# Patient Record
Sex: Male | Born: 1950 | Race: Black or African American | Hispanic: No | Smoking: Former smoker
Health system: Southern US, Community
[De-identification: ages and names within clinical notes are randomized; demographics above are authoritative.]

## PROBLEM LIST (undated history)

## (undated) DIAGNOSIS — E119 Type 2 diabetes mellitus without complications: Secondary | ICD-10-CM

## (undated) DIAGNOSIS — I2510 Atherosclerotic heart disease of native coronary artery without angina pectoris: Secondary | ICD-10-CM

## (undated) DIAGNOSIS — R519 Headache, unspecified: Secondary | ICD-10-CM

## (undated) DIAGNOSIS — R0600 Dyspnea, unspecified: Secondary | ICD-10-CM

## (undated) DIAGNOSIS — E785 Hyperlipidemia, unspecified: Secondary | ICD-10-CM

## (undated) DIAGNOSIS — I10 Essential (primary) hypertension: Secondary | ICD-10-CM

## (undated) DIAGNOSIS — J449 Chronic obstructive pulmonary disease, unspecified: Secondary | ICD-10-CM

## (undated) HISTORY — PX: COLONOSCOPY: SHX174

## (undated) HISTORY — PX: CARDIAC CATHETERIZATION: SHX172

## (undated) MED FILL — Dexamethasone Sodium Phosphate Inj 100 MG/10ML: INTRAMUSCULAR | Qty: 1 | Status: AC

---

## 2007-01-15 ENCOUNTER — Emergency Department: Payer: Self-pay | Admitting: General Practice

## 2007-04-15 ENCOUNTER — Ambulatory Visit: Payer: Self-pay | Admitting: Gastroenterology

## 2010-12-12 ENCOUNTER — Ambulatory Visit: Payer: Self-pay | Admitting: Cardiovascular Disease

## 2011-03-15 ENCOUNTER — Ambulatory Visit: Payer: Self-pay | Admitting: Family Medicine

## 2011-05-01 ENCOUNTER — Ambulatory Visit: Payer: Self-pay | Admitting: Family Medicine

## 2017-07-21 ENCOUNTER — Other Ambulatory Visit: Payer: Self-pay

## 2017-07-21 ENCOUNTER — Encounter: Payer: Self-pay | Admitting: *Deleted

## 2017-07-21 ENCOUNTER — Emergency Department
Admission: EM | Admit: 2017-07-21 | Discharge: 2017-07-21 | Disposition: A | Discharge status: Home / Self Care | Payer: BLUE CROSS/BLUE SHIELD | Attending: Emergency Medicine | Admitting: Emergency Medicine

## 2017-07-21 ENCOUNTER — Emergency Department: Payer: BLUE CROSS/BLUE SHIELD

## 2017-07-21 DIAGNOSIS — I10 Essential (primary) hypertension: Secondary | ICD-10-CM | POA: Insufficient documentation

## 2017-07-21 DIAGNOSIS — R079 Chest pain, unspecified: Secondary | ICD-10-CM | POA: Insufficient documentation

## 2017-07-21 DIAGNOSIS — F17210 Nicotine dependence, cigarettes, uncomplicated: Secondary | ICD-10-CM | POA: Insufficient documentation

## 2017-07-21 DIAGNOSIS — I2510 Atherosclerotic heart disease of native coronary artery without angina pectoris: Secondary | ICD-10-CM | POA: Insufficient documentation

## 2017-07-21 HISTORY — DX: Atherosclerotic heart disease of native coronary artery without angina pectoris: I25.10

## 2017-07-21 HISTORY — DX: Essential (primary) hypertension: I10

## 2017-07-21 LAB — BASIC METABOLIC PANEL
ANION GAP: 10 (ref 5–15)
BUN: 12 mg/dL (ref 6–20)
CHLORIDE: 99 mmol/L — AB (ref 101–111)
CO2: 25 mmol/L (ref 22–32)
Calcium: 8.9 mg/dL (ref 8.9–10.3)
Creatinine, Ser: 1.02 mg/dL (ref 0.61–1.24)
GFR calc non Af Amer: >60 mL/min (ref >60)
Glucose, Bld: 136 mg/dL — ABNORMAL HIGH (ref 65–99)
POTASSIUM: 3.5 mmol/L (ref 3.5–5.1)
SODIUM: 134 mmol/L — AB (ref 135–145)

## 2017-07-21 LAB — CBC
HEMATOCRIT: 49.3 % (ref 40.0–52.0)
Hemoglobin: 17 g/dL (ref 13.0–18.0)
MCH: 31.9 pg (ref 26.0–34.0)
MCHC: 34.4 g/dL (ref 32.0–36.0)
MCV: 92.8 fL (ref 80.0–100.0)
Platelets: 234 10*3/uL (ref 150–440)
RBC: 5.31 MIL/uL (ref 4.40–5.90)
RDW: 14.1 % (ref 11.5–14.5)
WBC: 6.2 10*3/uL (ref 3.8–10.6)

## 2017-07-21 LAB — TROPONIN I: Troponin I: <0.03 ng/mL (ref <0.03)

## 2017-07-21 MED ORDER — ASPIRIN 81 MG PO CHEW
243.0000 mg | CHEWABLE_TABLET | Freq: Once | ORAL | Status: AC
  Administered 2017-07-21: 243 mg via ORAL
  Filled 2017-07-21: qty 3

## 2017-07-21 NOTE — ED Provider Notes (Signed)
[facility] Emergency Department Provider Note  ____________________________________________   None    (approximate)  I have reviewed the triage vital signs and the nursing notes.   HISTORY  Chief Complaint Chest Pain    HPI Louis Sullivan is a 66 y.o. male with medical history as listed below who presents for evaluation of several days of intermittent chest pain.  He works in the [employer] as a Administrator and he reports that 3 days ago he started having what felt like pressure and occasional sharp pain on the left side of his chest.  It was intermittent, moderate, and nothing in particular made it worse.  It felt better in certain positions.  Took it easy over the weekend and did not move around or exert himself too much.  Today he does not feel any of the pressure but he still has some occasional sharp pains in the left lateral lower part of his ribs which he can reproduce by pressing on the area and with certain positions.  He denies fever/chills, any other chest pain, nausea, vomiting, abdominal pain, and dysuria.  He always has a mild degree of shortness of breath and cough secondary to his daily smoking and he has albuterol inhalers and a nebulizer at home.  He has a primary care doctor.  He is uncertain but he thinks his his cardiologist was Dr. Fletcher Anon and he said that he still has the business card and phone number at home but he has not been back for at least a few years.  Past Medical History:  Diagnosis Date  . Coronary artery disease    1 stent placed in about 2008  . Hypertension     There are no active problems to display for this patient.   History reviewed. No pertinent surgical history.  Prior to Admission medications   Not on File    Allergies Patient has no known allergies.  History reviewed. No pertinent family history.  Social History Social History   Tobacco Use  . Smoking status: Current Every Day Smoker   Packs/day: 1.00    Types: Cigarettes  . Smokeless tobacco: Never Used  Substance Use Topics  . Alcohol use: Yes  . Drug use: Not on file    Review of Systems Constitutional: No fever/chills Eyes: No visual changes. ENT: No sore throat. Cardiovascular: Chest pressure and some sharp pain reproducible with position and palpation times 3 days Respiratory: Mild shortness of breath at baseline Gastrointestinal: No abdominal pain.  No nausea, no vomiting.  No diarrhea.  No constipation. Genitourinary: Negative for dysuria. Musculoskeletal: Negative for neck pain.  Negative for back pain. Integumentary: Negative for rash. Neurological: Negative for headaches, focal weakness or numbness.   ____________________________________________   PHYSICAL EXAM:  VITAL SIGNS: ED Triage Vitals [07/21/17 1109]  Enc Vitals Group     BP 131/74     Pulse Rate 85     Resp 20     Temp (!) 97.5 F (36.4 C)     Temp Source Oral     SpO2 97 %     Weight 102.1 kg (225 lb)     Height 1.753 m (5\' 9" )     Head Circumference      Peak Flow      Pain Score 4     Pain Loc      Pain Edu?      Excl. in [place]?     Constitutional: Alert and oriented. Well appearing  and in no acute distress. Eyes: Conjunctivae are normal.  Head: Atraumatic. Nose: No congestion/rhinnorhea. Mouth/Throat: Mucous membranes are moist. Neck: No stridor.  No meningeal signs.   Cardiovascular: Normal rate, regular rhythm. Good peripheral circulation. Grossly normal heart sounds.  Mild reproducible left lateral lower chest wall tenderness to palpation Respiratory: Normal respiratory effort.  No retractions.  Mild to moderate expiratory wheezing that the patient states is normal for him Gastrointestinal: Soft and nontender. No distention.  Musculoskeletal: No lower extremity tenderness nor edema. No gross deformities of extremities. Neurologic:  Normal speech and language. No gross focal neurologic deficits are appreciated. Skin:   Skin is warm, dry and intact. No rash noted. Psychiatric: Mood and affect are normal. Speech and behavior are normal.  ____________________________________________   LABS (all labs ordered are listed, but only abnormal results are displayed)  Labs Reviewed  BASIC METABOLIC PANEL - Abnormal; Notable for the following components:      Result Value   Sodium 134 (*)    Chloride 99 (*)    Glucose, Bld 136 (*)    All other components within normal limits  CBC  TROPONIN I   ____________________________________________  EKG  ED ECG REPORT I, Othell Diluzio, the attending physician, personally viewed and interpreted this ECG.  Date: 07/21/2017 EKG Time: 11:05 Rate: 83 Rhythm: normal sinus rhythm QRS Axis: normal Intervals: normal ST/T Wave abnormalities: normal Narrative Interpretation: no evidence of acute ischemia  ____________________________________________  RADIOLOGY   Dg Chest 2 View  Result Date: 07/21/2017 CLINICAL DATA:  Chest pain EXAM: CHEST  2 VIEW COMPARISON:  None. FINDINGS: There is no edema or consolidation. The heart size and pulmonary vascularity are normal. No adenopathy. There is aortic atherosclerosis. There is degenerative change in each shoulder. IMPRESSION: No edema or consolidation.  There is aortic atherosclerosis. Aortic Atherosclerosis (ICD10-I70.0). Electronically Signed   By: Lowella Grip III M.D.   On: 07/21/2017 11:38    ____________________________________________   PROCEDURES  Critical Care performed: No   Procedure(s) performed:   Procedures   ____________________________________________   INITIAL IMPRESSION / ASSESSMENT AND PLAN / ED COURSE  As part of my medical decision making, I reviewed the following data within the [place] reviewed , EKG interpreted , Old chart reviewed and Radiograph reviewed     Differential diagnosis includes, but is not limited to, ACS, aortic dissection, pulmonary  embolism, cardiac tamponade, pneumothorax, pneumonia, pericarditis, myocarditis, GI-related causes including esophagitis/gastritis, and musculoskeletal chest wall pain.    HEART score places him in the moderate risk category based upon his medical history.  However his current discomfort is reproducible with palpation and is eased with certain positions.  It is not exertional and he is ambulatory without any pain or difficulty.  Overall his evaluation is reassuring.   Lab results and there is nothing concerning/emergent including his negative troponin, vital signs are stable, EKG shows no evidence of acute ischemia, and chest x-ray shows no acute abnormalities.  He had my usual and customary discussion about ACS versus chest wall pain.  I explained that we can get a second troponin but that I do not think it is absolutely necessary for him based on the duration of his symptoms and he agrees he would prefer not to wait for a repeat.  He takes a daily baby aspirin and I gave him 3 additional baby aspirin today.  He assured me he would call his cardiologist once he gets home and schedule the next available follow-up appointment.  I gave my usual and customary return precautions.  Clinical Course as of Jul 21 1401  Mon Jul 21, 2017  1323 I reviewed the patient's prescription history over the last 24 months in the multi-state controlled substances database(s) that includes [place], [place], [place], [place], [place], [place], [place], [place], [place], [place], [place], [place], [place], [place], and [place].  The patient has filled no controlled substances during that time.   [CF]    Clinical Course User Index [CF] Hinda Kehr, MD    ____________________________________________  FINAL CLINICAL IMPRESSION(S) / ED DIAGNOSES  Final diagnoses:  Chest pain, unspecified type     MEDICATIONS GIVEN DURING THIS VISIT:  Medications  aspirin chewable tablet 243  mg (243 mg Oral Given 07/21/17 1345)       Note:  This document was prepared using Dragon voice recognition software and may include unintentional dictation errors.    Hinda Kehr, MD 07/21/17 [phone]

## 2017-07-21 NOTE — ED Triage Notes (Signed)
Pt reports chest pain starting on Friday, pt reports being in bed all weekend, pt reports pain comes intermittently, pt denies any other symptoms

## 2017-07-21 NOTE — Discharge Instructions (Signed)

## 2017-07-21 NOTE — ED Notes (Signed)
AAOx3.  Skin warm and dry.  NAD 

## 2017-09-04 ENCOUNTER — Ambulatory Visit: Payer: Self-pay | Admitting: Emergency Medicine

## 2017-09-04 DIAGNOSIS — Z299 Encounter for prophylactic measures, unspecified: Secondary | ICD-10-CM

## 2017-09-04 NOTE — Progress Notes (Signed)
Patient came in to get his scheduled flu vaccine.

## 2019-03-17 NOTE — Progress Notes (Signed)
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 - We will check chest x-ray and pulmonary function test.  Dyspnea on exertion. 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  Since then he notes that his breathing has gone downhill as he has been less active. - Discussed ways in which she can try to increase his activity such as working in his yard.  Hypoxia, chronic respiratory failure. - Patient had a previous sleep study several years ago, will see if we can obtain the results. -He is currently on nocturnal oxygen at 2.5 L.  Nicotine abuse. - Currently smoking about 1/2 pack a day.  Considering quitting, but not yet ready.  Spent 3 minutes in discussion - We will discuss further at next visit. Orders Placed This Encounter  Procedures  . DG Chest 2 View  . Pulmonary Function Test [facility] Only   Meds ordered this encounter  Medications  . umeclidinium-vilanterol (ANORO ELLIPTA) 62.5-25 MCG/INH AEPB    Sig: Inhale 1 puff into the lungs daily.    Dispense:  1 each    Refill:  5  . umeclidinium-vilanterol (ANORO ELLIPTA) 62.5-25 MCG/INH AEPB    Sig: Inhale 1 puff into the lungs daily for 2 days.    Dispense:  1 each    Refill:  0  Return in about 3 months (around 06/18/2019).   Date: 03/18/2019  MRN# [id number] Louis Sullivan [birth year]    Louis Sullivan is a 68 y.o. old male seen in consultation for chief complaint of:    Chief Complaint  Patient presents with  . Consult    Referred by Louis Clement, MD. shortness of breath more in the heat when up and walkiing around, wheezing every now and then, wears 2.5 L O2 at night    HPI:  Louis Sullivan is a 68 y.o. old male, he retired from being a garbage truck driver 2 yrs ago, and notes that his breathing has gone down hill from  then. He does not exercise, also during the quarantine he is resting even more.  He has been diagnosed with COPD, he takes an albuterol mdi bid, has a neb but does not use it.  He is smoking a ppd, he has quit many times in the past, longest was 3 months about 10 years ago, he restarted after deaths in the family.   He has occasional reflux. He has no pets.  He denies runny nose. He is using oxygen at night at 2.5L. He was tested for OSA 4 or 5 years ago at sleep med, he does not recall that he needed CPAP.    **CBC 03/04/2019>> AEC 100, CO2 30 **Chest x-ray 07/21/2017>> mild interstitial prominence, otherwise unremarkable.  Slightly elevated right diaphragm.  PMHX:   Past Medical History:  Diagnosis Date  . Coronary artery disease    1 stent placed in about 2008  . Hypertension    Surgical Hx:  None noted Family Hx:  No significant contributory family history Social Hx:   Social History   Tobacco Use  . Smoking status: Current Every Day Smoker    Packs/day: 1.00    Years: 20.00    Pack years: 20.00    Types: Cigarettes  . Smokeless tobacco: Never Used  Substance Use Topics  . Alcohol use:  Yes  . Drug use: Not on file   Medication:    Current Outpatient Medications:  .  buPROPion (WELLBUTRIN XL) 150 MG 24 hr tablet, TK 1 T PO QD, Disp: , Rfl:  .  losartan-hydrochlorothiazide (HYZAAR) 50-12.5 MG tablet, TK 1 T PO QD, Disp: , Rfl:  .  metFORMIN (GLUCOPHAGE) 1000 MG tablet, TK 1 T PO BID, Disp: , Rfl:  .  rosuvastatin (CRESTOR) 40 MG tablet, TK 1 T PO QD, Disp: , Rfl:  .  VENTOLIN HFA 108 (90 Base) MCG/ACT inhaler, INL 2 PFS PO TID, Disp: , Rfl:    Allergies:  Patient has no known allergies.  Review of Systems: Gen:  Denies  fever, sweats, chills HEENT: Denies blurred vision, double vision. bleeds, sore throat Cvc:  No dizziness, chest pain. Resp:   Denies cough or sputum production, shortness of breath Gi: Denies swallowing difficulty, stomach pain. Gu:  Denies  bladder incontinence, burning urine Ext:   No Joint pain, stiffness. Skin: No skin rash,  hives  Endoc:  No polyuria, polydipsia. Psych: No depression, insomnia. Other:  All other systems were reviewed with the patient and were negative other that what is mentioned in the HPI.   Physical Examination:   VS: BP 122/62 (BP Location: Left Arm, Cuff Size: Normal)   Pulse 65   Temp 98.1 F (36.7 C) (Temporal)   Ht 5\' 7"  (1.702 m)   Wt 236 lb 9.6 oz (107.3 kg)   SpO2 94%   BMI 37.06 kg/m   General Appearance: No distress  Neuro:without focal findings,  speech normal,  HEENT: PERRLA, EOM intact.   Pulmonary: normal breath sounds, No wheezing.  CardiovascularNormal S1,S2.  No m/r/g.   Abdomen: Benign, Soft, non-tender. Renal:  No costovertebral tenderness  GU:  No performed at this time. Endoc: No evident thyromegaly, no signs of acromegaly. Skin:   warm, no rashes, no ecchymosis  Extremities: normal, no cyanosis, clubbing.  Other findings:    LABORATORY PANEL:   CBC No results for input(s): WBC, HGB, HCT, PLT in the last 168 hours. ------------------------------------------------------------------------------------------------------------------  Chemistries  No results for input(s): NA, K, CL, CO2, GLUCOSE, BUN, CREATININE, CALCIUM, MG, AST, ALT, ALKPHOS, BILITOT in the last 168 hours.  Invalid input(s): GFRCGP ------------------------------------------------------------------------------------------------------------------  Cardiac Enzymes No results for input(s): TROPONINI in the last 168 hours. ------------------------------------------------------------  RADIOLOGY:  No results found.     Thank  you for the consultation and for allowing [facility] to assist in the care of your patient. Our recommendations are noted above.  Please contact us if we can be of further service.   Marda Stalker, M.D., F.C.C.P.  Board Certified in Internal  Medicine, Pulmonary Medicine, [place], and Sleep Medicine.  [facility] Office Number: [phone]   03/18/2019

## 2019-03-18 ENCOUNTER — Ambulatory Visit (INDEPENDENT_AMBULATORY_CARE_PROVIDER_SITE_OTHER): Payer: Medicare Other | Admitting: Internal Medicine

## 2019-03-18 ENCOUNTER — Encounter: Payer: Self-pay | Admitting: Internal Medicine

## 2019-03-18 ENCOUNTER — Other Ambulatory Visit: Payer: Self-pay

## 2019-03-18 VITALS — BP 122/62 | HR 65 | Temp 98.1°F | Ht 67.0 in | Wt 236.6 lb

## 2019-03-18 DIAGNOSIS — J449 Chronic obstructive pulmonary disease, unspecified: Secondary | ICD-10-CM

## 2019-03-18 DIAGNOSIS — F17210 Nicotine dependence, cigarettes, uncomplicated: Secondary | ICD-10-CM

## 2019-03-18 DIAGNOSIS — R0609 Other forms of dyspnea: Secondary | ICD-10-CM

## 2019-03-18 MED ORDER — ANORO ELLIPTA 62.5-25 MCG/INH IN AEPB: 1.0000 | INHALATION_SPRAY | Freq: Every day | RESPIRATORY_TRACT | 0 refills | Status: AC

## 2019-03-18 MED ORDER — ANORO ELLIPTA 62.5-25 MCG/INH IN AEPB: 1.0000 | INHALATION_SPRAY | Freq: Every day | RESPIRATORY_TRACT | 5 refills | Status: DC

## 2019-03-18 NOTE — Patient Instructions (Addendum)
Will start Anoro inhaler once daily.  Will send for lung function test, chest x ray.  Try to increase your activity.   --Quitting smoking is the most important thing that you can do for your health.  --Quitting smoking will have greater affect on your health than any medicine that we can give you.

## 2019-04-09 ENCOUNTER — Other Ambulatory Visit: Payer: Self-pay

## 2019-04-16 ENCOUNTER — Other Ambulatory Visit: Payer: Self-pay

## 2019-04-22 ENCOUNTER — Telehealth: Payer: Self-pay | Admitting: Internal Medicine

## 2019-04-22 ENCOUNTER — Other Ambulatory Visit: Payer: Self-pay

## 2019-04-22 NOTE — Telephone Encounter (Signed)
Left message to make pt aware of date/time of covid test.  04/26/2019 [facility]

## 2019-04-23 ENCOUNTER — Other Ambulatory Visit: Payer: Self-pay

## 2019-04-23 NOTE — Telephone Encounter (Signed)
Pt is aware of time/date for covid test.  Nothing further is needed.

## 2019-04-26 ENCOUNTER — Other Ambulatory Visit: Payer: Self-pay

## 2019-04-26 ENCOUNTER — Other Ambulatory Visit
Admission: RE | Admit: 2019-04-26 | Discharge: 2019-04-26 | Disposition: A | Discharge status: Home / Self Care | Payer: Medicare Other | Source: Ambulatory Visit | Attending: Internal Medicine | Admitting: Internal Medicine

## 2019-04-26 DIAGNOSIS — Z20828 Contact with and (suspected) exposure to other viral communicable diseases: Secondary | ICD-10-CM | POA: Diagnosis not present

## 2019-04-26 DIAGNOSIS — Z01812 Encounter for preprocedural laboratory examination: Secondary | ICD-10-CM | POA: Diagnosis present

## 2019-04-27 LAB — SARS CORONAVIRUS 2 (TAT 6-24 HRS): SARS Coronavirus 2: NEGATIVE

## 2019-04-29 ENCOUNTER — Ambulatory Visit
Admission: RE | Admit: 2019-04-29 | Discharge: 2019-04-29 | Disposition: A | Discharge status: Home / Self Care | Payer: Medicare Other | Source: Ambulatory Visit | Attending: Internal Medicine | Admitting: Internal Medicine

## 2019-04-29 ENCOUNTER — Other Ambulatory Visit: Payer: Self-pay

## 2019-04-29 ENCOUNTER — Ambulatory Visit (HOSPITAL_COMMUNITY): Payer: Medicare Other

## 2019-04-29 DIAGNOSIS — R0609 Other forms of dyspnea: Secondary | ICD-10-CM | POA: Insufficient documentation

## 2019-04-29 DIAGNOSIS — J449 Chronic obstructive pulmonary disease, unspecified: Secondary | ICD-10-CM | POA: Insufficient documentation

## 2019-04-29 MED ORDER — ALBUTEROL SULFATE (2.5 MG/3ML) 0.083% IN NEBU
2.5000 mg | INHALATION_SOLUTION | Freq: Once | RESPIRATORY_TRACT | Status: AC
  Administered 2019-04-29: 2.5 mg via RESPIRATORY_TRACT
  Filled 2019-04-29: qty 3

## 2021-11-22 ENCOUNTER — Inpatient Hospital Stay
Admission: EM | Admit: 2021-11-22 | Discharge: 2021-11-27 | DRG: 193 | Disposition: A | Discharge status: Home / Self Care | Payer: Medicare Other | Attending: Internal Medicine | Admitting: Internal Medicine

## 2021-11-22 ENCOUNTER — Emergency Department: Payer: Medicare Other

## 2021-11-22 ENCOUNTER — Encounter: Payer: Self-pay | Admitting: *Deleted

## 2021-11-22 ENCOUNTER — Other Ambulatory Visit: Payer: Self-pay

## 2021-11-22 DIAGNOSIS — Z7984 Long term (current) use of oral hypoglycemic drugs: Secondary | ICD-10-CM

## 2021-11-22 DIAGNOSIS — Z20822 Contact with and (suspected) exposure to covid-19: Secondary | ICD-10-CM | POA: Diagnosis present

## 2021-11-22 DIAGNOSIS — Z79899 Other long term (current) drug therapy: Secondary | ICD-10-CM

## 2021-11-22 DIAGNOSIS — J441 Chronic obstructive pulmonary disease with (acute) exacerbation: Secondary | ICD-10-CM | POA: Diagnosis not present

## 2021-11-22 DIAGNOSIS — I10 Essential (primary) hypertension: Secondary | ICD-10-CM | POA: Diagnosis present

## 2021-11-22 DIAGNOSIS — D72828 Other elevated white blood cell count: Secondary | ICD-10-CM | POA: Diagnosis present

## 2021-11-22 DIAGNOSIS — Z955 Presence of coronary angioplasty implant and graft: Secondary | ICD-10-CM

## 2021-11-22 DIAGNOSIS — Z7951 Long term (current) use of inhaled steroids: Secondary | ICD-10-CM

## 2021-11-22 DIAGNOSIS — Z23 Encounter for immunization: Secondary | ICD-10-CM

## 2021-11-22 DIAGNOSIS — J9621 Acute and chronic respiratory failure with hypoxia: Secondary | ICD-10-CM | POA: Diagnosis present

## 2021-11-22 DIAGNOSIS — E785 Hyperlipidemia, unspecified: Secondary | ICD-10-CM | POA: Diagnosis present

## 2021-11-22 DIAGNOSIS — J440 Chronic obstructive pulmonary disease with acute lower respiratory infection: Secondary | ICD-10-CM | POA: Diagnosis present

## 2021-11-22 DIAGNOSIS — I2510 Atherosclerotic heart disease of native coronary artery without angina pectoris: Secondary | ICD-10-CM | POA: Diagnosis present

## 2021-11-22 DIAGNOSIS — J189 Pneumonia, unspecified organism: Secondary | ICD-10-CM | POA: Diagnosis not present

## 2021-11-22 DIAGNOSIS — F17210 Nicotine dependence, cigarettes, uncomplicated: Secondary | ICD-10-CM | POA: Diagnosis present

## 2021-11-22 DIAGNOSIS — E119 Type 2 diabetes mellitus without complications: Secondary | ICD-10-CM

## 2021-11-22 DIAGNOSIS — R0602 Shortness of breath: Secondary | ICD-10-CM

## 2021-11-22 DIAGNOSIS — J9601 Acute respiratory failure with hypoxia: Secondary | ICD-10-CM

## 2021-11-22 LAB — CBC
HCT: 39.7 % (ref 39.0–52.0)
Hemoglobin: 11.8 g/dL — ABNORMAL LOW (ref 13.0–17.0)
MCH: 29 pg (ref 26.0–34.0)
MCHC: 29.7 g/dL — ABNORMAL LOW (ref 30.0–36.0)
MCV: 97.5 fL (ref 80.0–100.0)
Platelets: 196 10*3/uL (ref 150–400)
RBC: 4.07 MIL/uL — ABNORMAL LOW (ref 4.22–5.81)
RDW: 12.9 % (ref 11.5–15.5)
WBC: 13.1 10*3/uL — ABNORMAL HIGH (ref 4.0–10.5)
nRBC: 0 % (ref 0.0–0.2)

## 2021-11-22 LAB — BASIC METABOLIC PANEL
Anion gap: 11 (ref 5–15)
BUN: 13 mg/dL (ref 8–23)
CO2: 26 mmol/L (ref 22–32)
Calcium: 8.9 mg/dL (ref 8.9–10.3)
Chloride: 99 mmol/L (ref 98–111)
Creatinine, Ser: 0.9 mg/dL (ref 0.61–1.24)
GFR, Estimated: >60 mL/min (ref >60)
Glucose, Bld: 111 mg/dL — ABNORMAL HIGH (ref 70–99)
Potassium: 3.8 mmol/L (ref 3.5–5.1)
Sodium: 136 mmol/L (ref 135–145)

## 2021-11-22 LAB — RESP PANEL BY RT-PCR (FLU A&B, COVID) ARPGX2
Influenza A by PCR: NEGATIVE
Influenza B by PCR: NEGATIVE
SARS Coronavirus 2 by RT PCR: NEGATIVE

## 2021-11-22 LAB — BLOOD GAS, VENOUS
Acid-Base Excess: 5.6 mmol/L — ABNORMAL HIGH (ref 0.0–2.0)
Bicarbonate: 32.1 mmol/L — ABNORMAL HIGH (ref 20.0–28.0)
O2 Saturation: 91.4 %
Patient temperature: 37
pCO2, Ven: 53 mmHg (ref 44–60)
pH, Ven: 7.39 (ref 7.25–7.43)
pO2, Ven: 59 mmHg — ABNORMAL HIGH (ref 32–45)

## 2021-11-22 LAB — TROPONIN I (HIGH SENSITIVITY)
Troponin I (High Sensitivity): 10 ng/L (ref <18)
Troponin I (High Sensitivity): 12 ng/L (ref <18)

## 2021-11-22 LAB — GLUCOSE, CAPILLARY: Glucose-Capillary: 218 mg/dL — ABNORMAL HIGH (ref 70–99)

## 2021-11-22 MED ORDER — SODIUM CHLORIDE 0.9 % IV SOLN
1.0000 g | Freq: Once | INTRAVENOUS | Status: AC
  Administered 2021-11-22: 18:00:00 1 g via INTRAVENOUS
  Filled 2021-11-22: qty 10

## 2021-11-22 MED ORDER — ENOXAPARIN SODIUM 40 MG/0.4ML IJ SOSY
40.0000 mg | PREFILLED_SYRINGE | INTRAMUSCULAR | Status: DC
  Administered 2021-11-22 – 2021-11-26 (×5): 40 mg via SUBCUTANEOUS
  Filled 2021-11-22 (×5): qty 0.4

## 2021-11-22 MED ORDER — LOSARTAN POTASSIUM-HCTZ 50-12.5 MG PO TABS: 1.0000 | ORAL_TABLET | Freq: Every day | ORAL | Status: DC

## 2021-11-22 MED ORDER — SODIUM CHLORIDE 0.9 % IV SOLN
500.0000 mg | Freq: Once | INTRAVENOUS | Status: AC
  Administered 2021-11-22: 18:00:00 500 mg via INTRAVENOUS
  Filled 2021-11-22: qty 5

## 2021-11-22 MED ORDER — HYDRALAZINE HCL 25 MG PO TABS
25.0000 mg | ORAL_TABLET | Freq: Four times a day (QID) | ORAL | Status: DC | PRN
  Administered 2021-11-22: 20:00:00 25 mg via ORAL
  Filled 2021-11-22: qty 1

## 2021-11-22 MED ORDER — GUAIFENESIN ER 600 MG PO TB12
1200.0000 mg | ORAL_TABLET | Freq: Two times a day (BID) | ORAL | Status: DC
  Administered 2021-11-22 – 2021-11-27 (×10): 1200 mg via ORAL
  Filled 2021-11-22 (×10): qty 2

## 2021-11-22 MED ORDER — SODIUM CHLORIDE 0.9 % IV SOLN
2.0000 g | INTRAVENOUS | Status: DC
  Administered 2021-11-23 – 2021-11-26 (×4): 2 g via INTRAVENOUS
  Filled 2021-11-22: qty 2
  Filled 2021-11-22: qty 20
  Filled 2021-11-22: qty 2
  Filled 2021-11-22 (×2): qty 20

## 2021-11-22 MED ORDER — METHYLPREDNISOLONE SODIUM SUCC 125 MG IJ SOLR
125.0000 mg | Freq: Once | INTRAMUSCULAR | Status: AC
  Administered 2021-11-22: 17:00:00 125 mg via INTRAVENOUS
  Filled 2021-11-22: qty 2

## 2021-11-22 MED ORDER — BUPROPION HCL ER (XL) 150 MG PO TB24: 150.0000 mg | ORAL_TABLET | Freq: Every day | ORAL | Status: DC

## 2021-11-22 MED ORDER — METFORMIN HCL 500 MG PO TABS
1000.0000 mg | ORAL_TABLET | Freq: Two times a day (BID) | ORAL | Status: DC
  Administered 2021-11-23 – 2021-11-27 (×9): 1000 mg via ORAL
  Filled 2021-11-22 (×9): qty 2

## 2021-11-22 MED ORDER — ALBUTEROL SULFATE (2.5 MG/3ML) 0.083% IN NEBU: 2.5000 mg | INHALATION_SOLUTION | Freq: Four times a day (QID) | RESPIRATORY_TRACT | Status: DC | PRN

## 2021-11-22 MED ORDER — PNEUMOCOCCAL 20-VAL CONJ VACC 0.5 ML IM SUSY
0.5000 mL | PREFILLED_SYRINGE | INTRAMUSCULAR | Status: AC
  Administered 2021-11-24: 0.5 mL via INTRAMUSCULAR
  Filled 2021-11-22: qty 0.5

## 2021-11-22 MED ORDER — IPRATROPIUM-ALBUTEROL 0.5-2.5 (3) MG/3ML IN SOLN
3.0000 mL | Freq: Once | RESPIRATORY_TRACT | Status: AC
  Administered 2021-11-22: 17:00:00 3 mL via RESPIRATORY_TRACT
  Filled 2021-11-22: qty 3

## 2021-11-22 MED ORDER — IBUPROFEN 400 MG PO TABS: 600.0000 mg | ORAL_TABLET | Freq: Four times a day (QID) | ORAL | Status: AC | PRN

## 2021-11-22 MED ORDER — LIDOCAINE 5 % EX PTCH
1.0000 | MEDICATED_PATCH | CUTANEOUS | Status: DC
  Administered 2021-11-22 – 2021-11-26 (×5): 1 via TRANSDERMAL
  Filled 2021-11-22 (×6): qty 1

## 2021-11-22 MED ORDER — ROSUVASTATIN CALCIUM 20 MG PO TABS: 40.0000 mg | ORAL_TABLET | Freq: Every day | ORAL | Status: DC

## 2021-11-22 MED ORDER — INSULIN ASPART 100 UNIT/ML IJ SOLN
0.0000 [IU] | Freq: Every day | INTRAMUSCULAR | Status: DC
  Administered 2021-11-22: 22:00:00 2 [IU] via SUBCUTANEOUS
  Filled 2021-11-22: qty 1

## 2021-11-22 MED ORDER — PREDNISONE 20 MG PO TABS: 40.0000 mg | ORAL_TABLET | Freq: Every day | ORAL | Status: DC

## 2021-11-22 MED ORDER — ASPIRIN EC 81 MG PO TBEC
81.0000 mg | DELAYED_RELEASE_TABLET | Freq: Every day | ORAL | Status: DC
  Administered 2021-11-22 – 2021-11-27 (×6): 81 mg via ORAL
  Filled 2021-11-22 (×6): qty 1

## 2021-11-22 MED ORDER — BENZONATATE 100 MG PO CAPS
200.0000 mg | ORAL_CAPSULE | Freq: Three times a day (TID) | ORAL | Status: DC
  Administered 2021-11-22 – 2021-11-27 (×14): 200 mg via ORAL
  Filled 2021-11-22 (×14): qty 2

## 2021-11-22 MED ORDER — UMECLIDINIUM-VILANTEROL 62.5-25 MCG/ACT IN AEPB: 1.0000 | INHALATION_SPRAY | Freq: Every day | RESPIRATORY_TRACT | Status: DC

## 2021-11-22 MED ORDER — ALBUTEROL SULFATE HFA 108 (90 BASE) MCG/ACT IN AERS: 1.0000 | INHALATION_SPRAY | Freq: Four times a day (QID) | RESPIRATORY_TRACT | Status: DC | PRN

## 2021-11-22 MED ORDER — ACETAMINOPHEN 325 MG PO TABS: 650.0000 mg | ORAL_TABLET | Freq: Four times a day (QID) | ORAL | Status: DC | PRN

## 2021-11-22 MED ORDER — LOSARTAN POTASSIUM 50 MG PO TABS
50.0000 mg | ORAL_TABLET | Freq: Every day | ORAL | Status: DC
  Administered 2021-11-22 – 2021-11-27 (×6): 50 mg via ORAL
  Filled 2021-11-22 (×6): qty 1

## 2021-11-22 MED ORDER — NICOTINE 21 MG/24HR TD PT24
21.0000 mg | MEDICATED_PATCH | Freq: Every day | TRANSDERMAL | Status: DC
  Administered 2021-11-22 – 2021-11-27 (×6): 21 mg via TRANSDERMAL
  Filled 2021-11-22 (×6): qty 1

## 2021-11-22 MED ORDER — AZITHROMYCIN 250 MG PO TABS
500.0000 mg | ORAL_TABLET | Freq: Every day | ORAL | Status: AC
  Administered 2021-11-23 – 2021-11-27 (×5): 500 mg via ORAL
  Filled 2021-11-22 (×5): qty 2

## 2021-11-22 MED ORDER — INSULIN ASPART 100 UNIT/ML IJ SOLN
0.0000 [IU] | Freq: Three times a day (TID) | INTRAMUSCULAR | Status: DC
  Administered 2021-11-23: 2 [IU] via SUBCUTANEOUS
  Administered 2021-11-24: 3 [IU] via SUBCUTANEOUS
  Administered 2021-11-26: 2 [IU] via SUBCUTANEOUS
  Filled 2021-11-22 (×3): qty 1

## 2021-11-22 NOTE — ED Notes (Signed)
Pt has chest pain and sob.  Sx for several days   worse today.  Cig smoker.  Pt has a cough  no fever   pt had covid approx 4 months ago.  Pt alert  speech clear  md at bedside  iv in place.  ?

## 2021-11-22 NOTE — ED Provider Notes (Signed)
[facility] Provider Note   Event Date/Time   First MD Initiated Contact with Patient 11/22/21 1659     (approximate) History  Chest Pain  HPI Louis Sullivan is a 71 y.o. male with a stated past medical history of COPD who presents for worsening shortness of breath with productive cough.  Patient states that symptoms began to worsen today, EMS was called to his long-term care facility for transport to emergency department.  Patient also endorses subjective fevers.  Patient denies any chest pain, nausea/vomiting/diarrhea, abdominal pain, or weakness/numbness/paresthesias in extremities. Physical Exam  Triage Vital Signs: ED Triage Vitals  Enc Vitals Group     BP 11/22/21 1655 (!) 181/81     Pulse Rate 11/22/21 1658 93     Resp 11/22/21 1659 (!) 33     Temp 11/22/21 1700 98.2 F (36.8 C)     Temp Source 11/22/21 1700 Oral     SpO2 11/22/21 1658 (!) 89 %     Weight 11/22/21 1655 200 lb (90.7 kg)     Height 11/22/21 1654 5\' 7"  (1.702 m)     Head Circumference --      Peak Flow --      Pain Score --      Pain Loc --      Pain Edu? --      Excl. in [place]? --    Most recent vital signs: Vitals:   11/22/21 1845 11/22/21 1900  BP:  (!) 181/85  Pulse: 98 91  Resp: (!) 29 (!) 28  Temp:    SpO2: 95% 96%   General: Awake, oriented x4. CV:  Good peripheral perfusion.  Resp:  Increased effort.  Rhonchi over left lung fields Abd:  No distention.  Other:  Elderly overweight African-American male laying in bed in mild respiratory distress ED Results / Procedures / Treatments  Labs (all labs ordered are listed, but only abnormal results are displayed) Labs Reviewed  BASIC METABOLIC PANEL - Abnormal; Notable for the following components:      Result Value   Glucose, Bld 111 (*)    All other components within normal limits  CBC - Abnormal; Notable for the following components:   WBC 13.1 (*)    RBC 4.07 (*)    Hemoglobin 11.8 (*)    MCHC 29.7 (*)    All  other components within normal limits  BLOOD GAS, VENOUS - Abnormal; Notable for the following components:   pO2, Ven 59 (*)    Bicarbonate 32.1 (*)    Acid-Base Excess 5.6 (*)    All other components within normal limits  RESP PANEL BY RT-PCR (FLU A&B, COVID) ARPGX2  CULTURE, BLOOD (ROUTINE X 2)  CULTURE, BLOOD (ROUTINE X 2)  EXPECTORATED SPUTUM ASSESSMENT W GRAM STAIN, RFLX TO RESP C  HIV ANTIBODY (ROUTINE TESTING W REFLEX)  LEGIONELLA PNEUMOPHILA SEROGP 1 UR AG  STREP PNEUMONIAE URINARY ANTIGEN  HEMOGLOBIN A1C  CBC  TROPONIN I (HIGH SENSITIVITY)  TROPONIN I (HIGH SENSITIVITY)   EKG ED ECG REPORT I, Naaman Plummer, the attending physician, personally viewed and interpreted this ECG. Date: 11/22/2021 EKG Time: 1700 Rate: 96 Rhythm: normal sinus rhythm QRS Axis: normal Intervals: normal ST/T Wave abnormalities: normal Narrative Interpretation: no evidence of acute ischemia RADIOLOGY ED MD interpretation: Single view portable chest x-ray as interpreted by me shows patchy consolidation groundglass opacities in the left mid to lower lung suspicious for pneumonia -Agree with radiology assessment Official radiology report(s): DG Chest  Port 1 View  Result Date: 11/22/2021 CLINICAL DATA:  Chest pain EXAM: PORTABLE CHEST 1 VIEW COMPARISON:  04/29/2019 FINDINGS: Patchy consolidation and ground-glass opacity in the left mid to lower lung. No pleural effusion. Normal cardiac size. No pneumothorax IMPRESSION: Patchy consolidation and ground-glass opacity in the left mid to lower lung suspicious for pneumonia. Radiographic follow-up to resolution is recommended. Electronically Signed   By: Donavan Foil M.D.   On: 11/22/2021 17:57   PROCEDURES: Critical Care performed: Yes, see critical care procedure note(s) .1-3 Lead EKG Interpretation Performed by: Naaman Plummer, MD Authorized by: Naaman Plummer, MD     Interpretation: normal     ECG rate:  90   ECG rate assessment: normal      Rhythm: sinus rhythm     Ectopy: none     Conduction: normal   CRITICAL CARE Performed by: Naaman Plummer  Total critical care time: 31 minutes  Critical care time was exclusive of separately billable procedures and treating other patients.  Critical care was necessary to treat or prevent imminent or life-threatening deterioration.  Critical care was time spent personally by me on the following activities: development of treatment plan with patient and/or surrogate as well as nursing, discussions with consultants, evaluation of patient's response to treatment, examination of patient, obtaining history from patient or surrogate, ordering and performing treatments and interventions, ordering and review of laboratory studies, ordering and review of radiographic studies, pulse oximetry and re-evaluation of patient's condition.  MEDICATIONS ORDERED IN ED: Medications  buPROPion (WELLBUTRIN XL) 24 hr tablet 150 mg (has no administration in time range)  metFORMIN (GLUCOPHAGE) tablet 1,000 mg (has no administration in time range)  umeclidinium-vilanterol (ANORO ELLIPTA) 62.5-25 MCG/ACT 1 puff (has no administration in time range)  enoxaparin (LOVENOX) injection 40 mg (has no administration in time range)  hydrALAZINE (APRESOLINE) tablet 25 mg (has no administration in time range)  cefTRIAXone (ROCEPHIN) 2 g in sodium chloride 0.9 % 100 mL IVPB (has no administration in time range)  azithromycin (ZITHROMAX) tablet 500 mg (has no administration in time range)  nicotine (NICODERM CQ - dosed in mg/24 hours) patch 21 mg (has no administration in time range)  insulin aspart (novoLOG) injection 0-15 Units (has no administration in time range)  insulin aspart (novoLOG) injection 0-5 Units (has no administration in time range)  predniSONE (DELTASONE) tablet 40 mg (has no administration in time range)  guaiFENesin (MUCINEX) 12 hr tablet 1,200 mg (has no administration in time range)  benzonatate (TESSALON)  capsule 200 mg (has no administration in time range)  aspirin EC tablet 81 mg (has no administration in time range)  acetaminophen (TYLENOL) tablet 650 mg (has no administration in time range)  lidocaine (LIDODERM) 5 % 1 patch (has no administration in time range)  ibuprofen (ADVIL) tablet 600 mg (has no administration in time range)  albuterol (PROVENTIL) (2.5 MG/3ML) 0.083% nebulizer solution 2.5 mg (has no administration in time range)  losartan (COZAAR) tablet 50 mg (has no administration in time range)  ipratropium-albuterol (DUONEB) 0.5-2.5 (3) MG/3ML nebulizer solution 3 mL (3 mLs Nebulization Given 11/22/21 1719)  methylPREDNISolone sodium succinate (SOLU-MEDROL) 125 mg/2 mL injection 125 mg (125 mg Intravenous Given 11/22/21 1719)  cefTRIAXone (ROCEPHIN) 1 g in sodium chloride 0.9 % 100 mL IVPB (0 g Intravenous Stopped 11/22/21 1859)  azithromycin (ZITHROMAX) 500 mg in sodium chloride 0.9 % 250 mL IVPB (500 mg Intravenous New Bag/Given 11/22/21 1828)   IMPRESSION / MDM / ASSESSMENT AND PLAN /  ED COURSE  I reviewed the triage vital signs and the nursing notes.                             The patient is on the cardiac monitor to evaluate for evidence of arrhythmia and/or significant heart rate changes. Presents with shortness of breath, cough, and malaise concerning for pneumonia.  DDx: PE, COPD exacerbation, Pneumothorax, TB, Atypical ACS, Esophageal Rupture, Toxic Exposure, Foreign Body Airway Obstruction.  Workup: CXR CBC, CMP, lactate, troponin  Given History, Exam, and Workup presentation most consistent with pneumonia.  Findings: Chest x-ray showing left mid/lower lung opacity concerning for pneumonia CBC showing leukocytosis of 13  Tx: Ceftriaxone 1g IV Azithromycin 500mg  IV  1857 Reassessment: As patient is continuing to require supplemental oxygenation for acute hypoxic respiratory failure, patient will require admission to the internal medicine service for further  evaluation and management  Disposition: Admit    FINAL CLINICAL IMPRESSION(S) / ED DIAGNOSES   Final diagnoses:  Community acquired pneumonia of left lower lobe of lung  Acute respiratory failure with hypoxia ([place])   Rx / DC Orders   ED Discharge Orders     None      Note:  This document was prepared using Dragon voice recognition software and may include unintentional dictation errors.   Naaman Plummer, MD 11/22/21 [phone]

## 2021-11-22 NOTE — Plan of Care (Signed)
?  Problem: Activity: ?Goal: Ability to tolerate increased activity will improve ?Outcome: Progressing ?  ?Problem: Clinical Measurements: ?Goal: Ability to maintain a body temperature in the normal range will improve ?Outcome: Progressing ?  ?Problem: Respiratory: ?Goal: Ability to maintain a clear airway will improve ?Outcome: Progressing ?  ?

## 2021-11-22 NOTE — ED Notes (Signed)
Pt up to bathroom  pt placed on 2 liters oxygen.  Pt is on 2 liters at night at home.  Pt alert.   ?

## 2021-11-22 NOTE — ED Triage Notes (Addendum)
[facility] with [facility].  Ain began today.  Hx covid 92months ago.  Cig smoker.  Pt alert  speech clear.  ?

## 2021-11-22 NOTE — H&P (Signed)
?History and Physical  ? ? ?Louis Sullivan DOB: 1951/09/10 DOA: 11/22/2021 ? ?PCP: [place] (Confirm with patient/family/NH records and if not entered, this has to be entered at [facility] point of entry) ?Patient coming from: Home ? ?I have personally briefly reviewed patient's old medical records in [place] ? ?Chief Complaint: Cough, SOB and chest pain ? ?HPI: Louis Sullivan is a 71 y.o. male with medical history significant of COPD Gold stage II 2020, chronic hypoxic respite failure on 2 L at bedtime, HTN, IIDM, CAD s/p stenting in 2008, cigarette smoking, presented with worsening of cough, shortness of breath and wheezing. ? ?Symptoms started over the weekend, patient did some gardening in the morning, he thought he might contact pollen, and in the morning evening he started to have severe runny nose and postnasal drip.  Overnight he developed cough and wheezing, and cough has been productive with yellowish sputum.  And has been using nebulizer around-the-clock for last 2 days with minimal improvement.  He had episode of chills but no fever.  This morning, his wheezing and cough has become worse and he started to feel left-sided sharp-like chest pain worsening with cough and deep breath. ? ?ED Course: Patient was found to be very tachypneic and tachycardia.  Blood pressure elevated, O2 saturation 98% on 2 L. ? ?Chest x-ray showed left mid to lower lobe consolidation. ? ?Patient was given IV Solu-Medrol x1, ceftriaxone and azithromycin. ? ?Review of Systems: As per HPI otherwise 14 point review of systems negative.  ? ? ?Past Medical History:  ?Diagnosis Date  ? Coronary artery disease   ? 1 stent placed in about 2008  ? Hypertension   ? ? ?No past surgical history on file. ? ? reports that he has been smoking cigarettes. He has a 20.00 pack-year smoking history. He has never used smokeless tobacco. He reports current alcohol use. No history on file for drug use. ? ?No Known  Allergies ? ?No family history on file. ? ? ?Prior to Admission medications   ?Medication Sig Start Date End Date Taking? Authorizing Provider  ?buPROPion (WELLBUTRIN XL) 150 MG 24 hr tablet TK 1 T PO QD 03/04/19   [provider]  ?losartan-hydrochlorothiazide (HYZAAR) 50-12.5 MG tablet TK 1 T PO QD 03/04/19   [provider]  ?metFORMIN (GLUCOPHAGE) 1000 MG tablet TK 1 T PO BID 03/11/19   [provider]  ?rosuvastatin (CRESTOR) 40 MG tablet TK 1 T PO QD 03/04/19   [provider]  ?umeclidinium-vilanterol (ANORO ELLIPTA) 62.5-25 MCG/INH AEPB Inhale 1 puff into the lungs daily. 03/18/19   Laverle Hobby, MD  ?VENTOLIN HFA 108 (90 Base) MCG/ACT inhaler INL 2 PFS PO TID 03/04/19   [provider]  ? ? ?Physical Exam: ?Vitals:  ? 11/22/21 1819 11/22/21 1830 11/22/21 1833 11/22/21 1834  ?BP:   (!) 171/84   ?Pulse:  92  (!) 109  ?Resp: (!) 31 (!) 30 (!) 22 (!) 26  ?Temp:      ?TempSrc:      ?SpO2:  90%  100%  ?Weight:      ?Height:      ? ? ?Constitutional: NAD, calm, comfortable ?Vitals:  ? 11/22/21 1819 11/22/21 1830 11/22/21 1833 11/22/21 1834  ?BP:   (!) 171/84   ?Pulse:  92  (!) 109  ?Resp: (!) 31 (!) 30 (!) 22 (!) 26  ?Temp:      ?TempSrc:      ?SpO2:  90%  100%  ?Weight:      ?Height:      ? ?Eyes: PERRL, lids and conjunctivae normal ?ENMT: Mucous membranes are moist. Posterior pharynx clear of any exudate or lesions.Normal dentition.  ?Neck: normal, supple, no masses, no thyromegaly ?Respiratory: Diminished breathing sound bilaterally, diffused wheezing and scattered crackles bilaterally left> right, increased breathing effort, talking in broken sentences. No accessory muscle use.  ?Cardiovascular: Regular rate and rhythm, no murmurs / rubs / gallops. No extremity edema. 2+ pedal pulses. No carotid bruits.  ?Abdomen: no tenderness, no masses palpated. No hepatosplenomegaly. Bowel sounds positive.  ?Musculoskeletal: no clubbing / cyanosis. No joint deformity upper and  lower extremities. Good ROM, no contractures. Normal muscle tone.  ?Skin: no rashes, lesions, ulcers. No induration ?Neurologic: CN 2-12 grossly intact. Sensation intact, DTR normal. Strength 5/5 in all 4.  ?Psychiatric: Normal judgment and insight. Alert and oriented x 3. Normal mood.  ? ? ?Labs on Admission: I have personally reviewed following labs and imaging studies ? ?CBC: ?Recent Labs  ?Lab 11/22/21 ?1702  ?WBC 13.1*  ?HGB 11.8*  ?HCT 39.7  ?MCV 97.5  ?PLT 196  ? ?Basic Metabolic Panel: ?Recent Labs  ?Lab 11/22/21 ?1702  ?NA 136  ?K 3.8  ?CL 99  ?CO2 26  ?GLUCOSE 111*  ?BUN 13  ?CREATININE 0.90  ?CALCIUM 8.9  ? ?GFR: ?Estimated Creatinine Clearance: 82 mL/min (by C-G formula based on SCr of 0.9 mg/dL). ?Liver Function Tests: ?No results for input(s): AST, ALT, ALKPHOS, BILITOT, PROT, ALBUMIN in the last 168 hours. ?No results for input(s): LIPASE, AMYLASE in the last 168 hours. ?No results for input(s): AMMONIA in the last 168 hours. ?Coagulation Profile: ?No results for input(s): INR, PROTIME in the last 168 hours. ?Cardiac Enzymes: ?No results for input(s): CKTOTAL, CKMB, CKMBINDEX, TROPONINI in the last 168 hours. ?BNP (last 3 results) ?No results for input(s): PROBNP in the last 8760 hours. ?HbA1C: ?No results for input(s): HGBA1C in the last 72 hours. ?CBG: ?No results for input(s): GLUCAP in the last 168 hours. ?Lipid Profile: ?No results for input(s): CHOL, HDL, LDLCALC, TRIG, CHOLHDL, LDLDIRECT in the last 72 hours. ?Thyroid Function Tests: ?No results for input(s): TSH, T4TOTAL, FREET4, T3FREE, THYROIDAB in the last 72 hours. ?Anemia Panel: ?No results for input(s): VITAMINB12, FOLATE, FERRITIN, TIBC, IRON, RETICCTPCT in the last 72 hours. ?Urine analysis: ?No results found for: COLORURINE, APPEARANCEUR, [place], [place], [place], [place], [place], KETONESUR, PROTEINUR, [place], NITRITE, LEUKOCYTESUR ? ?Radiological Exams on Admission: ?DG Chest Port 1 View ? ?Result Date: 11/22/2021 ?CLINICAL  DATA:  Chest pain EXAM: PORTABLE CHEST 1 VIEW COMPARISON:  04/29/2019 FINDINGS: Patchy consolidation and ground-glass opacity in the left mid to lower lung. No pleural effusion. Normal cardiac size. No pneumothorax IMPRESSION: Patchy consolidation and ground-glass opacity in the left mid to lower lung suspicious for pneumonia. Radiographic follow-up to resolution is recommended. Electronically Signed   By: Donavan Foil M.D.   On: 11/22/2021 17:57   ? ?EKG: Independently reviewed. Sinus, LVH, no acute ST changes. ? ?Assessment/Plan ?Principal Problem: ?  Pneumonia ?Active Problems: ?  COPD with acute exacerbation ([place]) ?  HTN (hypertension) ?  Controlled type 2 diabetes mellitus without complication, without long-term current use of insulin ([place]) ? (please populate well all problems here in Problem List. (For example, if patient is on BP meds at home and you resume or decide to hold them, it is a problem that needs to be her. Same for CAD, COPD, HLD and so on) ? ?CAP, bacterial ?-[id number]=2 ?-  Indication for inpatient antibiotics treatment, agreed with continue ceftriaxone and cephamycins, check sputum culture, strep antigen. ?-Repeat chest x-ray in 4 weeks to document resolution of pneumonia. ? ?Acute COPD exacerbation, Gold stage II ?-Short course of p.o. prednisone x5 days starting tomorrow ?-Around-the-clock every 6 hours DuoNeb treatment, plus as needed albuterol. ?-Continue LABA and inhaled steroid. ?-VBG to rule out CO2 retention. ? ?HTN, uncontrolled ?-Continue home dose of HCTZ and lisinopril ?-As needed hydralazine. ? ?Chest pain ?-Pleural likely from PNA, Trop negativex1 and EKG no ischemic changes. Treat PNA then reevaluate. ?-Tylenol alternate with Ibuprofen for pain ? ?IIDM ?-Continue metformin 1000 twice daily ?-Add sliding scale. ? ?Cigarette smoke ?-Cessation consultation ? ?HLD, CAD ?-Statin, add ASA. ? ?DVT prophylaxis: Lovenox ?Code Status: Full code ?Family Communication: None at  bedside ?Disposition Plan: Expect less than 2 midnight hospital stay ?Consults called: None ?Admission status: Telemetry observation ? ? ?Lequita Halt MD ?[facility] ?Pager [phone] ? ?11/22/2021, 6:46 PM  ?  ?

## 2021-11-22 NOTE — ED Notes (Signed)
Pt moved to room 6 ?

## 2021-11-23 DIAGNOSIS — I2510 Atherosclerotic heart disease of native coronary artery without angina pectoris: Secondary | ICD-10-CM | POA: Diagnosis present

## 2021-11-23 DIAGNOSIS — E785 Hyperlipidemia, unspecified: Secondary | ICD-10-CM | POA: Diagnosis present

## 2021-11-23 DIAGNOSIS — Z20822 Contact with and (suspected) exposure to covid-19: Secondary | ICD-10-CM | POA: Diagnosis present

## 2021-11-23 DIAGNOSIS — J441 Chronic obstructive pulmonary disease with (acute) exacerbation: Secondary | ICD-10-CM | POA: Diagnosis present

## 2021-11-23 DIAGNOSIS — F17210 Nicotine dependence, cigarettes, uncomplicated: Secondary | ICD-10-CM | POA: Diagnosis present

## 2021-11-23 DIAGNOSIS — J440 Chronic obstructive pulmonary disease with acute lower respiratory infection: Secondary | ICD-10-CM | POA: Diagnosis present

## 2021-11-23 DIAGNOSIS — Z23 Encounter for immunization: Secondary | ICD-10-CM | POA: Diagnosis present

## 2021-11-23 DIAGNOSIS — D72828 Other elevated white blood cell count: Secondary | ICD-10-CM | POA: Diagnosis present

## 2021-11-23 DIAGNOSIS — E119 Type 2 diabetes mellitus without complications: Secondary | ICD-10-CM | POA: Diagnosis present

## 2021-11-23 DIAGNOSIS — Z7951 Long term (current) use of inhaled steroids: Secondary | ICD-10-CM | POA: Diagnosis not present

## 2021-11-23 DIAGNOSIS — J189 Pneumonia, unspecified organism: Secondary | ICD-10-CM | POA: Diagnosis present

## 2021-11-23 DIAGNOSIS — Z7984 Long term (current) use of oral hypoglycemic drugs: Secondary | ICD-10-CM | POA: Diagnosis not present

## 2021-11-23 DIAGNOSIS — I10 Essential (primary) hypertension: Secondary | ICD-10-CM | POA: Diagnosis present

## 2021-11-23 DIAGNOSIS — J9621 Acute and chronic respiratory failure with hypoxia: Secondary | ICD-10-CM | POA: Diagnosis present

## 2021-11-23 DIAGNOSIS — Z955 Presence of coronary angioplasty implant and graft: Secondary | ICD-10-CM | POA: Diagnosis not present

## 2021-11-23 DIAGNOSIS — Z79899 Other long term (current) drug therapy: Secondary | ICD-10-CM | POA: Diagnosis not present

## 2021-11-23 LAB — GLUCOSE, CAPILLARY
Glucose-Capillary: 137 mg/dL — ABNORMAL HIGH (ref 70–99)
Glucose-Capillary: 106 mg/dL — ABNORMAL HIGH (ref 70–99)
Glucose-Capillary: 117 mg/dL — ABNORMAL HIGH (ref 70–99)
Glucose-Capillary: 123 mg/dL — ABNORMAL HIGH (ref 70–99)

## 2021-11-23 LAB — CBC
HCT: 46.7 % (ref 39.0–52.0)
Hemoglobin: 14.4 g/dL (ref 13.0–17.0)
MCH: 28.7 pg (ref 26.0–34.0)
MCHC: 30.8 g/dL (ref 30.0–36.0)
MCV: 93.2 fL (ref 80.0–100.0)
Platelets: 257 10*3/uL (ref 150–400)
RBC: 5.01 MIL/uL (ref 4.22–5.81)
RDW: 12.9 % (ref 11.5–15.5)
WBC: 12.5 10*3/uL — ABNORMAL HIGH (ref 4.0–10.5)
nRBC: 0 % (ref 0.0–0.2)

## 2021-11-23 LAB — HIV ANTIBODY (ROUTINE TESTING W REFLEX): HIV Screen 4th Generation wRfx: NONREACTIVE

## 2021-11-23 MED ORDER — METHYLPREDNISOLONE SODIUM SUCC 125 MG IJ SOLR
80.0000 mg | Freq: Every day | INTRAMUSCULAR | Status: DC
  Administered 2021-11-23 – 2021-11-24 (×2): 80 mg via INTRAVENOUS
  Filled 2021-11-23 (×3): qty 2

## 2021-11-23 MED ORDER — POLYETHYLENE GLYCOL 3350 17 G PO PACK
17.0000 g | PACK | Freq: Every day | ORAL | Status: DC
  Administered 2021-11-23 – 2021-11-27 (×4): 17 g via ORAL
  Filled 2021-11-23 (×4): qty 1

## 2021-11-23 MED ORDER — ENSURE ENLIVE PO LIQD
237.0000 mL | Freq: Two times a day (BID) | ORAL | Status: DC
  Administered 2021-11-24 – 2021-11-27 (×6): 237 mL via ORAL

## 2021-11-23 MED ORDER — LOSARTAN POTASSIUM 50 MG PO TABS
100.0000 mg | ORAL_TABLET | Freq: Every evening | ORAL | Status: DC
  Administered 2021-11-23 – 2021-11-26 (×4): 100 mg via ORAL
  Filled 2021-11-23 (×4): qty 2

## 2021-11-23 MED ORDER — SENNOSIDES-DOCUSATE SODIUM 8.6-50 MG PO TABS
1.0000 | ORAL_TABLET | Freq: Two times a day (BID) | ORAL | Status: DC
  Administered 2021-11-23 – 2021-11-27 (×6): 1 via ORAL
  Filled 2021-11-23 (×7): qty 1

## 2021-11-23 MED ORDER — BUDESONIDE 0.25 MG/2ML IN SUSP
0.2500 mg | Freq: Two times a day (BID) | RESPIRATORY_TRACT | Status: DC
  Administered 2021-11-23 – 2021-11-27 (×9): 0.25 mg via RESPIRATORY_TRACT
  Filled 2021-11-23 (×9): qty 2

## 2021-11-23 MED ORDER — BISACODYL 10 MG RE SUPP: 10.0000 mg | Freq: Every day | RECTAL | Status: DC | PRN

## 2021-11-23 MED ORDER — ARFORMOTEROL TARTRATE 15 MCG/2ML IN NEBU
15.0000 ug | INHALATION_SOLUTION | Freq: Two times a day (BID) | RESPIRATORY_TRACT | Status: DC
  Administered 2021-11-23 – 2021-11-27 (×9): 15 ug via RESPIRATORY_TRACT
  Filled 2021-11-23 (×10): qty 2

## 2021-11-23 MED ORDER — ADULT MULTIVITAMIN W/MINERALS CH
1.0000 | ORAL_TABLET | Freq: Every day | ORAL | Status: DC
  Administered 2021-11-23 – 2021-11-27 (×5): 1 via ORAL
  Filled 2021-11-23 (×5): qty 1

## 2021-11-23 MED ORDER — IPRATROPIUM-ALBUTEROL 0.5-2.5 (3) MG/3ML IN SOLN
3.0000 mL | RESPIRATORY_TRACT | Status: DC
Start: 2021-11-23 — End: 2021-11-24
  Administered 2021-11-23 – 2021-11-24 (×6): 3 mL via RESPIRATORY_TRACT
  Filled 2021-11-23 (×6): qty 3

## 2021-11-23 NOTE — Plan of Care (Signed)
  Problem: Activity: Goal: Ability to tolerate increased activity will improve Outcome: Progressing   Problem: Clinical Measurements: Goal: Ability to maintain a body temperature in the normal range will improve Outcome: Progressing   Problem: Respiratory: Goal: Ability to maintain adequate ventilation will improve Outcome: Progressing Goal: Ability to maintain a clear airway will improve Outcome: Progressing   

## 2021-11-23 NOTE — Progress Notes (Signed)
PROGRESS NOTE    Louis Sullivan  IOE:[id number] DOB: [birth year] DOA: 11/22/2021 PCP: [facility]    Brief Narrative:  71 y.o. male with medical history significant of COPD Gold stage II 2020, chronic hypoxic respite failure on 2 L at bedtime, HTN, IIDM, CAD s/p stenting in 2008, cigarette smoking, presented with worsening of cough, shortness of breath and wheezing.   Symptoms started over the weekend, patient did some gardening in the morning, he thought he might contact pollen, and in the morning evening he started to have severe runny nose and postnasal drip.  Overnight he developed cough and wheezing, and cough has been productive with yellowish sputum.  And has been using nebulizer around-the-clock for last 2 days with minimal improvement.  He had episode of chills but no fever.  This morning, his wheezing and cough has become worse and he started to feel left-sided sharp-like chest pain worsening with cough and deep breath.   Assessment & Plan:   Principal Problem:   Pneumonia Active Problems:   COPD with acute exacerbation (HCC)   HTN (hypertension)   Controlled type 2 diabetes mellitus without complication, without long-term current use of insulin (HCC)  Community-acquired pneumonia Acute exacerbation of COPD Acute on chronic hypoxic respiratory failure Patient with a nighttime requirement of 2 L at home Currently requiring 4 L by nasal cannula to maintain saturations greater than 90% Still with cough and and expiratory wheeze on exam Plan: Continue Rocephin 2 g every 24 hours Continue azithromycin 500 mg p.o. daily IV steroids Solu-Medrol 80 mg daily Twice daily [place] Twice daily Pulmicort Every 4 nebs Supplemental oxygen, wean as tolerated Possible discharge in 24 hours if able to wean off oxygen  Essential hypertension Poor control over interval Continue PTA hydrochlorothiazide and lisinopril As needed IV hydralazine  Coronary artery  disease Hyperlipidemia Aspirin and statin  Chest pain Now resolved Suspect pleuritis in the setting of pulmonary infection  Non-insulin-dependent diabetes mellitus Metformin 1000 g twice daily resumed Sliding scale Carb modified diet  Tobacco use Counseled patient Offer nicotine patch    DVT prophylaxis: SQ Lovenox Code Status: Full Family Communication: None today.  Offered to call but patient declined Disposition Plan: Status is: Observation The patient will require care spanning > 2 midnights and should be moved to inpatient because: Acute hypoxic respiratory failure secondary to COPD and CAP.  On IV antibiotics and IV steroids     Level of care: Med-Surg  Consultants:  \None  Procedures:  None  Antimicrobials: Ceftriaxone Azithromycin   Subjective: Patient seen and examined.  Feels well overall but still coughing and short of breath.  Requiring 4 L oxygen.  Mild conversational dyspnea  Objective: Vitals:   11/22/21 1900 11/22/21 2123 11/23/21 0441 11/23/21 0754  BP: (!) 181/85 (!) 164/74 (!) 122/91 (!) 156/96  Pulse: 91 97 67 84  Resp: (!) 28 20 20 18   Temp:  98.6 F (37 C) 98.5 F (36.9 C) 98.8 F (37.1 C)  TempSrc:  Oral Oral   SpO2: 96% 93% 97% 96%  Weight:      Height:        Intake/Output Summary (Last 24 hours) at 11/23/2021 1037 Last data filed at 11/23/2021 0300 Gross per 24 hour  Intake 200 ml  Output 600 ml  Net -400 ml   Filed Weights   11/22/21 1655  Weight: 90.7 kg    Examination:  General exam: NAD Respiratory system: Coarse breath sounds bilaterally.  End expiratory wheeze.  Normal  work of breathing.  4 L Cardiovascular system: S1-S2, RRR, no murmurs, no pedal edema Gastrointestinal system: Soft, NT/ND, normal bowel sounds Central nervous system: Alert and oriented. No focal neurological deficits. Extremities: Symmetric 5 x 5 power. Skin: No rashes, lesions or ulcers Psychiatry: Judgement and insight appear normal. Mood  & affect appropriate.     Data Reviewed: I have personally reviewed following labs and imaging studies  CBC: Recent Labs  Lab 11/22/21 1702 11/23/21 0459  WBC 13.1* 12.5*  HGB 11.8* 14.4  HCT 39.7 46.7  MCV 97.5 93.2  PLT 196 517   Basic Metabolic Panel: Recent Labs  Lab 11/22/21 1702  NA 136  K 3.8  CL 99  CO2 26  GLUCOSE 111*  BUN 13  CREATININE 0.90  CALCIUM 8.9   GFR: Estimated Creatinine Clearance: 82 mL/min (by C-G formula based on SCr of 0.9 mg/dL). Liver Function Tests: No results for input(s): AST, ALT, ALKPHOS, BILITOT, PROT, ALBUMIN in the last 168 hours. No results for input(s): LIPASE, AMYLASE in the last 168 hours. No results for input(s): AMMONIA in the last 168 hours. Coagulation Profile: No results for input(s): INR, PROTIME in the last 168 hours. Cardiac Enzymes: No results for input(s): CKTOTAL, CKMB, CKMBINDEX, TROPONINI in the last 168 hours. BNP (last 3 results) No results for input(s): PROBNP in the last 8760 hours. HbA1C: No results for input(s): HGBA1C in the last 72 hours. CBG: Recent Labs  Lab 11/22/21 2141 11/23/21 0754  GLUCAP 218* 137*   Lipid Profile: No results for input(s): CHOL, HDL, LDLCALC, TRIG, CHOLHDL, LDLDIRECT in the last 72 hours. Thyroid Function Tests: No results for input(s): TSH, T4TOTAL, FREET4, T3FREE, THYROIDAB in the last 72 hours. Anemia Panel: No results for input(s): VITAMINB12, FOLATE, FERRITIN, TIBC, IRON, RETICCTPCT in the last 72 hours. Sepsis Labs: No results for input(s): PROCALCITON, LATICACIDVEN in the last 168 hours.  Recent Results (from the past 240 hour(s))  Resp Panel by RT-PCR (Flu A&B, Covid) Nasopharyngeal Swab     Status: None   Collection Time: 11/22/21  5:02 PM   Specimen: Nasopharyngeal Swab; Nasopharyngeal(NP) swabs in vial transport medium  Result Value Ref Range Status   SARS Coronavirus 2 by RT PCR NEGATIVE NEGATIVE Final    Comment: (NOTE) SARS-CoV-2 target nucleic acids  are NOT DETECTED.  The SARS-CoV-2 RNA is generally detectable in upper respiratory specimens during the acute phase of infection. The lowest concentration of SARS-CoV-2 viral copies this assay can detect is 138 copies/mL. A negative result does not preclude SARS-Cov-2 infection and should not be used as the sole basis for treatment or other patient management decisions. A negative result may occur with  improper specimen collection/handling, submission of specimen other than nasopharyngeal swab, presence of viral mutation(s) within the areas targeted by this assay, and inadequate number of viral copies(<138 copies/mL). A negative result must be combined with clinical observations, patient history, and epidemiological information. The expected result is Negative.  Fact Sheet for Patients:  EntrepreneurPulse.com.au  Fact Sheet for Healthcare Providers:  IncredibleEmployment.be  This test is no t yet approved or cleared by the [place] FDA and  has been authorized for detection and/or diagnosis of SARS-CoV-2 by FDA under an Emergency Use Authorization (EUA). This EUA will remain  in effect (meaning this test can be used) for the duration of the COVID-19 declaration under Section 564(b)(1) of the Act, 21 U.S.C.section 360bbb-3(b)(1), unless the authorization is terminated  or revoked sooner.       Influenza A  by PCR NEGATIVE NEGATIVE Final   Influenza B by PCR NEGATIVE NEGATIVE Final    Comment: (NOTE) The Xpert Xpress SARS-CoV-2/FLU/RSV plus assay is intended as an aid in the diagnosis of influenza from Nasopharyngeal swab specimens and should not be used as a sole basis for treatment. Nasal washings and aspirates are unacceptable for Xpert Xpress SARS-CoV-2/FLU/RSV testing.  Fact Sheet for Patients: EntrepreneurPulse.com.au  Fact Sheet for Healthcare Providers: IncredibleEmployment.be  This test is  not yet approved or cleared by the [place] FDA and has been authorized for detection and/or diagnosis of SARS-CoV-2 by FDA under an Emergency Use Authorization (EUA). This EUA will remain in effect (meaning this test can be used) for the duration of the COVID-19 declaration under Section 564(b)(1) of the Act, 21 U.S.C. section 360bbb-3(b)(1), unless the authorization is terminated or revoked.  Performed at [facility], [address] [place], [place] [address]   Culture, blood (routine x 2) Call MD if unable to obtain prior to antibiotics being given     Status: None (Preliminary result)   Collection Time: 11/22/21  7:03 PM   Specimen: BLOOD  Result Value Ref Range Status   Specimen Description BLOOD BLOOD LEFT HAND  Final   Special Requests   Final    BOTTLES DRAWN AEROBIC AND ANAEROBIC Blood Culture adequate volume   Culture   Final    NO GROWTH < 12 HOURS Performed at [facility], [address], [place], [place] [address]    Report Status PENDING  Incomplete  Culture, blood (routine x 2) Call MD if unable to obtain prior to antibiotics being given     Status: None (Preliminary result)   Collection Time: 11/22/21  7:03 PM   Specimen: BLOOD  Result Value Ref Range Status   Specimen Description BLOOD BLOOD RIGHT WRIST  Final   Special Requests   Final    BOTTLES DRAWN AEROBIC AND ANAEROBIC Blood Culture adequate volume   Culture   Final    NO GROWTH < 12 HOURS Performed at [facility], [address], [place], [place] [address]    Report Status PENDING  Incomplete         Radiology Studies: DG Chest Port 1 View  Result Date: 11/22/2021 CLINICAL DATA:  Chest pain EXAM: PORTABLE CHEST 1 VIEW COMPARISON:  04/29/2019 FINDINGS: Patchy consolidation and ground-glass opacity in the left mid to lower lung. No pleural effusion. Normal cardiac size. No pneumothorax IMPRESSION: Patchy consolidation and ground-glass opacity in the left mid to  lower lung suspicious for pneumonia. Radiographic follow-up to resolution is recommended. Electronically Signed   By: Donavan Foil M.D.   On: 11/22/2021 17:57        Scheduled Meds:  arformoterol  15 mcg Nebulization BID   aspirin EC  81 mg Oral Daily   azithromycin  500 mg Oral Daily   benzonatate  200 mg Oral TID   budesonide (PULMICORT) nebulizer solution  0.25 mg Nebulization BID   enoxaparin (LOVENOX) injection  40 mg Subcutaneous Q24H   guaiFENesin  1,200 mg Oral BID   insulin aspart  0-15 Units Subcutaneous TID WC   insulin aspart  0-5 Units Subcutaneous QHS   ipratropium-albuterol  3 mL Nebulization Q4H   lidocaine  1 patch Transdermal Q24H   losartan  50 mg Oral Daily   metFORMIN  1,000 mg Oral BID WC   methylPREDNISolone (SOLU-MEDROL) injection  80 mg Intravenous Daily   nicotine  21 mg Transdermal Daily  pneumococcal 20-valent conjugate vaccine  0.5 mL Intramuscular Tomorrow-1000   Continuous Infusions:  cefTRIAXone (ROCEPHIN)  IV       LOS: 0 days     Sidney Ace, MD [facility]   If 7PM-7AM, please contact night-coverage  11/23/2021, 10:37 AM

## 2021-11-23 NOTE — TOC Initial Note (Signed)
Transition of Care (TOC) - Initial/Assessment Note  ? ? ?Patient Details  ?Name: Louis Sullivan ?MRN: [id number] ?Date of Birth: 1951-08-04 ? ?Transition of Care (TOC) CM/SW Contact:    ?Candie Chroman, LCSW ?Phone Number: ?11/23/2021, 12:44 PM ? ?Clinical Narrative: CSW met with patient. No supports at bedside. CSW introduced role and inquired about oxygen needs. Patient currently on 4 L continuous oxygen. On 2 L at night at home through [place]. Called [facility] representative who confirmed if patient needs continuous oxygen at discharge, he will need a sats test and DME order. They will need to deliver a tank to the hospital. Will keep [place] updated. They also said if patient needs to discharge on nebulizer medications, to send those to their pharmacy due to difficulty getting [facility] to fill. Patient planning on having someone bring his car to the hospital at discharge so he can drive himself home. His car is currently at his doctor's office. No further concerns. CSW encouraged patient to contact CSW as needed. CSW will continue to follow patient for support and facilitate return home at discharge. ? ?Expected Discharge Plan: Home/Self Care ?Barriers to Discharge: Continued Medical Work up ? ? ?Patient Goals and CMS Choice ?  ?  ?  ? ?Expected Discharge Plan and Services ?Expected Discharge Plan: Home/Self Care ?  ?  ?Post Acute Care Choice: NA ?Living arrangements for the past 2 months: [place] ?                ?  ?  ?  ?  ?  ?  ?  ?  ?  ?  ? ?Prior Living Arrangements/Services ?Living arrangements for the past 2 months: [place] ?  ?Patient language and need for interpreter reviewed:: Yes ?Do you feel safe going back to the place where you live?: Yes      ?  ?  ?Current home services: DME ?Criminal Activity/Legal Involvement Pertinent to Current Situation/Hospitalization: No - Comment as needed ? ?Activities of Daily Living ?Home Assistive Devices/Equipment: None ?ADL Screening  (condition at time of admission) ?Patient's cognitive ability adequate to safely complete daily activities?: Yes ?Is the patient deaf or have difficulty hearing?: No ?Does the patient have difficulty seeing, even when wearing glasses/contacts?: No ?Does the patient have difficulty concentrating, remembering, or making decisions?: No ?Patient able to express need for assistance with ADLs?: Yes ?Does the patient have difficulty dressing or bathing?: No ?Independently performs ADLs?: Yes (appropriate for developmental age) ?Does the patient have difficulty walking or climbing stairs?: No ?Weakness of Legs: None ?Weakness of Arms/Hands: None ? ?Permission Sought/Granted ?Permission sought to share information with : Customer service manager ?Permission granted to share information with : Yes, Verbal Permission Granted ?   ? Permission granted to share info w AGENCY: [facility] ?   ?   ? ?Emotional Assessment ?Appearance:: Appears stated age ?Attitude/Demeanor/Rapport: Engaged, Gracious ?Affect (typically observed): Accepting, Appropriate, Calm, Pleasant ?Orientation: : Oriented to Self, Oriented to Place, Oriented to  Time, Oriented to Situation ?Alcohol / Substance Use: Not Applicable ?Psych Involvement: No (comment) ? ?Admission diagnosis:  Pneumonia [J18.9] ?SOB (shortness of breath) [R06.02] ?Acute respiratory failure with hypoxia ([place]) [J96.01] ?Community acquired pneumonia of left lower lobe of lung [J18.9] ?CAP (community acquired pneumonia) [J18.9] ?Patient Active Problem List  ? Diagnosis Date Noted  ? CAP (community acquired pneumonia) 11/23/2021  ? Pneumonia 11/22/2021  ? COPD with acute exacerbation ([place]) 11/22/2021  ? HTN (hypertension) 11/22/2021  ? Controlled type 2  diabetes mellitus without complication, without long-term current use of insulin ([place]) 11/22/2021  ? ?PCP:  [place] ?Pharmacy:   ?[id number] [name], [address] ?[place] ?[place] [address] ?Phone: [phone] Fax: [phone] ? ? ? ? ?Social Determinants of Health (SDOH) Interventions ?  ? ?Readmission Risk Interventions ?No flowsheet data found. ? ? ?

## 2021-11-23 NOTE — Progress Notes (Signed)
Initial Nutrition Assessment ? ?DOCUMENTATION CODES:  ? ?Obesity unspecified ? ?INTERVENTION:  ?-  Liberalize diet from heart healthy/carb mod to a regular diet to increase menu options to enhance nutritional adequacy ? ?- MVI with minerals daily ? ?- Ensure Enlive po BID, each supplement provides 350 kcal and 20 grams of protein. ? ?NUTRITION DIAGNOSIS:  ? ?Increased nutrient needs related to acute illness as evidenced by estimated needs. ? ?GOAL:  ? ?Patient will meet greater than or equal to 90% of their needs ? ?MONITOR:  ? ?PO intake, Supplement acceptance, Diet advancement, Labs, Weight trends ? ?REASON FOR ASSESSMENT:  ? ?Malnutrition Screening Tool ?  ? ?ASSESSMENT:  ? ?Pt admitted with cough, SOB and chest pain secondary to  PNA. PMH includes COPD, chronic hypoxic respiratory failure on 2L at bedtime, HTN, T2DM, CAD s/p stenting in 2008 and tobacco use. ?  ?Pt reports having a poor app x1 week d/t PNA. He typically eats 2 meals per day at home consisting of 2 eggs, grits with cheese, and toast for breakfast, and fish or chicken and creamed potatoes for lunch with 1 snack in the evening consisting of chips and a sugar free Dr. Malachi Bonds. Has been more mindful about PO intake within the last few years d/t his diabetes. He does not eat sugar sweetened foods and he uses splenda to sweeten foods and drinks water often.  ? ?Limited documentation of recent weight history. Last documented weight 107.3 kg on 03/2019. Pt states that he used to weigh 235 lbs a couple years ago and endorses gradual weight loss  d/t adjustments to his diet. ? ?Medications: azithromycin, SSI,  metformin, solu-medrol, IV rocephin ? ?Labs: CBG's 106-218 x 24 hours ? ?NUTRITION - FOCUSED PHYSICAL EXAM: ? ?Flowsheet Row Most Recent Value  ?Orbital Region No depletion  ?Upper Arm Region No depletion  ?Thoracic and Lumbar Region No depletion  ?Buccal Region Mild depletion  ?Temple Region Mild depletion  ?Clavicle Bone Region No depletion   ?Clavicle and Acromion Bone Region No depletion  ?Scapular Bone Region No depletion  ?Dorsal Hand No depletion  ?Patellar Region Mild depletion  ?Anterior Thigh Region Mild depletion  ?Posterior Calf Region No depletion  ?Edema (RD Assessment) None  ?Hair Reviewed  ?Eyes Reviewed  ?Mouth Reviewed  ?Skin Reviewed  ?Nails Reviewed  ? ?  ? ? ?Diet Order:   ?Diet Order   ? ?       ?  Diet heart healthy/carb modified Room service appropriate? Yes; Fluid consistency: Thin  Diet effective now       ?  ? ?  ?  ? ?  ? ? ?EDUCATION NEEDS:  ? ?Education needs have been addressed ? ?Skin:  Skin Assessment: Reviewed RN Assessment ? ?Last BM:  3/9 ? ?Height:  ? ?Ht Readings from Last 1 Encounters:  ?11/22/21 5\' 7"  (1.702 m)  ? ? ?Weight:  ? ?Wt Readings from Last 1 Encounters:  ?11/22/21 90.7 kg  ? ? ?Ideal Body Weight:  67.3 kg ? ?BMI:  Body mass index is 31.32 kg/m?. ? ?Estimated Nutritional Needs:  ? ?Kcal:  2000-2200 ? ?Protein:  100-115g ? ?Fluid:  >/=2.0L ? ?Clayborne Dana, RDN, LDN ?Clinical Nutrition ?

## 2021-11-24 DIAGNOSIS — J189 Pneumonia, unspecified organism: Secondary | ICD-10-CM | POA: Diagnosis not present

## 2021-11-24 DIAGNOSIS — J441 Chronic obstructive pulmonary disease with (acute) exacerbation: Secondary | ICD-10-CM | POA: Diagnosis not present

## 2021-11-24 LAB — GLUCOSE, CAPILLARY
Glucose-Capillary: 111 mg/dL — ABNORMAL HIGH (ref 70–99)
Glucose-Capillary: 179 mg/dL — ABNORMAL HIGH (ref 70–99)
Glucose-Capillary: 120 mg/dL — ABNORMAL HIGH (ref 70–99)
Glucose-Capillary: 129 mg/dL — ABNORMAL HIGH (ref 70–99)

## 2021-11-24 LAB — HEMOGLOBIN A1C
Hgb A1c MFr Bld: 5.7 % — ABNORMAL HIGH (ref 4.8–5.6)
Mean Plasma Glucose: 117 mg/dL

## 2021-11-24 MED ORDER — IPRATROPIUM-ALBUTEROL 0.5-2.5 (3) MG/3ML IN SOLN
3.0000 mL | Freq: Four times a day (QID) | RESPIRATORY_TRACT | Status: DC
  Administered 2021-11-24 – 2021-11-27 (×9): 3 mL via RESPIRATORY_TRACT
  Filled 2021-11-24 (×10): qty 3

## 2021-11-24 MED ORDER — ALBUTEROL SULFATE (2.5 MG/3ML) 0.083% IN NEBU
2.5000 mg | INHALATION_SOLUTION | RESPIRATORY_TRACT | Status: DC | PRN
  Administered 2021-11-26 (×3): 2.5 mg via RESPIRATORY_TRACT
  Filled 2021-11-24 (×3): qty 3

## 2021-11-24 NOTE — Evaluation (Signed)
Physical Therapy Evaluation ?Patient Details ?Name: Louis Sullivan ?MRN: [id number] ?DOB: Aug 18, 1951 ?Today's Date: 11/24/2021 ? ?History of Present Illness ? 71 yo male admitted on 3/9 was found to have SOB and productive cough, using increased supplemental O2 and has required extra breathing treatments wiht nebulizer.  Noted chest pain and wheezing.  PMHx:  COPD, HTN, DM, CAD with stents, smoker  ?Clinical Impression ? Pt was seen for mobility on no AD, did use supplemental O2 which is typically only at night.  Pt will follow PT for mobility and monitor his needs for O2 since he was only on PM use.  Titrated 3L to 4L for gait and was able to maintain on 3L at rest.  Baseline O2 90% on 3L, up to 97% after walk and then back down to 3L.  Pt is motivated to get home ASAP.  Follow for acute PT goals as outlined below. ?   ? ?Recommendations for follow up therapy are one component of a multi-disciplinary discharge planning process, led by the attending physician.  Recommendations may be updated based on patient status, additional functional criteria and insurance authorization. ? ?Follow Up Recommendations No PT follow up ? ?  ?Assistance Recommended at Discharge Intermittent Supervision/Assistance  ?Patient can return home with the following ? Assistance with cooking/housework ? ?  ?Equipment Recommendations None recommended by PT  ?Recommendations for Other Services ?    ?  ?Functional Status Assessment Patient has had a recent decline in their functional status and demonstrates the ability to make significant improvements in function in a reasonable and predictable amount of time.  ? ?  ?Precautions / Restrictions Precautions ?Precautions: Other (comment) (monitor O2 sats) ?Restrictions ?Weight Bearing Restrictions: No  ? ?  ? ?Mobility ? Bed Mobility ?Overal bed mobility: Modified Independent ?  ?  ?  ?  ?  ?  ?  ?  ? ?Transfers ?Overall transfer level: Modified independent ?Equipment used: None ?  ?  ?  ?  ?  ?  ?   ?  ?  ? ?Ambulation/Gait ?Ambulation/Gait assistance: Min guard (only for safety) ?Gait Distance (Feet): 300 Feet ?Assistive device: None ?Gait Pattern/deviations: Step-through pattern, Narrow base of support ?Gait velocity: controlled ?Gait velocity interpretation: <1.31 ft/sec, indicative of household ambulator ?Pre-gait activities: standing balance ck ?General Gait Details: pt took his time but used 4L due to baseline on 3L at 90% sat ? ?Stairs ?  ?  ?  ?  ?  ? ?Wheelchair Mobility ?  ? ?Modified Rankin (Stroke Patients Only) ?  ? ?  ? ?Balance Overall balance assessment: No apparent balance deficits (not formally assessed) ?  ?  ?  ?  ?  ?  ?  ?  ?  ?  ?  ?  ?  ?  ?  ?  ?  ?  ?   ? ? ? ?Pertinent Vitals/Pain Pain Assessment ?Pain Assessment: No/denies pain  ? ? ?Home Living Family/patient expects to be discharged to:: Private residence ?Living Arrangements: Alone ?Available Help at Discharge: Family;Friend(s);Available PRN/intermittently ?Type of Home: House ?Home Access: Stairs to enter ?Entrance Stairs-Rails: Right;Left ?Entrance Stairs-Number of Steps: 3 ?  ?Home Layout: One level ?Home Equipment: Kasandra Knudsen - single point;Shower seat ?Additional Comments: has been doing lawn work and active, driving and independent  ?  ?Prior Function Prior Level of Function : Independent/Modified Independent;Working/employed;Driving ?  ?  ?  ?  ?  ?  ?Mobility Comments: no falls ?  ?  ? ? ?  Hand Dominance  ? Dominant Hand: Right ? ?  ?Extremity/Trunk Assessment  ? Upper Extremity Assessment ?Upper Extremity Assessment: Overall WFL for tasks assessed ?  ? ?Lower Extremity Assessment ?Lower Extremity Assessment: Overall WFL for tasks assessed ?  ? ?Cervical / Trunk Assessment ?Cervical / Trunk Assessment: Normal  ?Communication  ? Communication: No difficulties  ?Cognition Arousal/Alertness: Awake/alert ?Behavior During Therapy: [facility] for tasks assessed/performed ?Overall Cognitive Status: Within Functional Limits for tasks  assessed ?  ?  ?  ?  ?  ?  ?  ?  ?  ?  ?  ?  ?  ?  ?  ?  ?  ?  ?  ? ?  ?General Comments General comments (skin integrity, edema, etc.): pt is up to side of bed and noted at 3L O2 was 90% sat, titrated to 4L for gait.  Pt was 97% at end of walk so returned to 3L at rest ? ?  ?Exercises    ? ?Assessment/Plan  ?  ?PT Assessment Patient needs continued PT services  ?PT Problem List Cardiopulmonary status limiting activity;Decreased activity tolerance ? ?   ?  ?PT Treatment Interventions DME instruction;Gait training;Stair training;Functional mobility training;Therapeutic activities;Therapeutic exercise;Balance training;Neuromuscular re-education;Patient/family education   ? ?PT Goals (Current goals can be found in the Care Plan section)  ?Acute Rehab PT Goals ?Patient Stated Goal: to get back to work and grandchildren ?PT Goal Formulation: With patient ?Time For Goal Achievement: 12/01/21 ?Potential to Achieve Goals: Good ? ?  ?Frequency Min 2X/week ?  ? ? ?Co-evaluation   ?  ?  ?  ?  ? ? ?  ?AM-PAC PT "6 Clicks" Mobility  ?Outcome Measure Help needed turning from your back to your side while in a flat bed without using bedrails?: None ?Help needed moving from lying on your back to sitting on the side of a flat bed without using bedrails?: None ?Help needed moving to and from a bed to a chair (including a wheelchair)?: None ?Help needed standing up from a chair using your arms (e.g., wheelchair or bedside chair)?: A Little ?Help needed to walk in hospital room?: A Little ?Help needed climbing 3-5 steps with a railing? : A Little ?6 Click Score: 21 ? ?  ?End of Session Equipment Utilized During Treatment: Gait belt;Oxygen ?Activity Tolerance: Treatment limited secondary to medical complications (Comment) ?Patient left: in bed;with call bell/phone within reach ?Nurse Communication: Mobility status ?PT Visit Diagnosis: Other abnormalities of gait and mobility (R26.89) ?  ? ?Time: 1010-1037 ?PT Time Calculation (min)  (ACUTE ONLY): 27 min ? ? ?Charges:   PT Evaluation ?$PT Eval Moderate Complexity: 1 Mod ?PT Treatments ?$Gait Training: 8-22 mins ?  ?   ? ?Ramond Dial ?11/24/2021, 11:42 AM ? ?Mee Hives, PT PhD ?Acute Rehab Dept. Number: [facility] [phone] and [place] [phone] ? ? ?

## 2021-11-24 NOTE — Progress Notes (Signed)
PROGRESS NOTE    Louis Sullivan  JEH:[id number] DOB: [birth year] DOA: 11/22/2021 PCP: [facility]    Brief Narrative:  71 y.o. male with medical history significant of COPD Gold stage II 2020, chronic hypoxic respite failure on 2 L at bedtime, HTN, IIDM, CAD s/p stenting in 2008, cigarette smoking, presented with worsening of cough, shortness of breath and wheezing.   Symptoms started over the weekend, patient did some gardening in the morning, he thought he might contact pollen, and in the morning evening he started to have severe runny nose and postnasal drip.  Overnight he developed cough and wheezing, and cough has been productive with yellowish sputum.  And has been using nebulizer around-the-clock for last 2 days with minimal improvement.  He had episode of chills but no fever.  This morning, his wheezing and cough has become worse and he started to feel left-sided sharp-like chest pain worsening with cough and deep breath.   Assessment & Plan:   Principal Problem:   Pneumonia Active Problems:   COPD with acute exacerbation (HCC)   HTN (hypertension)   Controlled type 2 diabetes mellitus without complication, without long-term current use of insulin (HCC)   CAP (community acquired pneumonia)  Community-acquired pneumonia Acute exacerbation of COPD Acute on chronic hypoxic respiratory failure Patient with a nighttime requirement of 2 L at home Currently requiring 4 L by nasal cannula to maintain saturations greater than 90% Still with cough and and expiratory wheeze on exam Plan: Continue Rocephin 2 g every 24 hours Continue azithromycin 500 mg p.o. daily Continue IV steroids Solu-Medrol 80 mg daily Twice daily Brovana Twice daily Pulmicort Every 4 DuoNebs Supplemental oxygen, wean as tolerated If able to wean to 2 L or less restless can ambulate tomorrow to evaluate home oxygen needs in preparation for discharge  Essential hypertension Improved control over  interval Continue PTA hydrochlorothiazide and lisinopril As needed IV hydralazine  Coronary artery disease Hyperlipidemia Aspirin and statin  Chest pain Now resolved Suspect pleuritis in the setting of pulmonary infection  Non-insulin-dependent diabetes mellitus Metformin 1000 g twice daily resumed Sliding scale Carb modified diet  Tobacco use Counseled patient Offer nicotine patch    DVT prophylaxis: SQ Lovenox Code Status: Full Family Communication: None today.  Offered to call but patient declined Disposition Plan: Status is: Inpatient Remains inpatient appropriate because: Acute on chronic hypoxic respiratory failure secondary to decompensated COPD and community-acquired pneumonia.  Possible discharge in 24 hours       Level of care: Med-Surg  Consultants:  \None  Procedures:  None  Antimicrobials: Ceftriaxone Azithromycin   Subjective: Patient seen and examined.  Feels well overall but still coughing and short of breath.  Requiring 4 L oxygen.  Conversational dyspnea improved  Objective: Vitals:   11/24/21 0422 11/24/21 0553 11/24/21 0735 11/24/21 0841  BP:  (!) 151/73 (!) 124/55   Pulse:  (!) 58 81 82  Resp:  20 18 16   Temp:  97.7 F (36.5 C) 97.8 F (36.6 C)   TempSrc:  Oral Oral   SpO2: 96% 97% 96% 93%  Weight:      Height:        Intake/Output Summary (Last 24 hours) at 11/24/2021 1141 Last data filed at 11/24/2021 1015 Gross per 24 hour  Intake 460 ml  Output 1100 ml  Net -640 ml   Filed Weights   11/22/21 1655  Weight: 90.7 kg    Examination:  General exam: No acute distress Respiratory system: Coarse breath  sounds bilaterally.  Normal work of breathing.  4 L Cardiovascular system: S1-S2, RRR, no murmurs, no pedal edema Gastrointestinal system: Soft, NT/ND, normal bowel sounds Central nervous system: Alert and oriented. No focal neurological deficits. Extremities: Symmetric 5 x 5 power. Skin: No rashes, lesions or  ulcers Psychiatry: Judgement and insight appear normal. Mood & affect appropriate.     Data Reviewed: I have personally reviewed following labs and imaging studies  CBC: Recent Labs  Lab 11/22/21 1702 11/23/21 0459  WBC 13.1* 12.5*  HGB 11.8* 14.4  HCT 39.7 46.7  MCV 97.5 93.2  PLT 196 109   Basic Metabolic Panel: Recent Labs  Lab 11/22/21 1702  NA 136  K 3.8  CL 99  CO2 26  GLUCOSE 111*  BUN 13  CREATININE 0.90  CALCIUM 8.9   GFR: Estimated Creatinine Clearance: 82 mL/min (by C-G formula based on SCr of 0.9 mg/dL). Liver Function Tests: No results for input(s): AST, ALT, ALKPHOS, BILITOT, PROT, ALBUMIN in the last 168 hours. No results for input(s): LIPASE, AMYLASE in the last 168 hours. No results for input(s): AMMONIA in the last 168 hours. Coagulation Profile: No results for input(s): INR, PROTIME in the last 168 hours. Cardiac Enzymes: No results for input(s): CKTOTAL, CKMB, CKMBINDEX, TROPONINI in the last 168 hours. BNP (last 3 results) No results for input(s): PROBNP in the last 8760 hours. HbA1C: Recent Labs    11/22/21 1702  HGBA1C 5.7*   CBG: Recent Labs  Lab 11/23/21 0754 11/23/21 1147 11/23/21 1642 11/23/21 2100 11/24/21 0737  GLUCAP 137* 106* 117* 123* 111*   Lipid Profile: No results for input(s): CHOL, HDL, LDLCALC, TRIG, CHOLHDL, LDLDIRECT in the last 72 hours. Thyroid Function Tests: No results for input(s): TSH, T4TOTAL, FREET4, T3FREE, THYROIDAB in the last 72 hours. Anemia Panel: No results for input(s): VITAMINB12, FOLATE, FERRITIN, TIBC, IRON, RETICCTPCT in the last 72 hours. Sepsis Labs: No results for input(s): PROCALCITON, LATICACIDVEN in the last 168 hours.  Recent Results (from the past 240 hour(s))  Resp Panel by RT-PCR (Flu A&B, Covid) Nasopharyngeal Swab     Status: None   Collection Time: 11/22/21  5:02 PM   Specimen: Nasopharyngeal Swab; Nasopharyngeal(NP) swabs in vial transport medium  Result Value Ref Range  Status   SARS Coronavirus 2 by RT PCR NEGATIVE NEGATIVE Final    Comment: (NOTE) SARS-CoV-2 target nucleic acids are NOT DETECTED.  The SARS-CoV-2 RNA is generally detectable in upper respiratory specimens during the acute phase of infection. The lowest concentration of SARS-CoV-2 viral copies this assay can detect is 138 copies/mL. A negative result does not preclude SARS-Cov-2 infection and should not be used as the sole basis for treatment or other patient management decisions. A negative result may occur with  improper specimen collection/handling, submission of specimen other than nasopharyngeal swab, presence of viral mutation(s) within the areas targeted by this assay, and inadequate number of viral copies(<138 copies/mL). A negative result must be combined with clinical observations, patient history, and epidemiological information. The expected result is Negative.  Fact Sheet for Patients:  EntrepreneurPulse.com.au  Fact Sheet for Healthcare Providers:  IncredibleEmployment.be  This test is no t yet approved or cleared by the [place] FDA and  has been authorized for detection and/or diagnosis of SARS-CoV-2 by FDA under an Emergency Use Authorization (EUA). This EUA will remain  in effect (meaning this test can be used) for the duration of the COVID-19 declaration under Section 564(b)(1) of the Act, 21 U.S.C.section 360bbb-3(b)(1), unless the  authorization is terminated  or revoked sooner.       Influenza A by PCR NEGATIVE NEGATIVE Final   Influenza B by PCR NEGATIVE NEGATIVE Final    Comment: (NOTE) The Xpert Xpress SARS-CoV-2/FLU/RSV plus assay is intended as an aid in the diagnosis of influenza from Nasopharyngeal swab specimens and should not be used as a sole basis for treatment. Nasal washings and aspirates are unacceptable for Xpert Xpress SARS-CoV-2/FLU/RSV testing.  Fact Sheet for  Patients: EntrepreneurPulse.com.au  Fact Sheet for Healthcare Providers: IncredibleEmployment.be  This test is not yet approved or cleared by the [place] FDA and has been authorized for detection and/or diagnosis of SARS-CoV-2 by FDA under an Emergency Use Authorization (EUA). This EUA will remain in effect (meaning this test can be used) for the duration of the COVID-19 declaration under Section 564(b)(1) of the Act, 21 U.S.C. section 360bbb-3(b)(1), unless the authorization is terminated or revoked.  Performed at [facility], [address] [place], [place] [address]   Culture, blood (routine x 2) Call MD if unable to obtain prior to antibiotics being given     Status: None (Preliminary result)   Collection Time: 11/22/21  7:03 PM   Specimen: BLOOD  Result Value Ref Range Status   Specimen Description BLOOD BLOOD LEFT HAND  Final   Special Requests   Final    BOTTLES DRAWN AEROBIC AND ANAEROBIC Blood Culture adequate volume   Culture   Final    NO GROWTH 2 DAYS Performed at [facility], [address], [place], [place] [address]    Report Status PENDING  Incomplete  Culture, blood (routine x 2) Call MD if unable to obtain prior to antibiotics being given     Status: None (Preliminary result)   Collection Time: 11/22/21  7:03 PM   Specimen: BLOOD  Result Value Ref Range Status   Specimen Description BLOOD BLOOD RIGHT WRIST  Final   Special Requests   Final    BOTTLES DRAWN AEROBIC AND ANAEROBIC Blood Culture adequate volume   Culture   Final    NO GROWTH 2 DAYS Performed at [facility], [address], [place], [place] [address]    Report Status PENDING  Incomplete         Radiology Studies: DG Chest Port 1 View  Result Date: 11/22/2021 CLINICAL DATA:  Chest pain EXAM: PORTABLE CHEST 1 VIEW COMPARISON:  04/29/2019 FINDINGS: Patchy consolidation and ground-glass opacity in the left mid to lower  lung. No pleural effusion. Normal cardiac size. No pneumothorax IMPRESSION: Patchy consolidation and ground-glass opacity in the left mid to lower lung suspicious for pneumonia. Radiographic follow-up to resolution is recommended. Electronically Signed   By: Donavan Foil M.D.   On: 11/22/2021 17:57        Scheduled Meds:  arformoterol  15 mcg Nebulization BID   aspirin EC  81 mg Oral Daily   azithromycin  500 mg Oral Daily   benzonatate  200 mg Oral TID   budesonide (PULMICORT) nebulizer solution  0.25 mg Nebulization BID   enoxaparin (LOVENOX) injection  40 mg Subcutaneous Q24H   feeding supplement  237 mL Oral BID BM   guaiFENesin  1,200 mg Oral BID   insulin aspart  0-15 Units Subcutaneous TID WC   insulin aspart  0-5 Units Subcutaneous QHS   ipratropium-albuterol  3 mL Nebulization Q6H   lidocaine  1 patch Transdermal Q24H   losartan  100 mg Oral QPM   losartan  50  mg Oral Daily   metFORMIN  1,000 mg Oral BID WC   methylPREDNISolone (SOLU-MEDROL) injection  80 mg Intravenous Daily   multivitamin with minerals  1 tablet Oral Daily   nicotine  21 mg Transdermal Daily   polyethylene glycol  17 g Oral Daily   senna-docusate  1 tablet Oral BID   Continuous Infusions:  cefTRIAXone (ROCEPHIN)  IV 2 g (11/24/21 0926)     LOS: 1 day     Sidney Ace, MD [facility]   If 7PM-7AM, please contact night-coverage  11/24/2021, 11:41 AM

## 2021-11-24 NOTE — Evaluation (Signed)
Occupational Therapy Evaluation ?Patient Details ?Name: Louis Sullivan ?MRN: [id number] ?DOB: 04-Aug-1951 ?Today's Date: 11/24/2021 ? ? ?History of Present Illness 71 yo male admitted on 3/9 was found to have SOB and productive cough, using increased supplemental O2 and has required extra breathing treatments wiht nebulizer.  Noted chest pain and wheezing.  PMHx:  COPD, HTN, DM, CAD with stents, smoker  ? ?Clinical Impression ?  ?Louis Sullivan was seen for OT evaluation this date. Prior to hospital admission, pt was independent in all aspects of ADL/IADL management. He endorses staying active by regularly going to the gym, doing yard work, and helping around his church. He denies falls history in past 12 months. Pt lives with alone in a 1 story home with 3 steps to enter with bilateral hand rails. Currently demonstrates baseline independence to perform ADL and mobility tasks and no strength, sensory, coordination, cognitive, or visual deficits appreciated with assessment. Pt educated on falls prevention strategies for home and hospital and energy conservation techniques to maximize safety during functional tasks. No further skilled OT needs identified. Will sign off. Please re-consult if additional OT needs arise during this hospital stay.  ?  ?   ? ?Recommendations for follow up therapy are one component of a multi-disciplinary discharge planning process, led by the attending physician.  Recommendations may be updated based on patient status, additional functional criteria and insurance authorization.  ? ?Follow Up Recommendations ? No OT follow up  ?  ?Assistance Recommended at Discharge PRN  ?Patient can return home with the following   ? ?  ?Functional Status Assessment ? Patient has not had a recent decline in their functional status  ?Equipment Recommendations ? None recommended by OT  ?  ?Recommendations for Other Services   ? ? ?  ?Precautions / Restrictions Precautions ?Precautions: Fall ?Precaution Comments:  Monitor O2 ?Restrictions ?Weight Bearing Restrictions: No  ? ?  ? ?Mobility Bed Mobility ?Overal bed mobility: Modified Independent ?  ?  ?  ?  ?  ?  ?  ?  ? ?Transfers ?Overall transfer level: Modified independent ?Equipment used: None ?  ?  ?  ?  ?  ?  ?  ?  ?  ? ?  ?Balance Overall balance assessment: No apparent balance deficits (not formally assessed) ?  ?  ?  ?  ?  ?  ?  ?  ?  ?  ?  ?  ?  ?  ?  ?  ?  ?  ?   ? ?ADL either performed or assessed with clinical judgement  ? ?ADL Overall ADL's : At baseline ?  ?  ?  ?  ?  ?  ?  ?  ?  ?  ?  ?  ?  ?  ?  ?  ?  ?  ?  ?General ADL Comments: Pt presents at or near baseline level of functional independence. Denies any strength, sensory, of visusal deficits that would limit his ability to participate in daily routines upon DC. Up ad lib in room.  ? ? ? ?Vision Patient Visual Report: No change from baseline ?   ?   ?Perception   ?  ?Praxis   ?  ? ?Pertinent Vitals/Pain Pain Assessment ?Pain Assessment: No/denies pain  ? ? ? ?Hand Dominance Right ?  ?Extremity/Trunk Assessment Upper Extremity Assessment ?Upper Extremity Assessment: Overall WFL for tasks assessed ?  ?Lower Extremity Assessment ?Lower Extremity Assessment: Overall WFL for tasks assessed ?  ?Cervical /  Trunk Assessment ?Cervical / Trunk Assessment: Normal ?  ?Communication Communication ?Communication: No difficulties ?  ?Cognition Arousal/Alertness: Awake/alert ?Behavior During Therapy: [facility] for tasks assessed/performed ?Overall Cognitive Status: Within Functional Limits for tasks assessed ?  ?  ?  ?  ?  ?  ?  ?  ?  ?  ?  ?  ?  ?  ?  ?  ?General Comments: Pleasant, conversational. ?  ?  ?General Comments  O2 sat's monitored and remain [facility] t/o session. Pt on 3L [place] t/o session. No desat with functional trasfer from bed to recliner. ? ?  ?Exercises Other Exercises ?Other Exercises: Pt educated on role of OT in acute setting, falls prevention. ?  ?Shoulder Instructions    ? ? ?Home Living Family/patient expects to  be discharged to:: Private residence ?Living Arrangements: Alone ?Available Help at Discharge: Family;Friend(s);Available PRN/intermittently ?Type of Home: House ?Home Access: Stairs to enter ?Entrance Stairs-Number of Steps: 3 ?Entrance Stairs-Rails: Right;Left ?Home Layout: One level ?  ?  ?Bathroom Shower/Tub: Tub/shower unit ?  ?Bathroom Toilet: Handicapped height ?  ?  ?Home Equipment: Kasandra Knudsen - single point;Shower seat ?  ?Additional Comments: has been doing lawn work and active, driving and independent ?  ? ?  ?Prior Functioning/Environment Prior Level of Function : Independent/Modified Independent;Working/employed;Driving ?  ?  ?  ?  ?  ?  ?Mobility Comments: no falls ?ADLs Comments: Independent for all ADL/IADL management. Works in his yard and for his church regularly. No falls. ?  ? ?  ?  ?OT Problem List: Cardiopulmonary status limiting activity;Decreased activity tolerance;Decreased safety awareness ?  ?   ?OT Treatment/Interventions:    ?  ?OT Goals(Current goals can be found in the care plan section) Acute Rehab OT Goals ?Patient Stated Goal: To go home and get back to doing yardwork. ?OT Goal Formulation: All assessment and education complete, DC therapy ?Time For Goal Achievement: 11/24/21  ?OT Frequency:   ?  ? ?Co-evaluation   ?  ?  ?  ?  ? ?  ?AM-PAC OT "6 Clicks" Daily Activity     ?Outcome Measure Help from another person eating meals?: None ?Help from another person taking care of personal grooming?: None ?Help from another person toileting, which includes using toliet, bedpan, or urinal?: None ?Help from another person bathing (including washing, rinsing, drying)?: None ?Help from another person to put on and taking off regular upper body clothing?: None ?Help from another person to put on and taking off regular lower body clothing?: None ?6 Click Score: 24 ?  ?End of Session Equipment Utilized During Treatment: Gait belt ? ?Activity Tolerance: Patient tolerated treatment well ?Patient left: in  chair;with call bell/phone within reach ? ?OT Visit Diagnosis: Other abnormalities of gait and mobility (R26.89)  ?              ?Time: 0102-7253 ?OT Time Calculation (min): 20 min ?Charges:  OT General Charges ?$OT Visit: 1 Visit ?OT Evaluation ?$OT Eval Low Complexity: 1 Low ?OT Treatments ?$Self Care/Home Management : 8-22 mins ? ?Shara Blazing, M.S., OTR/L ?Feeding Team - [facility] ?Ascom: [phone] ?11/24/21, 12:47 PM ? ?

## 2021-11-25 DIAGNOSIS — J441 Chronic obstructive pulmonary disease with (acute) exacerbation: Secondary | ICD-10-CM | POA: Diagnosis not present

## 2021-11-25 DIAGNOSIS — J189 Pneumonia, unspecified organism: Secondary | ICD-10-CM | POA: Diagnosis not present

## 2021-11-25 LAB — GLUCOSE, CAPILLARY
Glucose-Capillary: 96 mg/dL (ref 70–99)
Glucose-Capillary: 116 mg/dL — ABNORMAL HIGH (ref 70–99)
Glucose-Capillary: 106 mg/dL — ABNORMAL HIGH (ref 70–99)
Glucose-Capillary: 101 mg/dL — ABNORMAL HIGH (ref 70–99)

## 2021-11-25 MED ORDER — METHYLPREDNISOLONE SODIUM SUCC 125 MG IJ SOLR
60.0000 mg | Freq: Every day | INTRAMUSCULAR | Status: DC
  Administered 2021-11-25 – 2021-11-26 (×2): 60 mg via INTRAVENOUS
  Filled 2021-11-25: qty 2

## 2021-11-25 NOTE — Progress Notes (Signed)
PROGRESS NOTE    Louis Sullivan  HER:[id number] DOB: Jan 21, 1951 DOA: 11/22/2021 PCP: [facility]    Brief Narrative:  71 y.o. male with medical history significant of COPD Gold stage II 2020, chronic hypoxic respite failure on 2 L at bedtime, HTN, IIDM, CAD s/p stenting in 2008, cigarette smoking, presented with worsening of cough, shortness of breath and wheezing.   Symptoms started over the weekend, patient did some gardening in the morning, he thought he might contact pollen, and in the morning evening he started to have severe runny nose and postnasal drip.  Overnight he developed cough and wheezing, and cough has been productive with yellowish sputum.  And has been using nebulizer around-the-clock for last 2 days with minimal improvement.  He had episode of chills but no fever.  This morning, his wheezing and cough has become worse and he started to feel left-sided sharp-like chest pain worsening with cough and deep breath.  Patient has been improving albeit slowly.  He remains on 3 L nasal cannula at rest as of 3/12  Assessment & Plan:   Principal Problem:   Pneumonia Active Problems:   COPD with acute exacerbation (HCC)   HTN (hypertension)   Controlled type 2 diabetes mellitus without complication, without long-term current use of insulin ([place])   CAP (community acquired pneumonia)  Community-acquired pneumonia Acute exacerbation of COPD Acute on chronic hypoxic respiratory failure Patient with a nighttime requirement of 2 L at home Currently requiring 3 L by nasal cannula to maintain saturations greater than 90% Still with cough and and expiratory wheeze on exam Plan: Continue Rocephin 2 g every 24 hours Continue azithromycin 500 mg p.o. daily Continue IV steroids, wean dose to 60 mg daily Twice daily Brovana Twice daily Pulmicort Every 4 DuoNebs Supplemental oxygen, wean as tolerated Attempt to wean to 2 L or less at rest Plan ambulate in preparation for  discharge on 3/13  Essential hypertension Adequate control over interval Continue PTA hydrochlorothiazide and lisinopril As needed IV hydralazine  Coronary artery disease Hyperlipidemia Aspirin and statin  Chest pain Now resolved Suspect pleuritis in the setting of pulmonary infection  Non-insulin-dependent diabetes mellitus Metformin 1000 g twice daily resumed Sliding scale Carb modified diet  Tobacco use Counseled patient Offer nicotine patch  DVT prophylaxis: SQ Lovenox Code Status: Full Family Communication: None today.  Offered to call but patient declined Disposition Plan: Status is: Inpatient Remains inpatient appropriate because: Acute on chronic hypoxic respiratory failure secondary to decompensated COPD and community-acquired pneumonia.  Remains on 3 L nasal cannula   Level of care: Med-Surg  Consultants:  None  Procedures:  None  Antimicrobials: Ceftriaxone Azithromycin   Subjective: Patient seen and examined.  Slowly improving.  On 3 L at rest  Objective: Vitals:   11/25/21 0400 11/25/21 0429 11/25/21 0715 11/25/21 0803  BP:    (!) 149/67  Pulse:   87 96  Resp:   16 20  Temp:    98.5 F (36.9 C)  TempSrc:      SpO2: (!) 89% 94% 93% 94%  Weight:      Height:        Intake/Output Summary (Last 24 hours) at 11/25/2021 1037 Last data filed at 11/25/2021 1008 Gross per 24 hour  Intake 420 ml  Output --  Net 420 ml   Filed Weights   11/22/21 1655  Weight: 90.7 kg    Examination:  General exam: NAD Respiratory system: Coarse breath sounds bilaterally.  Normal work of  breathing.  3 L Cardiovascular system: S1-S2, RRR, no murmurs, no pedal edema Gastrointestinal system: Soft, NT/ND, normal bowel sounds Central nervous system: Alert and oriented. No focal neurological deficits. Extremities: Symmetric 5 x 5 power. Skin: No rashes, lesions or ulcers Psychiatry: Judgement and insight appear normal. Mood & affect appropriate.     Data  Reviewed: I have personally reviewed following labs and imaging studies  CBC: Recent Labs  Lab 11/22/21 1702 11/23/21 0459  WBC 13.1* 12.5*  HGB 11.8* 14.4  HCT 39.7 46.7  MCV 97.5 93.2  PLT 196 546   Basic Metabolic Panel: Recent Labs  Lab 11/22/21 1702  NA 136  K 3.8  CL 99  CO2 26  GLUCOSE 111*  BUN 13  CREATININE 0.90  CALCIUM 8.9   GFR: Estimated Creatinine Clearance: 82 mL/min (by C-G formula based on SCr of 0.9 mg/dL). Liver Function Tests: No results for input(s): AST, ALT, ALKPHOS, BILITOT, PROT, ALBUMIN in the last 168 hours. No results for input(s): LIPASE, AMYLASE in the last 168 hours. No results for input(s): AMMONIA in the last 168 hours. Coagulation Profile: No results for input(s): INR, PROTIME in the last 168 hours. Cardiac Enzymes: No results for input(s): CKTOTAL, CKMB, CKMBINDEX, TROPONINI in the last 168 hours. BNP (last 3 results) No results for input(s): PROBNP in the last 8760 hours. HbA1C: Recent Labs    11/22/21 1702  HGBA1C 5.7*   CBG: Recent Labs  Lab 11/24/21 0737 11/24/21 1342 11/24/21 1623 11/24/21 2108 11/25/21 0803  GLUCAP 111* 179* 120* 129* 96   Lipid Profile: No results for input(s): CHOL, HDL, LDLCALC, TRIG, CHOLHDL, LDLDIRECT in the last 72 hours. Thyroid Function Tests: No results for input(s): TSH, T4TOTAL, FREET4, T3FREE, THYROIDAB in the last 72 hours. Anemia Panel: No results for input(s): VITAMINB12, FOLATE, FERRITIN, TIBC, IRON, RETICCTPCT in the last 72 hours. Sepsis Labs: No results for input(s): PROCALCITON, LATICACIDVEN in the last 168 hours.  Recent Results (from the past 240 hour(s))  Resp Panel by RT-PCR (Flu A&B, Covid) Nasopharyngeal Swab     Status: None   Collection Time: 11/22/21  5:02 PM   Specimen: Nasopharyngeal Swab; Nasopharyngeal(NP) swabs in vial transport medium  Result Value Ref Range Status   SARS Coronavirus 2 by RT PCR NEGATIVE NEGATIVE Final    Comment: (NOTE) SARS-CoV-2 target  nucleic acids are NOT DETECTED.  The SARS-CoV-2 RNA is generally detectable in upper respiratory specimens during the acute phase of infection. The lowest concentration of SARS-CoV-2 viral copies this assay can detect is 138 copies/mL. A negative result does not preclude SARS-Cov-2 infection and should not be used as the sole basis for treatment or other patient management decisions. A negative result may occur with  improper specimen collection/handling, submission of specimen other than nasopharyngeal swab, presence of viral mutation(s) within the areas targeted by this assay, and inadequate number of viral copies(<138 copies/mL). A negative result must be combined with clinical observations, patient history, and epidemiological information. The expected result is Negative.  Fact Sheet for Patients:  EntrepreneurPulse.com.au  Fact Sheet for Healthcare Providers:  IncredibleEmployment.be  This test is no t yet approved or cleared by the [place] FDA and  has been authorized for detection and/or diagnosis of SARS-CoV-2 by FDA under an Emergency Use Authorization (EUA). This EUA will remain  in effect (meaning this test can be used) for the duration of the COVID-19 declaration under Section 564(b)(1) of the Act, 21 U.S.C.section 360bbb-3(b)(1), unless the authorization is terminated  or revoked  sooner.       Influenza A by PCR NEGATIVE NEGATIVE Final   Influenza B by PCR NEGATIVE NEGATIVE Final    Comment: (NOTE) The Xpert Xpress SARS-CoV-2/FLU/RSV plus assay is intended as an aid in the diagnosis of influenza from Nasopharyngeal swab specimens and should not be used as a sole basis for treatment. Nasal washings and aspirates are unacceptable for Xpert Xpress SARS-CoV-2/FLU/RSV testing.  Fact Sheet for Patients: EntrepreneurPulse.com.au  Fact Sheet for Healthcare  Providers: IncredibleEmployment.be  This test is not yet approved or cleared by the [place] FDA and has been authorized for detection and/or diagnosis of SARS-CoV-2 by FDA under an Emergency Use Authorization (EUA). This EUA will remain in effect (meaning this test can be used) for the duration of the COVID-19 declaration under Section 564(b)(1) of the Act, 21 U.S.C. section 360bbb-3(b)(1), unless the authorization is terminated or revoked.  Performed at [facility], [address] [place], [place] [address]   Culture, blood (routine x 2) Call MD if unable to obtain prior to antibiotics being given     Status: None (Preliminary result)   Collection Time: 11/22/21  7:03 PM   Specimen: BLOOD  Result Value Ref Range Status   Specimen Description BLOOD BLOOD LEFT HAND  Final   Special Requests   Final    BOTTLES DRAWN AEROBIC AND ANAEROBIC Blood Culture adequate volume   Culture   Final    NO GROWTH 3 DAYS Performed at [facility], [address], [place], [place] [address]    Report Status PENDING  Incomplete  Culture, blood (routine x 2) Call MD if unable to obtain prior to antibiotics being given     Status: None (Preliminary result)   Collection Time: 11/22/21  7:03 PM   Specimen: BLOOD  Result Value Ref Range Status   Specimen Description BLOOD BLOOD RIGHT WRIST  Final   Special Requests   Final    BOTTLES DRAWN AEROBIC AND ANAEROBIC Blood Culture adequate volume   Culture   Final    NO GROWTH 3 DAYS Performed at [facility], [address] [place], [place] [address]    Report Status PENDING  Incomplete         Radiology Studies: No results found.      Scheduled Meds:  arformoterol  15 mcg Nebulization BID   aspirin EC  81 mg Oral Daily   azithromycin  500 mg Oral Daily   benzonatate  200 mg Oral TID   budesonide (PULMICORT) nebulizer solution  0.25 mg Nebulization BID   enoxaparin (LOVENOX) injection   40 mg Subcutaneous Q24H   feeding supplement  237 mL Oral BID BM   guaiFENesin  1,200 mg Oral BID   insulin aspart  0-15 Units Subcutaneous TID WC   insulin aspart  0-5 Units Subcutaneous QHS   ipratropium-albuterol  3 mL Nebulization Q6H   lidocaine  1 patch Transdermal Q24H   losartan  100 mg Oral QPM   losartan  50 mg Oral Daily   metFORMIN  1,000 mg Oral BID WC   methylPREDNISolone (SOLU-MEDROL) injection  60 mg Intravenous Daily   multivitamin with minerals  1 tablet Oral Daily   nicotine  21 mg Transdermal Daily   polyethylene glycol  17 g Oral Daily   senna-docusate  1 tablet Oral BID   Continuous Infusions:  cefTRIAXone (ROCEPHIN)  IV 2 g (11/25/21 0959)     LOS: 2 days     Sidney Ace,  MD [facility]   If 7PM-7AM, please contact night-coverage  11/25/2021, 10:37 AM

## 2021-11-25 NOTE — Plan of Care (Signed)
?  Problem: Respiratory: ?Goal: Ability to maintain adequate ventilation will improve ?Outcome: Progressing ?Goal: Ability to maintain a clear airway will improve ?Outcome: Progressing ?  ?Pt is involved in and agrees with the plan of care. Hydralazine tab given for elevated bp. On oxygen at 3lpm/[place] last night but pt desat, increase back to 4LPm. Pt has nonproductive cough. Scheduled cough medicine given. Ambulates well to the bathroom. ?

## 2021-11-26 DIAGNOSIS — J441 Chronic obstructive pulmonary disease with (acute) exacerbation: Secondary | ICD-10-CM | POA: Diagnosis not present

## 2021-11-26 DIAGNOSIS — J189 Pneumonia, unspecified organism: Secondary | ICD-10-CM | POA: Diagnosis not present

## 2021-11-26 LAB — GLUCOSE, CAPILLARY
Glucose-Capillary: 84 mg/dL (ref 70–99)
Glucose-Capillary: 106 mg/dL — ABNORMAL HIGH (ref 70–99)
Glucose-Capillary: 150 mg/dL — ABNORMAL HIGH (ref 70–99)
Glucose-Capillary: 110 mg/dL — ABNORMAL HIGH (ref 70–99)

## 2021-11-26 MED ORDER — METHYLPREDNISOLONE SODIUM SUCC 40 MG IJ SOLR
40.0000 mg | Freq: Every day | INTRAMUSCULAR | Status: DC
  Administered 2021-11-27: 40 mg via INTRAVENOUS
  Filled 2021-11-26: qty 1

## 2021-11-26 NOTE — Progress Notes (Signed)
PROGRESS NOTE    Louis Sullivan  LKT:[id number] DOB: 1951-08-20 DOA: 11/22/2021 PCP: [facility]    Brief Narrative:  71 y.o. male with medical history significant of COPD Gold stage II 2020, chronic hypoxic respite failure on 2 L at bedtime, HTN, IIDM, CAD s/p stenting in 2008, cigarette smoking, presented with worsening of cough, shortness of breath and wheezing.   Symptoms started over the weekend, patient did some gardening in the morning, he thought he might contact pollen, and in the morning evening he started to have severe runny nose and postnasal drip.  Overnight he developed cough and wheezing, and cough has been productive with yellowish sputum.  And has been using nebulizer around-the-clock for last 2 days with minimal improvement.  He had episode of chills but no fever.  This morning, his wheezing and cough has become worse and he started to feel left-sided sharp-like chest pain worsening with cough and deep breath.  Patient has been improving albeit slowly.  He remains on 3 L nasal cannula at rest as of 3/12  Assessment & Plan:   Principal Problem:   Pneumonia Active Problems:   COPD with acute exacerbation (HCC)   HTN (hypertension)   Controlled type 2 diabetes mellitus without complication, without long-term current use of insulin ([place])   CAP (community acquired pneumonia)  Community-acquired pneumonia Acute exacerbation of COPD Acute on chronic hypoxic respiratory failure Patient with a nighttime requirement of 2 L at home Currently requiring 3 L by nasal cannula to maintain saturations greater than 90% Still with cough and and expiratory wheeze on exam Plan: Continue Rocephin 2 g every 24 hours Continue azithromycin 500 mg p.o. daily Continue IV steroids, wean dose to 40 mg daily starting tomorrow Twice daily Brovana Twice daily Pulmicort Every 4 DuoNebs Supplemental oxygen, wean as tolerated Attempt to wean to 2 L or less at rest   Essential  hypertension Adequate control over interval Continue PTA hydrochlorothiazide and lisinopril As needed IV hydralazine  Coronary artery disease Hyperlipidemia Aspirin and statin  Chest pain Now resolved Suspect pleuritis in the setting of pulmonary infection As needed pain control  Non-insulin-dependent diabetes mellitus Metformin 1000 g twice daily continue Sliding scale Carb modified diet  Tobacco use Counseled patient Offer nicotine patch  DVT prophylaxis: SQ Lovenox Code Status: Full Family Communication: None today.  Offered to call but patient declined Disposition Plan: Status is: Inpatient Remains inpatient appropriate because: Acute on chronic hypoxic respiratory failure secondary to decompensated COPD and community-acquired pneumonia.  Remains on 3 L nasal cannula   Level of care: Med-Surg  Consultants:  None  Procedures:  None  Antimicrobials: Ceftriaxone Azithromycin   Subjective: Patient seen and examined.  Slowly improving.  On 3 L at rest  Objective: Vitals:   11/26/21 0749 11/26/21 0756 11/26/21 0803 11/26/21 1108  BP: (!) 157/72     Pulse: (!) 101   90  Resp: 20     Temp: 98.1 F (36.7 C)     TempSrc: Oral     SpO2: 91% 91% 91% 96%  Weight:      Height:        Intake/Output Summary (Last 24 hours) at 11/26/2021 1130 Last data filed at 11/26/2021 1049 Gross per 24 hour  Intake 360 ml  Output 400 ml  Net -40 ml   Filed Weights   11/22/21 1655  Weight: 90.7 kg    Examination:  General exam: No acute distress Respiratory system: Diffuse end expiratory wheeze.  Coarse  breath sounds.  Normal work of breathing.  3 L Cardiovascular system: S1-S2, RRR, no murmurs, no pedal edema Gastrointestinal system: Soft, NT/ND, normal bowel sounds Central nervous system: Alert and oriented. No focal neurological deficits. Extremities: Symmetric 5 x 5 power. Skin: No rashes, lesions or ulcers Psychiatry: Judgement and insight appear normal. Mood  & affect appropriate.     Data Reviewed: I have personally reviewed following labs and imaging studies  CBC: Recent Labs  Lab 11/22/21 1702 11/23/21 0459  WBC 13.1* 12.5*  HGB 11.8* 14.4  HCT 39.7 46.7  MCV 97.5 93.2  PLT 196 409   Basic Metabolic Panel: Recent Labs  Lab 11/22/21 1702  NA 136  K 3.8  CL 99  CO2 26  GLUCOSE 111*  BUN 13  CREATININE 0.90  CALCIUM 8.9   GFR: Estimated Creatinine Clearance: 82 mL/min (by C-G formula based on SCr of 0.9 mg/dL). Liver Function Tests: No results for input(s): AST, ALT, ALKPHOS, BILITOT, PROT, ALBUMIN in the last 168 hours. No results for input(s): LIPASE, AMYLASE in the last 168 hours. No results for input(s): AMMONIA in the last 168 hours. Coagulation Profile: No results for input(s): INR, PROTIME in the last 168 hours. Cardiac Enzymes: No results for input(s): CKTOTAL, CKMB, CKMBINDEX, TROPONINI in the last 168 hours. BNP (last 3 results) No results for input(s): PROBNP in the last 8760 hours. HbA1C: No results for input(s): HGBA1C in the last 72 hours.  CBG: Recent Labs  Lab 11/25/21 0803 11/25/21 1130 11/25/21 1610 11/25/21 2118 11/26/21 0743  GLUCAP 96 116* 106* 101* 84   Lipid Profile: No results for input(s): CHOL, HDL, LDLCALC, TRIG, CHOLHDL, LDLDIRECT in the last 72 hours. Thyroid Function Tests: No results for input(s): TSH, T4TOTAL, FREET4, T3FREE, THYROIDAB in the last 72 hours. Anemia Panel: No results for input(s): VITAMINB12, FOLATE, FERRITIN, TIBC, IRON, RETICCTPCT in the last 72 hours. Sepsis Labs: No results for input(s): PROCALCITON, LATICACIDVEN in the last 168 hours.  Recent Results (from the past 240 hour(s))  Resp Panel by RT-PCR (Flu A&B, Covid) Nasopharyngeal Swab     Status: None   Collection Time: 11/22/21  5:02 PM   Specimen: Nasopharyngeal Swab; Nasopharyngeal(NP) swabs in vial transport medium  Result Value Ref Range Status   SARS Coronavirus 2 by RT PCR NEGATIVE NEGATIVE  Final    Comment: (NOTE) SARS-CoV-2 target nucleic acids are NOT DETECTED.  The SARS-CoV-2 RNA is generally detectable in upper respiratory specimens during the acute phase of infection. The lowest concentration of SARS-CoV-2 viral copies this assay can detect is 138 copies/mL. A negative result does not preclude SARS-Cov-2 infection and should not be used as the sole basis for treatment or other patient management decisions. A negative result may occur with  improper specimen collection/handling, submission of specimen other than nasopharyngeal swab, presence of viral mutation(s) within the areas targeted by this assay, and inadequate number of viral copies(<138 copies/mL). A negative result must be combined with clinical observations, patient history, and epidemiological information. The expected result is Negative.  Fact Sheet for Patients:  EntrepreneurPulse.com.au  Fact Sheet for Healthcare Providers:  IncredibleEmployment.be  This test is no t yet approved or cleared by the [place] FDA and  has been authorized for detection and/or diagnosis of SARS-CoV-2 by FDA under an Emergency Use Authorization (EUA). This EUA will remain  in effect (meaning this test can be used) for the duration of the COVID-19 declaration under Section 564(b)(1) of the Act, 21 U.S.C.section 360bbb-3(b)(1), unless the authorization  is terminated  or revoked sooner.       Influenza A by PCR NEGATIVE NEGATIVE Final   Influenza B by PCR NEGATIVE NEGATIVE Final    Comment: (NOTE) The Xpert Xpress SARS-CoV-2/FLU/RSV plus assay is intended as an aid in the diagnosis of influenza from Nasopharyngeal swab specimens and should not be used as a sole basis for treatment. Nasal washings and aspirates are unacceptable for Xpert Xpress SARS-CoV-2/FLU/RSV testing.  Fact Sheet for Patients: EntrepreneurPulse.com.au  Fact Sheet for Healthcare  Providers: IncredibleEmployment.be  This test is not yet approved or cleared by the [place] FDA and has been authorized for detection and/or diagnosis of SARS-CoV-2 by FDA under an Emergency Use Authorization (EUA). This EUA will remain in effect (meaning this test can be used) for the duration of the COVID-19 declaration under Section 564(b)(1) of the Act, 21 U.S.C. section 360bbb-3(b)(1), unless the authorization is terminated or revoked.  Performed at [facility], [address] [place], [place] [address]   Culture, blood (routine x 2) Call MD if unable to obtain prior to antibiotics being given     Status: None (Preliminary result)   Collection Time: 11/22/21  7:03 PM   Specimen: BLOOD  Result Value Ref Range Status   Specimen Description BLOOD BLOOD LEFT HAND  Final   Special Requests   Final    BOTTLES DRAWN AEROBIC AND ANAEROBIC Blood Culture adequate volume   Culture   Final    NO GROWTH 4 DAYS Performed at [facility], [address] [place], [place] [address]    Report Status PENDING  Incomplete  Culture, blood (routine x 2) Call MD if unable to obtain prior to antibiotics being given     Status: None (Preliminary result)   Collection Time: 11/22/21  7:03 PM   Specimen: BLOOD  Result Value Ref Range Status   Specimen Description BLOOD BLOOD RIGHT WRIST  Final   Special Requests   Final    BOTTLES DRAWN AEROBIC AND ANAEROBIC Blood Culture adequate volume   Culture   Final    NO GROWTH 4 DAYS Performed at [facility], [address], [place], [place] [address]    Report Status PENDING  Incomplete         Radiology Studies: No results found.      Scheduled Meds:  arformoterol  15 mcg Nebulization BID   aspirin EC  81 mg Oral Daily   azithromycin  500 mg Oral Daily   benzonatate  200 mg Oral TID   budesonide (PULMICORT) nebulizer solution  0.25 mg Nebulization BID   enoxaparin (LOVENOX) injection   40 mg Subcutaneous Q24H   feeding supplement  237 mL Oral BID BM   guaiFENesin  1,200 mg Oral BID   insulin aspart  0-15 Units Subcutaneous TID WC   insulin aspart  0-5 Units Subcutaneous QHS   ipratropium-albuterol  3 mL Nebulization Q6H   lidocaine  1 patch Transdermal Q24H   losartan  100 mg Oral QPM   losartan  50 mg Oral Daily   metFORMIN  1,000 mg Oral BID WC   methylPREDNISolone (SOLU-MEDROL) injection  60 mg Intravenous Daily   multivitamin with minerals  1 tablet Oral Daily   nicotine  21 mg Transdermal Daily   polyethylene glycol  17 g Oral Daily   senna-docusate  1 tablet Oral BID   Continuous Infusions:  cefTRIAXone (ROCEPHIN)  IV 2 g (11/26/21 1102)     LOS: 3 days  Sidney Ace, MD [facility]   If 7PM-7AM, please contact night-coverage  11/26/2021, 11:30 AM

## 2021-11-26 NOTE — Care Management Important Message (Signed)
Important Message ? ?Patient Details  ?Name: Louis Sullivan ?MRN: [id number] ?Date of Birth: [birth year] ? ? ?Medicare Important Message Given:  Yes ? ? ? ? ?Dannette Barbara ?11/26/2021, 11:24 AM ?

## 2021-11-26 NOTE — Plan of Care (Signed)
  Problem: Activity: Goal: Ability to tolerate increased activity will improve Outcome: Progressing   Problem: Clinical Measurements: Goal: Ability to maintain a body temperature in the normal range will improve Outcome: Progressing   

## 2021-11-26 NOTE — Progress Notes (Signed)
Physical Therapy Treatment ?Patient Details ?Name: Louis Sullivan ?MRN: [id number] ?DOB: 1951/03/07 ?Today's Date: 11/26/2021 ? ? ?History of Present Illness 71 yo male admitted on 3/9 was found to have SOB and productive cough, using increased supplemental O2 and has required extra breathing treatments wiht nebulizer.  Noted chest pain and wheezing.  PMHx:  COPD, HTN, DM, CAD with stents, smoker ? ?  ?PT Comments  ? ? Pt received upright in bed agreeable to PT services. Pt indep with bed mobility, donning pants in standing and ambulating in room. Pt started on 3L/min [place] with Spo2 at 93%. Pt reduced to  0.5 L/min via  with SPO2  maintaining >90% post 2 laps around nurses station and asc/desc 6 stairs with close supervision but no LOB appreciated. Pt titrated to RA in recliner with Spo2 at 92-93% so pt educated and left on RA. RN aware. D/c recs remain appropriate. Pt appears to be at baseline function. Plan to d/c orders as no acute needs identified. PT to sign off. ?  ?Recommendations for follow up therapy are one component of a multi-disciplinary discharge planning process, led by the attending physician.  Recommendations may be updated based on patient status, additional functional criteria and insurance authorization. ? ?Follow Up Recommendations ? No PT follow up ?  ?  ?Assistance Recommended at Discharge Intermittent Supervision/Assistance  ?Patient can return home with the following Assistance with cooking/housework ?  ?Equipment Recommendations ? None recommended by PT  ?  ?Recommendations for Other Services   ? ? ?  ?Precautions / Restrictions Precautions ?Precautions: Fall ?Precaution Comments: Monitor O2 ?Restrictions ?Weight Bearing Restrictions: No  ?  ? ?Mobility ? Bed Mobility ?Overal bed mobility: Modified Independent ?  ?  ?  ?  ?  ?  ?  ?Patient Response: Cooperative ? ?Transfers ?Overall transfer level: Modified independent ?  ?  ?  ?  ?  ?  ?  ?  ?  ?  ? ?Ambulation/Gait ?Ambulation/Gait  assistance: Supervision ?Gait Distance (Feet): 320 Feet ?Assistive device: None ?Gait Pattern/deviations: Step-through pattern, Narrow base of support ?  ?  ?  ?General Gait Details: tolerating gait on .5 L/min with SPO2 at 90-91% ? ? ?Stairs ?Stairs: Yes ?Stairs assistance: Supervision ?Stair Management: One rail Right, One rail Left, Alternating pattern, Forwards ?Number of Stairs: 6 ?General stair comments: x2 bouts asc/desc with step through pattern ? ? ?Wheelchair Mobility ?  ? ?Modified Rankin (Stroke Patients Only) ?  ? ? ?  ?Balance Overall balance assessment: No apparent balance deficits (not formally assessed) ?  ?  ?  ?  ?  ?  ?  ?  ?  ?  ?  ?  ?  ?  ?  ?  ?  ?  ?  ? ?  ?Cognition Arousal/Alertness: Awake/alert ?Behavior During Therapy: [facility] for tasks assessed/performed ?Overall Cognitive Status: Within Functional Limits for tasks assessed ?  ?  ?  ?  ?  ?  ?  ?  ?  ?  ?  ?  ?  ?  ?  ?  ?  ?  ?  ? ?  ?Exercises   ? ?  ?General Comments General comments (skin integrity, edema, etc.): Able to don pants in standingwith supervision. SPo2 on RA post mobility at 92-93%. Pt left on RA with RN notified. ?  ?  ? ?Pertinent Vitals/Pain Pain Assessment ?Pain Assessment: No/denies pain  ? ? ?Home Living   ?  ?  ?  ?  ?  ?  ?  ?  ?  ?   ?  ?  Prior Function    ?  ?  ?   ? ?PT Goals (current goals can now be found in the care plan section) Acute Rehab PT Goals ?Patient Stated Goal: to get back to work and grandchildren ?PT Goal Formulation: With patient ?Time For Goal Achievement: 12/01/21 ?Potential to Achieve Goals: Good ?Progress towards PT goals: Progressing toward goals ? ?  ?Frequency ? ? ? Min 2X/week ? ? ? ?  ?PT Plan Current plan remains appropriate  ? ? ?Co-evaluation   ?  ?  ?  ?  ? ?  ?AM-PAC PT "6 Clicks" Mobility   ?Outcome Measure ? Help needed turning from your back to your side while in a flat bed without using bedrails?: None ?Help needed moving from lying on your back to sitting on the side of a flat  bed without using bedrails?: None ?Help needed moving to and from a bed to a chair (including a wheelchair)?: None ?Help needed standing up from a chair using your arms (e.g., wheelchair or bedside chair)?: A Little ?Help needed to walk in hospital room?: A Little ?Help needed climbing 3-5 steps with a railing? : A Little ?6 Click Score: 21 ? ?  ?End of Session Equipment Utilized During Treatment: Gait belt;Oxygen ?Activity Tolerance: Patient tolerated treatment well ?Patient left: in chair;with call bell/phone within reach ?Nurse Communication: Mobility status ?PT Visit Diagnosis: Other abnormalities of gait and mobility (R26.89) ?  ? ? ?Time: 1191-4782 ?PT Time Calculation (min) (ACUTE ONLY): 16 min ? ?Charges:  $Therapeutic Exercise: 8-22 mins          ?          ?Salem Caster. Fairly IV, PT, DPT ?Physical Therapist- [place]  ?[facility]  ?11/26/2021, 11:14 AM ? ?

## 2021-11-27 LAB — CULTURE, BLOOD (ROUTINE X 2)
Culture: NO GROWTH
Culture: NO GROWTH
Special Requests: ADEQUATE
Special Requests: ADEQUATE

## 2021-11-27 LAB — GLUCOSE, CAPILLARY: Glucose-Capillary: 87 mg/dL (ref 70–99)

## 2021-11-27 MED ORDER — NICOTINE 21 MG/24HR TD PT24: 21.0000 mg | MEDICATED_PATCH | Freq: Every day | TRANSDERMAL | 0 refills | Status: DC

## 2021-11-27 MED ORDER — BENZONATATE 200 MG PO CAPS: 200.0000 mg | ORAL_CAPSULE | Freq: Three times a day (TID) | ORAL | 0 refills | Status: DC | PRN

## 2021-11-27 MED ORDER — PREDNISONE 20 MG PO TABS: 40.0000 mg | ORAL_TABLET | Freq: Every day | ORAL | 0 refills | Status: AC

## 2021-11-27 NOTE — Discharge Planning (Signed)
Discharge instructions reviewed with patient at bedside. Patient has no further questions or concerns at this time. PIV removed. Patient states he will be discharging home. Declines need for o2 tank to be delivered because he has a tank at home. SW aware ? ?

## 2021-11-27 NOTE — TOC Transition Note (Signed)
Transition of Care (TOC) - CM/SW Discharge Note ? ? ?Patient Details  ?Name: JERRICO COVELLO ?MRN: [id number] ?Date of Birth: May 30, 1951 ? ?Transition of Care (TOC) CM/SW Contact:  ?Candie Chroman, LCSW ?Phone Number: ?11/27/2021, 11:42 AM ? ? ?Clinical Narrative:  Patient has orders to discharge home today. Sats test complete. Patient dropped to 87% while ambulating on room air. Notified [employer] representative. They will deliver oxygen tank to the hospital before he leaves. Patient is aware. No further concerns. CSW signing off.  ? ?Final next level of care: Home/Self Care ?Barriers to Discharge: Barriers Resolved ? ? ?Patient Goals and CMS Choice ?  ?  ?  ? ?Discharge Placement ?  ?           ?  ?  ?  ?Patient and family notified of of transfer: 11/27/21 ? ?Discharge Plan and Services ?  ?  ?Post Acute Care Choice: NA          ?DME Arranged: Oxygen ?DME Agency: [employer] ?Date DME Agency Contacted: 11/27/21 ?  ?Representative spoke with at DME Agency: Caryl Pina ?  ?  ?  ?  ?  ? ?Social Determinants of Health (SDOH) Interventions ?  ? ? ?Readmission Risk Interventions ?No flowsheet data found. ? ? ? ? ?

## 2021-11-27 NOTE — Progress Notes (Signed)
SATURATION QUALIFICATIONS: (This note is used to comply with regulatory documentation for home oxygen) ? ?Patient Saturations on Room Air at Rest = 93% ? ?Patient Saturations on Room Air while Ambulating = 87% ? ?Patient Saturations on 2 Liters of oxygen while Ambulating = 95% ? ?Please briefly explain why patient needs home oxygen: patient needs oxygen while sleeping at night ?

## 2021-11-27 NOTE — Discharge Summary (Signed)
Physician Discharge Summary  ?Louis Sullivan KDX:[id number] DOB: 1951/07/22 DOA: 11/22/2021 ? ?PCP: [facility] ? ?Admit date: 11/22/2021 ?Discharge date: 11/27/2021 ? ?Admitted From: Home ?Disposition:  Home ? ?Recommendations for Outpatient Follow-up:  ?Follow up with PCP in 1-2 weeks ? ? ?Home Health:No ?Equipment/Devices:None  ? ?Discharge Condition:Stable  ?CODE STATUS:Full  ?Diet recommendation: Reg ? ?Brief/Interim Summary: ?chronic hypoxic respite failure on 2 L at bedtime, HTN, IIDM, CAD s/p stenting in 2008, cigarette smoking, presented with worsening of cough, shortness of breath and wheezing. ?  ?Symptoms started over the weekend, patient did some gardening in the morning, he thought he might contact pollen, and in the morning evening he started to have severe runny nose and postnasal drip.  Overnight he developed cough and wheezing, and cough has been productive with yellowish sputum.  And has been using nebulizer around-the-clock for last 2 days with minimal improvement.  He had episode of chills but no fever.  This morning, his wheezing and cough has become worse and he started to feel left-sided sharp-like chest pain worsening with cough and deep breath. ?  ?Patient has been improving albeit slowly.  He remains on 3 L nasal cannula at rest as of 3/12 ? ?Patient weaned to room air at time of discharge.  Still with some persistent wheezing but work of breathing has improved.  Air movement has improved.  Ambulated without desaturation.  Cleared for discharge home.  Will recommend prednisone 40 mg a day x5 days.  Patient states he has home inhalers and nebulizers and will resume home with home bronchodilator regimen. ? ? ? ?Discharge Diagnoses:  ?Principal Problem: ?  Pneumonia ?Active Problems: ?  COPD with acute exacerbation ([place]) ?  HTN (hypertension) ?  Controlled type 2 diabetes mellitus without complication, without long-term current use of insulin ([place]) ?  CAP (community acquired  pneumonia) ? ?Community-acquired pneumonia ?Acute exacerbation of COPD ?Acute on chronic hypoxic respiratory failure ?Patient with a nighttime requirement of 2 L at home ?Currently requiring 3 L by nasal cannula to maintain saturations greater than 90% ?Still with cough and and expiratory wheeze on exam ?Plan: ?Completed antibiotic course in house.  No antibiotics indicated on discharge.  Will recommend prednisone 40 mg a day x5 days.  Patient to resume home bronchodilator regimen.  Ambulated without desaturation prior to discharge.  Can resume home nighttime requirement of 2 L via nasal cannula ?  ?  ?Essential hypertension ?Adequate control over interval ?Continue PTA hydrochlorothiazide and lisinopril ?  ?Coronary artery disease ?Hyperlipidemia ?Aspirin and statin ?  ?Chest pain ?Now resolved ?Suspect pleuritis in the setting of pulmonary infection ?As needed pain control ?  ?Non-insulin-dependent diabetes mellitus ?Metformin 1000 g twice daily continue ?Sliding scale ?Carb modified diet ?  ?Tobacco use ?Counseled patient ?Offer nicotine patch ? ?Discharge Instructions ? ?Discharge Instructions   ? ? Diet - low sodium heart healthy   Complete by: As directed ?  ? Increase activity slowly   Complete by: As directed ?  ? ?  ? ?Allergies as of 11/27/2021   ?No Known Allergies ?  ? ?  ?Medication List  ?  ? ?TAKE these medications   ? ?albuterol 108 (90 Base) MCG/ACT inhaler ?Commonly known as: VENTOLIN HFA ?Inhale 2 puffs into the lungs every 4 (four) hours as needed for wheezing or shortness of breath. ?  ?aspirin EC 81 MG tablet ?Take 81 mg by mouth daily. ?  ?atorvastatin 40 MG tablet ?Commonly known as: LIPITOR ?Take 40  mg by mouth daily. ?  ?benzonatate 200 MG capsule ?Commonly known as: TESSALON ?Take 1 capsule (200 mg total) by mouth 3 (three) times daily as needed for cough. ?  ?losartan 100 MG tablet ?Commonly known as: COZAAR ?Take 100 mg by mouth every evening. ?  ?losartan 50 MG tablet ?Commonly known as:  COZAAR ?Take 50 mg by mouth in the morning. ?  ?metFORMIN 1000 MG tablet ?Commonly known as: GLUCOPHAGE ?Take 1,000 mg by mouth daily with breakfast. ?  ?nicotine 21 mg/24hr patch ?Commonly known as: NICODERM CQ - dosed in mg/24 hours ?Place 1 patch (21 mg total) onto the skin daily. ?Start taking on: November 28, 2021 ?  ?predniSONE 20 MG tablet ?Commonly known as: DELTASONE ?Take 2 tablets (40 mg total) by mouth daily for 5 days. ?  ?traZODone 50 MG tablet ?Commonly known as: DESYREL ?Take 50 mg by mouth at bedtime. ?  ? ?  ? ?  ?  ? ? ?  ?Durable Medical Equipment  ?(From admission, onward)  ?  ? ? ?  ? ?  Start     Ordered  ? 11/27/21 0845  For home use only DME oxygen  Once       ?Question Answer Comment  ?Length of Need Lifetime   ?Mode or (Route) Nasal cannula   ?Liters per Minute 2   ?Frequency Continuous (stationary and portable oxygen unit needed)   ?Oxygen conserving device Yes   ?Oxygen delivery system Gas   ?  ? 11/27/21 0844  ? ?  ?  ? ?  ? ? ?No Known Allergies ? ?Consultations: ?None ? ? ?Procedures/Studies: ?DG Chest Port 1 View ? ?Result Date: 11/22/2021 ?CLINICAL DATA:  Chest pain EXAM: PORTABLE CHEST 1 VIEW COMPARISON:  04/29/2019 FINDINGS: Patchy consolidation and ground-glass opacity in the left mid to lower lung. No pleural effusion. Normal cardiac size. No pneumothorax IMPRESSION: Patchy consolidation and ground-glass opacity in the left mid to lower lung suspicious for pneumonia. Radiographic follow-up to resolution is recommended. Electronically Signed   By: Donavan Foil M.D.   On: 11/22/2021 17:57   ? ? ? ?Subjective: ?Seen and examined on day of discharge.  Stable no distress.  Stable for discharge home. ? ?Discharge Exam: ?Vitals:  ? 11/27/21 0459 11/27/21 0758  ?BP: (!) 146/63 (!) 157/85  ?Pulse: 65 65  ?Resp: 18 18  ?Temp: 97.7 ?F (36.5 ?C) 97.6 ?F (36.4 ?C)  ?SpO2: 92% 91%  ? ?Vitals:  ? 11/26/21 1936 11/26/21 2000 11/27/21 0459 11/27/21 0758  ?BP:  (!) 146/72 (!) 146/63 (!) 157/85   ?Pulse:  88 65 65  ?Resp:  18 18 18   ?Temp:  97.7 ?F (36.5 ?C) 97.7 ?F (36.5 ?C) 97.6 ?F (36.4 ?C)  ?TempSrc:  Oral  Oral  ?SpO2: 94% 95% 92% 91%  ?Weight:      ?Height:      ? ? ?General: Pt is alert, awake, not in acute distress ?Cardiovascular: RRR, S1/S2 +, no rubs, no gallops ?Respiratory: CTA bilaterally, no wheezing, no rhonchi ?Abdominal: Soft, NT, ND, bowel sounds + ?Extremities: no edema, no cyanosis ? ? ? ?The results of significant diagnostics from this hospitalization (including imaging, microbiology, ancillary and laboratory) are listed below for reference.   ? ? ?Microbiology: ?Recent Results (from the past 240 hour(s))  ?Resp Panel by RT-PCR (Flu A&B, Covid) Nasopharyngeal Swab     Status: None  ? Collection Time: 11/22/21  5:02 PM  ? Specimen: Nasopharyngeal Swab; Nasopharyngeal(NP) swabs in vial  transport medium  ?Result Value Ref Range Status  ? SARS Coronavirus 2 by RT PCR NEGATIVE NEGATIVE Final  ?  Comment: (NOTE) ?SARS-CoV-2 target nucleic acids are NOT DETECTED. ? ?The SARS-CoV-2 RNA is generally detectable in upper respiratory ?specimens during the acute phase of infection. The lowest ?concentration of SARS-CoV-2 viral copies this assay can detect is ?138 copies/mL. A negative result does not preclude SARS-Cov-2 ?infection and should not be used as the sole basis for treatment or ?other patient management decisions. A negative result may occur with  ?improper specimen collection/handling, submission of specimen other ?than nasopharyngeal swab, presence of viral mutation(s) within the ?areas targeted by this assay, and inadequate number of viral ?copies(<138 copies/mL). A negative result must be combined with ?clinical observations, patient history, and epidemiological ?information. The expected result is Negative. ? ?Fact Sheet for Patients:  ?EntrepreneurPulse.com.au ? ?Fact Sheet for Healthcare Providers:  ?IncredibleEmployment.be ? ?This test is no t  yet approved or cleared by the [place] FDA and  ?has been authorized for detection and/or diagnosis of SARS-CoV-2 by ?FDA under an Emergency Use Authorization (EUA). This EUA will remain  ?in effect (meaning this tes

## 2021-12-15 DIAGNOSIS — J189 Pneumonia, unspecified organism: Secondary | ICD-10-CM

## 2021-12-15 HISTORY — DX: Pneumonia, unspecified organism: J18.9

## 2022-01-15 ENCOUNTER — Other Ambulatory Visit: Payer: Self-pay | Admitting: Specialist

## 2022-01-15 DIAGNOSIS — R053 Chronic cough: Secondary | ICD-10-CM

## 2022-01-15 DIAGNOSIS — F17210 Nicotine dependence, cigarettes, uncomplicated: Secondary | ICD-10-CM

## 2022-01-15 DIAGNOSIS — R918 Other nonspecific abnormal finding of lung field: Secondary | ICD-10-CM

## 2022-01-18 ENCOUNTER — Other Ambulatory Visit: Payer: Self-pay | Admitting: Pulmonary Disease

## 2022-01-18 ENCOUNTER — Encounter
Admission: RE | Admit: 2022-01-18 | Discharge: 2022-01-18 | Disposition: A | Discharge status: Home / Self Care | Payer: Medicare Other | Source: Ambulatory Visit | Attending: Pulmonary Disease | Admitting: Pulmonary Disease

## 2022-01-18 DIAGNOSIS — R918 Other nonspecific abnormal finding of lung field: Secondary | ICD-10-CM

## 2022-01-18 DIAGNOSIS — R0602 Shortness of breath: Secondary | ICD-10-CM

## 2022-01-18 DIAGNOSIS — J189 Pneumonia, unspecified organism: Secondary | ICD-10-CM

## 2022-01-18 DIAGNOSIS — I2510 Atherosclerotic heart disease of native coronary artery without angina pectoris: Secondary | ICD-10-CM

## 2022-01-18 DIAGNOSIS — Z01818 Encounter for other preprocedural examination: Secondary | ICD-10-CM

## 2022-01-18 DIAGNOSIS — E119 Type 2 diabetes mellitus without complications: Secondary | ICD-10-CM

## 2022-01-18 DIAGNOSIS — J441 Chronic obstructive pulmonary disease with (acute) exacerbation: Secondary | ICD-10-CM

## 2022-01-18 HISTORY — DX: Hyperlipidemia, unspecified: E78.5

## 2022-01-18 HISTORY — DX: Headache, unspecified: R51.9

## 2022-01-18 HISTORY — DX: Chronic obstructive pulmonary disease, unspecified: J44.9

## 2022-01-18 HISTORY — DX: Dyspnea, unspecified: R06.00

## 2022-01-18 HISTORY — DX: Type 2 diabetes mellitus without complications: E11.9

## 2022-01-18 NOTE — Patient Instructions (Addendum)
Your procedure is scheduled on:01-23-22 Wednesday ?Report to the Registration Desk on the 1st floor of the [facility].Then proceed to the 2nd floor Surgery Desk ?To find out your arrival time, please call [phone] between 1PM - 3PM on:01-22-22 Tuesday ?If your arrival time is 6:00 am, do not arrive prior to that time as the [facility] entrance doors do not open until 6:00 am. ? ?REMEMBER: ?Instructions that are not followed completely may result in serious medical risk, up to and including death; or upon the discretion of your surgeon and anesthesiologist your surgery may need to be rescheduled. ? ?Do not eat food after midnight the night before surgery.  ?No gum chewing, lozengers or hard candies. ? ?You may however, drink Water up to 2 hours before you are scheduled to arrive for your surgery. Do not drink anything within 2 hours of your scheduled arrival time. ? ?Type 1 and Type 2 diabetics should only drink water. ? ?TAKE THESE MEDICATIONS THE MORNING OF SURGERY WITH A SIP OF WATER: ?-atorvastatin (LIPITOR) ? ?Use your Albuterol Nebulizer the morning of surgery and bring your Albuterol Inhaler to the hospital ? ?Stop your 81 mg Aspirin NOW (01-18-22) as instructed by Dr Lanney Gins ? ?Stop your metFORMIN (GLUCOPHAGE) 2 days prior to surgery-Last dose on 01-20-22 Sunday ? ?One week prior to surgery: ?Stop Anti-inflammatories (NSAIDS) such as Advil, Aleve, Ibuprofen, Motrin, Naproxen, Naprosyn and Aspirin based products such as Excedrin, Goodys Powder, BC Powder.You may however, take Tylenol if needed for pain up until the day of surgery. ?Stop ANY OVER THE COUNTER supplements/vitamins NOW (01-18-22) until after surgery (Vitamin D3) ? ?No Alcohol for 24 hours before or after surgery. ? ?No Smoking including e-cigarettes for 24 hours prior to surgery.  ?No chewable tobacco products for at least 6 hours prior to surgery.  ?No nicotine patches on the day of surgery. ? ?Do not use any "recreational" drugs for at least  a week prior to your surgery.  ?Please be advised that the combination of cocaine and anesthesia may have negative outcomes, up to and including death. ?If you test positive for cocaine, your surgery will be cancelled. ? ?On the morning of surgery brush your teeth with toothpaste and water, you may rinse your mouth with mouthwash if you wish. ?Do not swallow any toothpaste or mouthwash. ? ? ?Do not wear jewelry, make-up, hairpins, clips or nail polish. ? ?Do not wear lotions, powders, or perfumes.  ? ?Do not shave body from the neck down 48 hours prior to surgery just in case you cut yourself which could leave a site for infection.  ?Also, freshly shaved skin may become irritated if using the CHG soap. ? ?Contact lenses, hearing aids and dentures may not be worn into surgery. ? ?Do not bring valuables to the hospital. [facility] is not responsible for any missing/lost belongings or valuables.  ? ?Notify your doctor if there is any change in your medical condition (cold, fever, infection). ? ?Wear comfortable clothing (specific to your surgery type) to the hospital. ? ?After surgery, you can help prevent lung complications by doing breathing exercises.  ?Take deep breaths and cough every 1-2 hours. Your doctor may order a device called an Incentive Spirometer to help you take deep breaths. ?When coughing or sneezing, hold a pillow firmly against your incision with both hands. This is called ?splinting.? Doing this helps protect your incision. It also decreases belly discomfort. ? ?If you are being admitted to the hospital overnight, leave your  suitcase in the car. ?After surgery it may be brought to your room. ? ?If you are being discharged the day of surgery, you will not be allowed to drive home. ?You will need a responsible adult (18 years or older) to drive you home and stay with you that night.  ? ?If you are taking public transportation, you will need to have a responsible adult (18 years or older) with  you. ?Please confirm with your physician that it is acceptable to use public transportation.  ? ?Please call the [facility] Dept. at [phone] if you have any questions about these instructions. ? ?Surgery Visitation Policy: ? ?Patients undergoing a surgery or procedure may have two family members or support persons with them as long as the person is not COVID-19 positive or experiencing its symptoms. ?

## 2022-01-21 ENCOUNTER — Ambulatory Visit
Admission: RE | Admit: 2022-01-21 | Discharge: 2022-01-21 | Disposition: A | Discharge status: Home / Self Care | Payer: Medicare Other | Source: Ambulatory Visit | Attending: Pulmonary Disease | Admitting: Pulmonary Disease

## 2022-01-21 ENCOUNTER — Encounter: Payer: Self-pay | Admitting: Urgent Care

## 2022-01-21 ENCOUNTER — Encounter
Admission: RE | Admit: 2022-01-21 | Discharge: 2022-01-21 | Disposition: A | Discharge status: Home / Self Care | Payer: Medicare Other | Source: Ambulatory Visit | Attending: Pulmonary Disease | Admitting: Pulmonary Disease

## 2022-01-21 ENCOUNTER — Other Ambulatory Visit: Payer: Medicare Other

## 2022-01-21 ENCOUNTER — Other Ambulatory Visit: Admission: RE | Admit: 2022-01-21 | Payer: Medicare Other | Source: Ambulatory Visit

## 2022-01-21 DIAGNOSIS — Z01818 Encounter for other preprocedural examination: Secondary | ICD-10-CM

## 2022-01-21 DIAGNOSIS — R0602 Shortness of breath: Secondary | ICD-10-CM | POA: Insufficient documentation

## 2022-01-21 DIAGNOSIS — E119 Type 2 diabetes mellitus without complications: Secondary | ICD-10-CM

## 2022-01-21 DIAGNOSIS — Z20822 Contact with and (suspected) exposure to covid-19: Secondary | ICD-10-CM | POA: Insufficient documentation

## 2022-01-21 DIAGNOSIS — J441 Chronic obstructive pulmonary disease with (acute) exacerbation: Secondary | ICD-10-CM | POA: Insufficient documentation

## 2022-01-21 DIAGNOSIS — I2510 Atherosclerotic heart disease of native coronary artery without angina pectoris: Secondary | ICD-10-CM | POA: Insufficient documentation

## 2022-01-21 DIAGNOSIS — J439 Emphysema, unspecified: Secondary | ICD-10-CM | POA: Insufficient documentation

## 2022-01-21 DIAGNOSIS — I700 Atherosclerosis of aorta: Secondary | ICD-10-CM | POA: Insufficient documentation

## 2022-01-21 DIAGNOSIS — R918 Other nonspecific abnormal finding of lung field: Secondary | ICD-10-CM | POA: Diagnosis present

## 2022-01-21 DIAGNOSIS — J189 Pneumonia, unspecified organism: Secondary | ICD-10-CM

## 2022-01-21 DIAGNOSIS — Z1152 Encounter for screening for COVID-19: Secondary | ICD-10-CM

## 2022-01-21 DIAGNOSIS — Z87891 Personal history of nicotine dependence: Secondary | ICD-10-CM | POA: Diagnosis not present

## 2022-01-21 DIAGNOSIS — J984 Other disorders of lung: Secondary | ICD-10-CM | POA: Diagnosis not present

## 2022-01-21 LAB — APTT: aPTT: 30 seconds (ref 24–36)

## 2022-01-21 LAB — CBC
HCT: 49.4 % (ref 39.0–52.0)
Hemoglobin: 15.2 g/dL (ref 13.0–17.0)
MCH: 28.8 pg (ref 26.0–34.0)
MCHC: 30.8 g/dL (ref 30.0–36.0)
MCV: 93.7 fL (ref 80.0–100.0)
Platelets: 231 10*3/uL (ref 150–400)
RBC: 5.27 MIL/uL (ref 4.22–5.81)
RDW: 13.5 % (ref 11.5–15.5)
WBC: 7 10*3/uL (ref 4.0–10.5)
nRBC: 0 % (ref 0.0–0.2)

## 2022-01-21 LAB — PROTIME-INR
INR: 1.1 (ref 0.8–1.2)
Prothrombin Time: 14.1 seconds (ref 11.4–15.2)

## 2022-01-22 ENCOUNTER — Encounter: Admission: RE | Admit: 2022-01-22 | Payer: Medicare Other | Source: Ambulatory Visit

## 2022-01-22 LAB — SARS CORONAVIRUS 2 (TAT 6-24 HRS): SARS Coronavirus 2: NEGATIVE

## 2022-01-22 MED ORDER — FAMOTIDINE 20 MG PO TABS
20.0000 mg | ORAL_TABLET | Freq: Once | ORAL | Status: DC
Start: 2022-01-22 — End: 2022-01-23

## 2022-01-22 MED ORDER — ORAL CARE MOUTH RINSE: 15.0000 mL | Freq: Once | OROMUCOSAL | Status: AC

## 2022-01-22 MED ORDER — CHLORHEXIDINE GLUCONATE 0.12 % MT SOLN
15.0000 mL | Freq: Once | OROMUCOSAL | Status: AC
Start: 2022-01-22 — End: 2022-01-23
  Administered 2022-01-23: 15 mL via OROMUCOSAL

## 2022-01-22 MED ORDER — SODIUM CHLORIDE 0.9 % IV SOLN: INTRAVENOUS | Status: DC

## 2022-01-23 ENCOUNTER — Ambulatory Visit: Payer: Medicare Other | Admitting: Urgent Care

## 2022-01-23 ENCOUNTER — Encounter
Admission: RE | Disposition: A | Discharge status: Home / Self Care | Payer: Self-pay | Source: Home / Self Care | Attending: Pulmonary Disease

## 2022-01-23 ENCOUNTER — Ambulatory Visit: Payer: Medicare Other

## 2022-01-23 ENCOUNTER — Ambulatory Visit
Admission: RE | Admit: 2022-01-23 | Discharge: 2022-01-23 | Disposition: A | Discharge status: Home / Self Care | Payer: Medicare Other | Attending: Pulmonary Disease | Admitting: Pulmonary Disease

## 2022-01-23 ENCOUNTER — Ambulatory Visit: Payer: Medicare Other | Admitting: Anesthesiology

## 2022-01-23 DIAGNOSIS — Z79899 Other long term (current) drug therapy: Secondary | ICD-10-CM | POA: Diagnosis not present

## 2022-01-23 DIAGNOSIS — E785 Hyperlipidemia, unspecified: Secondary | ICD-10-CM | POA: Diagnosis not present

## 2022-01-23 DIAGNOSIS — E119 Type 2 diabetes mellitus without complications: Secondary | ICD-10-CM | POA: Diagnosis not present

## 2022-01-23 DIAGNOSIS — J449 Chronic obstructive pulmonary disease, unspecified: Secondary | ICD-10-CM | POA: Diagnosis not present

## 2022-01-23 DIAGNOSIS — R918 Other nonspecific abnormal finding of lung field: Secondary | ICD-10-CM | POA: Diagnosis present

## 2022-01-23 DIAGNOSIS — C3411 Malignant neoplasm of upper lobe, right bronchus or lung: Secondary | ICD-10-CM | POA: Insufficient documentation

## 2022-01-23 DIAGNOSIS — Z9981 Dependence on supplemental oxygen: Secondary | ICD-10-CM | POA: Insufficient documentation

## 2022-01-23 DIAGNOSIS — I2510 Atherosclerotic heart disease of native coronary artery without angina pectoris: Secondary | ICD-10-CM | POA: Diagnosis not present

## 2022-01-23 DIAGNOSIS — Z87891 Personal history of nicotine dependence: Secondary | ICD-10-CM | POA: Insufficient documentation

## 2022-01-23 HISTORY — PX: VIDEO BRONCHOSCOPY WITH ENDOBRONCHIAL ULTRASOUND: SHX6177

## 2022-01-23 HISTORY — PX: VIDEO BRONCHOSCOPY WITH ENDOBRONCHIAL NAVIGATION: SHX6175

## 2022-01-23 LAB — GLUCOSE, CAPILLARY
Glucose-Capillary: 108 mg/dL — ABNORMAL HIGH (ref 70–99)
Glucose-Capillary: 128 mg/dL — ABNORMAL HIGH (ref 70–99)

## 2022-01-23 SURGERY — VIDEO BRONCHOSCOPY WITH ENDOBRONCHIAL NAVIGATION
Anesthesia: General

## 2022-01-23 MED ORDER — PHENYLEPHRINE 80 MCG/ML (10ML) SYRINGE FOR IV PUSH (FOR BLOOD PRESSURE SUPPORT)
PREFILLED_SYRINGE | INTRAVENOUS | Status: AC
  Filled 2022-01-23: qty 10

## 2022-01-23 MED ORDER — IPRATROPIUM-ALBUTEROL 0.5-2.5 (3) MG/3ML IN SOLN
RESPIRATORY_TRACT | Status: AC
  Filled 2022-01-23: qty 3

## 2022-01-23 MED ORDER — CHLORHEXIDINE GLUCONATE 0.12 % MT SOLN
OROMUCOSAL | Status: AC
  Filled 2022-01-23: qty 15

## 2022-01-23 MED ORDER — IPRATROPIUM-ALBUTEROL 0.5-2.5 (3) MG/3ML IN SOLN
RESPIRATORY_TRACT | Status: AC
  Administered 2022-01-23: 3 mL via RESPIRATORY_TRACT
  Filled 2022-01-23: qty 3

## 2022-01-23 MED ORDER — PROPOFOL 10 MG/ML IV BOLUS
INTRAVENOUS | Status: DC | PRN
  Administered 2022-01-23: 150 mg via INTRAVENOUS

## 2022-01-23 MED ORDER — PROPOFOL 10 MG/ML IV BOLUS
INTRAVENOUS | Status: AC
  Filled 2022-01-23: qty 20

## 2022-01-23 MED ORDER — DEXAMETHASONE SODIUM PHOSPHATE 10 MG/ML IJ SOLN
INTRAMUSCULAR | Status: DC | PRN
  Administered 2022-01-23: 4 mg via INTRAVENOUS

## 2022-01-23 MED ORDER — METOPROLOL TARTRATE 5 MG/5ML IV SOLN
INTRAVENOUS | Status: AC
  Filled 2022-01-23: qty 5

## 2022-01-23 MED ORDER — IPRATROPIUM-ALBUTEROL 0.5-2.5 (3) MG/3ML IN SOLN: 3.0000 mL | Freq: Once | RESPIRATORY_TRACT | Status: AC

## 2022-01-23 MED ORDER — SUGAMMADEX SODIUM 200 MG/2ML IV SOLN
INTRAVENOUS | Status: DC | PRN
  Administered 2022-01-23: 200 mg via INTRAVENOUS

## 2022-01-23 MED ORDER — LIDOCAINE HCL (PF) 2 % IJ SOLN
INTRAMUSCULAR | Status: AC
  Filled 2022-01-23: qty 5

## 2022-01-23 MED ORDER — LIDOCAINE HCL URETHRAL/MUCOSAL 2 % EX GEL
1.0000 "application " | Freq: Once | CUTANEOUS | Status: DC
  Filled 2022-01-23: qty 5

## 2022-01-23 MED ORDER — BUTAMBEN-TETRACAINE-BENZOCAINE 2-2-14 % EX AERO: 1.0000 | INHALATION_SPRAY | Freq: Once | CUTANEOUS | Status: DC

## 2022-01-23 MED ORDER — LIDOCAINE HCL (CARDIAC) PF 100 MG/5ML IV SOSY
PREFILLED_SYRINGE | INTRAVENOUS | Status: DC | PRN
  Administered 2022-01-23: 100 mg via INTRAVENOUS

## 2022-01-23 MED ORDER — MIDAZOLAM HCL 2 MG/2ML IJ SOLN
INTRAMUSCULAR | Status: AC
  Filled 2022-01-23: qty 2

## 2022-01-23 MED ORDER — FENTANYL CITRATE (PF) 100 MCG/2ML IJ SOLN
INTRAMUSCULAR | Status: AC
  Filled 2022-01-23: qty 2

## 2022-01-23 MED ORDER — METOPROLOL TARTRATE 5 MG/5ML IV SOLN
INTRAVENOUS | Status: DC | PRN
  Administered 2022-01-23: 2 mg via INTRAVENOUS

## 2022-01-23 MED ORDER — LIDOCAINE HCL (PF) 1 % IJ SOLN
30.0000 mL | Freq: Once | INTRAMUSCULAR | Status: DC
  Filled 2022-01-23: qty 30

## 2022-01-23 MED ORDER — FAMOTIDINE 20 MG PO TABS
20.0000 mg | ORAL_TABLET | Freq: Once | ORAL | Status: AC
Start: 2022-01-23 — End: 2022-01-23
  Administered 2022-01-23: 20 mg via ORAL

## 2022-01-23 MED ORDER — FENTANYL CITRATE (PF) 100 MCG/2ML IJ SOLN
INTRAMUSCULAR | Status: DC | PRN
  Administered 2022-01-23 (×2): 50 ug via INTRAVENOUS

## 2022-01-23 MED ORDER — PHENYLEPHRINE 80 MCG/ML (10ML) SYRINGE FOR IV PUSH (FOR BLOOD PRESSURE SUPPORT)
PREFILLED_SYRINGE | INTRAVENOUS | Status: DC | PRN
  Administered 2022-01-23: 160 ug via INTRAVENOUS
  Administered 2022-01-23 (×3): 80 ug via INTRAVENOUS
  Administered 2022-01-23 (×2): 160 ug via INTRAVENOUS
  Administered 2022-01-23: 200 ug via INTRAVENOUS

## 2022-01-23 MED ORDER — ONDANSETRON HCL 4 MG/2ML IJ SOLN
INTRAMUSCULAR | Status: AC
  Filled 2022-01-23: qty 2

## 2022-01-23 MED ORDER — ONDANSETRON HCL 4 MG/2ML IJ SOLN
INTRAMUSCULAR | Status: DC | PRN
  Administered 2022-01-23: 4 mg via INTRAVENOUS

## 2022-01-23 MED ORDER — PHENYLEPHRINE HCL 0.25 % NA SOLN: 1.0000 | Freq: Four times a day (QID) | NASAL | Status: DC | PRN

## 2022-01-23 MED ORDER — ROCURONIUM BROMIDE 100 MG/10ML IV SOLN
INTRAVENOUS | Status: DC | PRN
  Administered 2022-01-23: 60 mg via INTRAVENOUS

## 2022-01-23 MED ORDER — FAMOTIDINE 20 MG PO TABS
ORAL_TABLET | ORAL | Status: AC
  Filled 2022-01-23: qty 1

## 2022-01-23 MED ORDER — IPRATROPIUM-ALBUTEROL 0.5-2.5 (3) MG/3ML IN SOLN
3.0000 mL | RESPIRATORY_TRACT | Status: DC
  Administered 2022-01-23: 3 mL via RESPIRATORY_TRACT

## 2022-01-23 MED ORDER — DEXAMETHASONE SODIUM PHOSPHATE 10 MG/ML IJ SOLN
INTRAMUSCULAR | Status: AC
  Filled 2022-01-23: qty 1

## 2022-01-23 NOTE — Anesthesia Procedure Notes (Signed)
Procedure Name: Intubation ?Date/Time: 01/23/2022 12:55 PM ?Performed by: Tollie Eth, CRNA ?Pre-anesthesia Checklist: Patient identified, Patient being monitored, Timeout performed, Emergency Drugs available and Suction available ?Patient Re-evaluated:Patient Re-evaluated prior to induction ?Oxygen Delivery Method: Circle system utilized ?Preoxygenation: Pre-oxygenation with 100% oxygen ?Induction Type: IV induction ?Ventilation: Mask ventilation without difficulty ?Laryngoscope Size: McGraph and 4 ?Grade View: Grade I ?Tube type: Oral ?Tube size: 8.5 mm ?Number of attempts: 1 ?Airway Equipment and Method: Stylet ?Placement Confirmation: ETT inserted through vocal cords under direct vision, positive ETCO2 and breath sounds checked- equal and bilateral ?Secured at: 22 cm ?Tube secured with: Tape ?Dental Injury: Teeth and Oropharynx as per pre-operative assessment  ? ? ? ? ?

## 2022-01-23 NOTE — Consult Note (Signed)
? ? ? ?PULMONOLOGY ? ? ? ? ? ? ? ? ?Date: 01/23/2022,   ?MRN# [id number] MATAIO MELE 1950-11-28 ? ? ?  ?AdmissionWeight: 95.3 kg                 ?CurrentWeight: 95.3 kg ? ?Referring provider: Dr. Raul Del ? ? ?CHIEF COMPLAINT:  ? ?Lung mass with hilar and mediastinal lymphadenopathy ? ? ?HISTORY OF PRESENT ILLNESS  ? ?Patient has a history of advanced COPD lifelong smoking, hypertension dyslipidemia diabetes recurrent headaches, he had CT chest performed at [facility] as well as repeat CT chest performed Jan 18, 2022 with findings of a thick-walled cavitary lesion approximately 3 cm at the right upper lobe abutting the major fissure highly concerning for primary lung malignancy or metastasis.  There is also left hilar adenopathy.  Patient is here today for navigational bronchoscopy and endobronchial ultrasound assisted lymph node biopsies. ? ? ?PAST MEDICAL HISTORY  ? ?Past Medical History:  ?Diagnosis Date  ? COPD (chronic obstructive pulmonary disease) ([place])   ? Coronary artery disease   ? 1 stent placed in about 2008  ? Diabetes mellitus without complication ([place])   ? Dyspnea   ? Headache   ? migraines  ? Hyperlipidemia   ? Hypertension   ? Pneumonia 12/2021  ? hospitalized at [facility]  ? ? ? ?SURGICAL HISTORY  ? ?Past Surgical History:  ?Procedure Laterality Date  ? CARDIAC CATHETERIZATION    ? COLONOSCOPY    ? ? ? ?FAMILY HISTORY  ? ?History reviewed. No pertinent family history. ? ? ?SOCIAL HISTORY  ? ?Social History  ? ?Tobacco Use  ? Smoking status: Former  ?  Packs/day: 1.00  ?  Years: 20.00  ?  Pack years: 20.00  ?  Types: Cigarettes  ?  Quit date: 01/03/2022  ?  Years since quitting: 0.0  ? Smokeless tobacco: Never  ?Vaping Use  ? Vaping Use: Never used  ?Substance Use Topics  ? Alcohol use: Not Currently  ? Drug use: Never  ? ? ? ?MEDICATIONS  ? ? ?Home Medication:  ?  ?Current Medication: ? ?Current Facility-Administered Medications:  ?  0.9 %  sodium chloride infusion, , Intravenous, Continuous, Darrin Nipper,  MD ?  chlorhexidine (PERIDEX) 0.12 % solution, , , ,  ?  famotidine (PEPCID) 20 MG tablet, , , ,  ?  famotidine (PEPCID) tablet 20 mg, 20 mg, Oral, Once, Karen Kitchens, NP ? ? ? ?ALLERGIES  ? ?Patient has no known allergies. ? ? ? ? ?REVIEW OF SYSTEMS  ? ? ?Review of Systems: ? ?Gen:  Denies  fever, sweats, chills weigh loss  ?HEENT: Denies blurred vision, double vision, ear pain, eye pain, hearing loss, nose bleeds, sore throat ?Cardiac:  No dizziness, chest pain or heaviness, chest tightness,edema ?Resp:   reports dyspnea chronically  ?Gi: Denies swallowing difficulty, stomach pain, nausea or vomiting, diarrhea, constipation, bowel incontinence ?Gu:  Denies bladder incontinence, burning urine ?Ext:   Denies Joint pain, stiffness or swelling ?Skin: Denies  skin rash, easy bruising or bleeding or hives ?Endoc:  Denies polyuria, polydipsia , polyphagia or weight change ?Psych:   Denies depression, insomnia or hallucinations  ? ?Other:  All other systems negative ? ? ?VS: BP (!) 186/75   Pulse 89   Resp 20   Ht 5\' 9"  (1.753 m)   Wt 95.3 kg   SpO2 (!) 86% Comment: room air placed on [place] 3L  BMI 31.01 kg/m?   ? ? ? ?PHYSICAL  EXAM  ? ? ?GENERAL:NAD, no fevers, chills, no weakness no fatigue ?HEAD: Normocephalic, atraumatic.  ?EYES: Pupils equal, round, reactive to light. Extraocular muscles intact. No scleral icterus.  ?MOUTH: Moist mucosal membrane. Dentition intact. No abscess noted.  ?EAR, NOSE, THROAT: Clear without exudates. No external lesions.  ?NECK: Supple. No thyromegaly. No nodules. No JVD.  ?PULMONARY: decreased breath sounds with mild rhonchi worse at bases bilaterally.  ?CARDIOVASCULAR: S1 and S2. Regular rate and rhythm. No murmurs, rubs, or gallops. No edema. Pedal pulses 2+ bilaterally.  ?GASTROINTESTINAL: Soft, nontender, nondistended. No masses. Positive bowel sounds. No hepatosplenomegaly.  ?MUSCULOSKELETAL: No swelling, clubbing, or edema. Range of motion full in all extremities.  ?NEUROLOGIC:  Cranial nerves II through XII are intact. No gross focal neurological deficits. Sensation intact. Reflexes intact.  ?SKIN: No ulceration, lesions, rashes, or cyanosis. Skin warm and dry. Turgor intact.  ?PSYCHIATRIC: Mood, affect within normal limits. The patient is awake, alert and oriented x 3. Insight, judgment intact.  ? ? ?  ? ?IMAGING  ? ?@IMAGES @  CT chest reviewed ? ?ASSESSMENT/PLAN  ? ?RUL lung mass with hilar/mediastinal adenopathy ?    -patient here for tissue diagnsis via navigational bronchoscopy and endobronchial ultrasound assisted lymph node biopsies. ?-This case is somewhat complicated because the right upper lobe lesion is abutting the major fissure and is surrounded by severe emphysematous changes which is an increased risk factor for pneumothorax.  This has been explained to the patient. ?-I reviewed case with anesthesiologist Dr. Bertell Maria and he thinks due to severe emphysema patient may require hospitalization after the procedure. ?  -Reviewed risks/complications and benefits with patient, risks include infection, pneumothorax/pneumomediastinum which may require chest tube placement as well as overnight/prolonged hospitalization and possible mechanical ventilation. Other risks include bleeding and very rarely death.  Patient understands risks and wishes to proceed.  Additional questions were answered, and patient is aware that post procedure patient will be going home with family and may experience cough with possible clots on expectoration as well as phlegm which may last few days as well as hoarseness of voice post intubation and mechanical ventilation. ? ? ?  ? ? ? ? ? ? ? ?Thank you for allowing me to participate in the care of this patient.  ? ?Patient/Family are satisfied with care plan and all questions have been answered.  ? ? ?Provider disclosure: ?Patient with at least one acute or chronic illness or injury that poses a threat to life or bodily function and is being managed actively during  this encounter.  All of the below services have been performed independently by signing provider:  review of prior documentation from internal and or external health records.  Review of previous and current lab results.  Interview and comprehensive assessment during patient visit today. Review of current and previous chest radiographs/CT scans. Discussion of management and test interpretation with health care team and patient/family.  ? ?This document was prepared using Dragon voice recognition software and may include unintentional dictation errors. ? ? ?  ?Ottie Glazier, M.D.  ?Division of Pulmonary & Critical Care Medicine  ? ? ? ? ? ? ? ? ?

## 2022-01-23 NOTE — Transfer of Care (Signed)
Immediate Anesthesia Transfer of Care Note ? ?Patient: Louis Sullivan ? ?Procedure(s) Performed: VIDEO BRONCHOSCOPY WITH ENDOBRONCHIAL NAVIGATION ?VIDEO BRONCHOSCOPY WITH ENDOBRONCHIAL ULTRASOUND ? ?Patient Location: PACU ? ?Anesthesia Type:General ? ?Level of Consciousness: awake and alert  ? ?Airway & Oxygen Therapy: Patient Spontanous Breathing and Patient connected to face mask oxygen ? ?Post-op Assessment: Report given to RN and Post -op Vital signs reviewed and stable ? ?Post vital signs: Reviewed and stable ? ?Last Vitals:  ?Vitals Value Taken Time  ?BP 158/86 01/23/22 1445  ?Temp 36.1 ?C 01/23/22 1442  ?Pulse 29 01/23/22 1447  ?Resp 32 01/23/22 1447  ?SpO2 86 % 01/23/22 1447  ?Vitals shown include unvalidated device data. ? ?Last Pain:  ?Vitals:  ? 01/23/22 1053  ?PainSc: 0-No pain  ?   ? ?  ? ?Complications: No notable events documented. ?

## 2022-01-23 NOTE — H&P (Signed)
? ? ? ?PULMONOLOGY ? ? ? ? ? ? ? ? ?Date: 01/23/2022,   ?MRN# [id number] Louis Sullivan 1950/11/26 ? ? ?  ?AdmissionWeight: 95.3 kg                 ?CurrentWeight: 95.3 kg ? ?Referring provider: Dr. Raul Del ? ? ?CHIEF COMPLAINT:  ? ?Lung mass with hilar and mediastinal lymphadenopathy ? ? ?HISTORY OF PRESENT ILLNESS  ? ?Patient has a history of advanced COPD lifelong smoking, hypertension dyslipidemia diabetes recurrent headaches, he had CT chest performed at [facility] as well as repeat CT chest performed Jan 18, 2022 with findings of a thick-walled cavitary lesion approximately 3 cm at the right upper lobe abutting the major fissure highly concerning for primary lung malignancy or metastasis.  There is also left hilar adenopathy.  Patient is here today for navigational bronchoscopy and endobronchial ultrasound assisted lymph node biopsies. ? ? ?PAST MEDICAL HISTORY  ? ?Past Medical History:  ?Diagnosis Date  ? COPD (chronic obstructive pulmonary disease) ([place])   ? Coronary artery disease   ? 1 stent placed in about 2008  ? Diabetes mellitus without complication ([place])   ? Dyspnea   ? Headache   ? migraines  ? Hyperlipidemia   ? Hypertension   ? Pneumonia 12/2021  ? hospitalized at [facility]  ? ? ? ?SURGICAL HISTORY  ? ?Past Surgical History:  ?Procedure Laterality Date  ? CARDIAC CATHETERIZATION    ? COLONOSCOPY    ? ? ? ?FAMILY HISTORY  ? ?History reviewed. No pertinent family history. ? ? ?SOCIAL HISTORY  ? ?Social History  ? ?Tobacco Use  ? Smoking status: Former  ?  Packs/day: 1.00  ?  Years: 20.00  ?  Pack years: 20.00  ?  Types: Cigarettes  ?  Quit date: 01/03/2022  ?  Years since quitting: 0.0  ? Smokeless tobacco: Never  ?Vaping Use  ? Vaping Use: Never used  ?Substance Use Topics  ? Alcohol use: Not Currently  ? Drug use: Never  ? ? ? ?MEDICATIONS  ? ? ?Home Medication:  ?  ?Current Medication: ? ?Current Facility-Administered Medications:  ?  0.9 %  sodium chloride infusion, , Intravenous, Continuous, Darrin Nipper,  MD, New Bag at 01/23/22 1238 ?  butamben-tetracaine-benzocaine (CETACAINE) spray 1 spray, 1 spray, Topical, Once, Ottie Glazier, MD ?  chlorhexidine (PERIDEX) 0.12 % solution, , , ,  ?  famotidine (PEPCID) 20 MG tablet, , , ,  ?  famotidine (PEPCID) tablet 20 mg, 20 mg, Oral, Once, Honor Loh E, NP ?  lidocaine (PF) (XYLOCAINE) 1 % injection 30 mL, 30 mL, Other, Once, Baylin Cabal, MD ?  lidocaine (XYLOCAINE) 2 % jelly 1 application., 1 application., Topical, Once, Ottie Glazier, MD ?  phenylephrine (NEO-SYNEPHRINE) 0.25 % nasal spray 1 spray, 1 spray, Each Nare, Q6H PRN, Ottie Glazier, MD ? ?Facility-Administered Medications Ordered in Other Encounters:  ?  dexamethasone (DECADRON) injection, , Intravenous, Anesthesia Intra-op, Tollie Eth, CRNA, 4 mg at 01/23/22 1308 ?  fentaNYL (SUBLIMAZE) injection, , Intravenous, Anesthesia Intra-op, Tollie Eth, CRNA, 50 mcg at 01/23/22 1255 ?  lidocaine (cardiac) 100 mg/15mL (XYLOCAINE) injection 2%, , Intravenous, Anesthesia Intra-op, Tollie Eth, CRNA, 100 mg at 01/23/22 1253 ?  metoprolol tartrate (LOPRESSOR) injection, , Intravenous, Anesthesia Intra-op, Tollie Eth, CRNA, 2 mg at 01/23/22 1258 ?  ondansetron (ZOFRAN) injection, , Intravenous, Anesthesia Intra-op, Tollie Eth, CRNA, 4 mg at 01/23/22 1341 ?  PHENYLephrine 80 mcg/ml in normal saline  Adult IV Push Syringe (For Blood Pressure Support), , Intravenous, Anesthesia Intra-op, Tollie Eth, CRNA, 200 mcg at 01/23/22 1358 ?  propofol (DIPRIVAN) 10 mg/mL bolus/IV push, , Intravenous, Anesthesia Intra-op, Tollie Eth, CRNA, 150 mg at 01/23/22 1253 ?  rocuronium (ZEMURON) injection, , Intravenous, Anesthesia Intra-op, Tollie Eth, CRNA, 60 mg at 01/23/22 1253 ?  sugammadex sodium (BRIDION) injection, , Intravenous, Anesthesia Intra-op, Tollie Eth, CRNA, 200 mg at 01/23/22 1426 ? ? ? ?ALLERGIES  ? ?Patient has no known allergies. ? ? ? ? ?REVIEW OF SYSTEMS   ? ? ?Review of Systems: ? ?Gen:  Denies  fever, sweats, chills weigh loss  ?HEENT: Denies blurred vision, double vision, ear pain, eye pain, hearing loss, nose bleeds, sore throat ?Cardiac:  No dizziness, chest pain or heaviness, chest tightness,edema ?Resp:   reports dyspnea chronically  ?Gi: Denies swallowing difficulty, stomach pain, nausea or vomiting, diarrhea, constipation, bowel incontinence ?Gu:  Denies bladder incontinence, burning urine ?Ext:   Denies Joint pain, stiffness or swelling ?Skin: Denies  skin rash, easy bruising or bleeding or hives ?Endoc:  Denies polyuria, polydipsia , polyphagia or weight change ?Psych:   Denies depression, insomnia or hallucinations  ? ?Other:  All other systems negative ? ? ?VS: BP (!) 186/75   Pulse 89   Resp 20   Ht 5\' 9"  (1.753 m)   Wt 95.3 kg   SpO2 (!) 86% Comment: room air placed on [place] 3L  BMI 31.01 kg/m?   ? ? ? ?PHYSICAL EXAM  ? ? ?GENERAL:NAD, no fevers, chills, no weakness no fatigue ?HEAD: Normocephalic, atraumatic.  ?EYES: Pupils equal, round, reactive to light. Extraocular muscles intact. No scleral icterus.  ?MOUTH: Moist mucosal membrane. Dentition intact. No abscess noted.  ?EAR, NOSE, THROAT: Clear without exudates. No external lesions.  ?NECK: Supple. No thyromegaly. No nodules. No JVD.  ?PULMONARY: decreased breath sounds with mild rhonchi worse at bases bilaterally.  ?CARDIOVASCULAR: S1 and S2. Regular rate and rhythm. No murmurs, rubs, or gallops. No edema. Pedal pulses 2+ bilaterally.  ?GASTROINTESTINAL: Soft, nontender, nondistended. No masses. Positive bowel sounds. No hepatosplenomegaly.  ?MUSCULOSKELETAL: No swelling, clubbing, or edema. Range of motion full in all extremities.  ?NEUROLOGIC: Cranial nerves II through XII are intact. No gross focal neurological deficits. Sensation intact. Reflexes intact.  ?SKIN: No ulceration, lesions, rashes, or cyanosis. Skin warm and dry. Turgor intact.  ?PSYCHIATRIC: Mood, affect within normal limits.  The patient is awake, alert and oriented x 3. Insight, judgment intact.  ? ? ?  ? ?IMAGING  ? ?@IMAGES @  CT chest reviewed ? ?ASSESSMENT/PLAN  ? ?RUL lung mass with hilar/mediastinal adenopathy ?    -patient here for tissue diagnsis via navigational bronchoscopy and endobronchial ultrasound assisted lymph node biopsies. ?-This case is somewhat complicated because the right upper lobe lesion is abutting the major fissure and is surrounded by severe emphysematous changes which is an increased risk factor for pneumothorax.  This has been explained to the patient. ?-I reviewed case with anesthesiologist Dr. Bertell Maria and he thinks due to severe emphysema patient may require hospitalization after the procedure. ?  -Reviewed risks/complications and benefits with patient, risks include infection, pneumothorax/pneumomediastinum which may require chest tube placement as well as overnight/prolonged hospitalization and possible mechanical ventilation. Other risks include bleeding and very rarely death.  Patient understands risks and wishes to proceed.  Additional questions were answered, and patient is aware that post procedure patient will be going home with family and may experience cough with  possible clots on expectoration as well as phlegm which may last few days as well as hoarseness of voice post intubation and mechanical ventilation. ? ? ?  ? ? ? ? ? ? ? ?Thank you for allowing me to participate in the care of this patient.  ? ?Patient/Family are satisfied with care plan and all questions have been answered.  ? ? ?Provider disclosure: ?Patient with at least one acute or chronic illness or injury that poses a threat to life or bodily function and is being managed actively during this encounter.  All of the below services have been performed independently by signing provider:  review of prior documentation from internal and or external health records.  Review of previous and current lab results.  Interview and comprehensive  assessment during patient visit today. Review of current and previous chest radiographs/CT scans. Discussion of management and test interpretation with health care team and patient/family.  ? ?This document was

## 2022-01-23 NOTE — Anesthesia Preprocedure Evaluation (Signed)
Anesthesia Evaluation  ?Patient identified by MRN, date of birth, ID band ?Patient awake ? ? ? ?Reviewed: ?Allergy & Precautions, NPO status , Patient's Chart, lab work & pertinent test results ? ?History of Anesthesia Complications ?Negative for: history of anesthetic complications ? ?Airway ?Mallampati: II ? ?TM Distance: >3 FB ?Neck ROM: Full ? ? ? Dental ? ?(+) Poor Dentition, Missing, Loose, Chipped ?  ?Pulmonary ?shortness of breath and at rest, neg sleep apnea, COPD,  COPD inhaler and oxygen dependent, Patient abstained from smoking.Not current smoker, former smoker,  ?2L/min [place] at night ?  ? ?+ decreased breath sounds ? ? ? ? ? Cardiovascular ?Exercise Tolerance: Good ?METShypertension, Pt. on medications ?+ CAD  ?(-) Past MI (-) dysrhythmias  ?Rhythm:Regular Rate:Normal ?- Systolic murmurs ? ?  ?Neuro/Psych ? Headaches, negative psych ROS  ? GI/Hepatic ?neg GERD  ,(+)  ?  ? (-) substance abuse ? ,   ?Endo/Other  ?diabetes ? Renal/GU ?negative Renal ROS  ? ?  ?Musculoskeletal ? ? Abdominal ?  ?Peds ? Hematology ?  ?Anesthesia Other Findings ?Past Medical History: ?No date: COPD (chronic obstructive pulmonary disease) ([place]) ?No date: Coronary artery disease ?    Comment:  1 stent placed in about 2008 ?No date: Diabetes mellitus without complication ([place]) ?No date: Dyspnea ?No date: Headache ?    Comment:  migraines ?No date: Hyperlipidemia ?No date: Hypertension ?12/2021: Pneumonia ?    Comment:  hospitalized at [facility] ? Reproductive/Obstetrics ? ?  ? ? ? ? ? ? ? ? ? ? ? ? ? ?  ?  ? ? ? ? ? ? ? ? ?Anesthesia Physical ?Anesthesia Plan ? ?ASA: 4 ? ?Anesthesia Plan: General  ? ?Post-op Pain Management: Ofirmev IV (intra-op)*  ? ?Induction: Intravenous ? ?PONV Risk Score and Plan: 2 and Ondansetron and Dexamethasone ? ?Airway Management Planned: Oral ETT and Video Laryngoscope Planned ? ?Additional Equipment: None ? ?Intra-op Plan:  ? ?Post-operative Plan: Extubation in OR and  Possible Post-op intubation/ventilation ? ?Informed Consent: I have reviewed the patients History and Physical, chart, labs and discussed the procedure including the risks, benefits and alternatives for the proposed anesthesia with the patient or authorized representative who has indicated his/her understanding and acceptance.  ? ? ? ?Dental advisory given ? ?Plan Discussed with: CRNA and Surgeon ? ?Anesthesia Plan Comments: (Discussed risks of anesthesia with patient, including PONV, sore throat, lip/dental/eye damage. Rare risks discussed as well, such as cardiorespiratory and neurological sequelae, and allergic reactions. Discussed the role of CRNA in patient's perioperative care. Patient understands. ?Patient counseled on being higher risk for anesthesia due to comorbidities: O2-dependent severe COPD. Patient was told about increased risk of cardiac and respiratory events, including death. ?)  ? ? ? ? ? ? ?Anesthesia Quick Evaluation ? ?

## 2022-01-23 NOTE — Discharge Instructions (Addendum)

## 2022-01-23 NOTE — Procedures (Signed)
ELECTROMAGNETIC NAVIGATIONAL BRONCHOSCOPY PROCEDURE NOTE ? ?FIBEROPTIC BRONCHOSCOPY WITH BRONCHOALVEOLAR LAVAGE AND THERAPEUTIC ASPIRATION OF TRACHEOBRONCHIAL TREE PROCEDURE NOTE ? ?ENDOBRONCHIAL ULTRASOUND PROCEDURE NOTE ? ? ? ?Flexible bronchoscopy was performed  by : Lanney Gins MD ? ?assistance by : 1)Repiratory therapist  and 2)[employer] [facility] staff and 3) Anesthesia team and 4) Flouroscopy team and 5) body vision supporting staff ? ? ?Indication for the procedure was : ? ?Pre-procedural H&P. ?The following assessment was performed on the day of the procedure prior to initiating sedation ?History:  ?Chest pain n ?Dyspnea y ?Hemoptysis n ?Cough y ?Fever n ?Other pertinent items n ? ?Examination ?Vital signs -reviewed as per nursing documentation today ?Cardiac    Murmurs: n ? Rubs : n ? Gallop: n ?Lungs Wheezing: n ?Rales : n ?Rhonchi :y ? ?Other pertinent findings: SOB/hypoxemia due to chronic lung disease  ? ?Pre-procedural assessment for Procedural Sedation included: ?Depth of sedation: As per anesthesia team  ?ASA Classification:  2 ?Mallampati airway assessment: 3 ? ? ? ?Medication list reviewed: y ? ?The patient?s interval history was taken and revealed: no new complaints ?The pre- procedure physical examination revealed: No new findings ?Refer to prior clinic note for details. ? ?Informed Consent: ?Informed consent was obtained from:  patient after explanation of procedure and risks, benefits, as well as alternative procedures available.  Explanation of level of sedation and possible transfusion was also provided.  ?  ?Procedural Preparation: ?Time out was performed and patient was identified by name and birthdate and procedure to be performed and side for sampling, if any, was specified. ?Pt was intubated by anesthesia. ? ?The patient was appropriately draped. ? ? ?Fiberoptic bronchoscopy with airway inspection and BAL Procedure findings: ? ?Bronchoscope was inserted via ETT  without difficulty.   Posterior oropharynx, epiglottis, arytenoids, false cords and vocal cords were not visualized as these were bypassed by endotracheal tube. The distal trachea was normal in circumference and appearance without mucosal, cartilaginous or branching abnormalities.  The main carina was mildly splayed . ?All right and left lobar airways were visualized to the Subsegmental level.  Sub- sub segmental carinae were identified in all the distal airways.   ?Secretions were visible in the following airways and appeared to be clear.  ?The mucosa was : friable at RUL ? ?Airways were notable for:  ?      exophytic lesions :n ?      extrinsic compression in the following distributions: n. ?      Friable mucosa: y ?      Anthrocotic material /pigmentation: n ?  ? ?Initial airway inspection showed mucus plugging worse in the right middle lobe and on top of tumor over the left mainstem bronchi these were aspirated bilaterally ? 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 ?Post procedure Diagnosis:   Lung cancer ? ? ? ? ?Electromagnetic Navigational Bronchoscopy Procedure Findings:  ?After appropriate CT-guided planning scope was advanced via endotracheal tube and scope was advanced for registration.  Post appropriate planning and registration peripheral navigation was used to visualize target lesion.   ? ?Right upper lobe lesion was targeted and initially Cytobrush was performed x1 which showed lesional cells reported by [employer].  Next FNA Wang biopsy of right upper lobe was performed with cytology in process.  Next surgical biopsy x4 was performed right upper lobe lesion with lesional cancerous cells  reported by [employer]. ? ?Post procedure diagnosis: Lung cancer ? ? ? ? ? ?Endobronchial ultrasound assisted hilar and mediastinal lymph node biopsies procedure findings: ?The fiberoptic  bronchoscope was removed and the EBUS scope was introduced. Examination began to evaluate for pathologically enlarged lymph nodes starting on the right side progressing to the left side.  All lymph node biopsies performed with 21-gauge needle. Lymph node biopsies were sent in cytolite for all stations. ? ? ?Station 10 R was 1.3 cm this was biopsied 4 times ?Station 7 was 1.8 cm this was biopsied 4 times ?Station 10 L was 1.4 cm this was biopsied 4 times ? ?Post procedure diagnosis: Lung cancer ? ? ?Specimens obtained included: ? ?                   ?Cytology brushes : Right upper lobe lesion ? ?Broncho-alveolar lavage site: Right upper lobe sent for cytology                            ? ?40 ml volume infused ?15 ml volume returned with cellular appearance ? ? ?Fluoroscopy Used: Yes;        Pictorial documentation attached: None ?               ? ? ?Immediate sampling complications included: None ?Epinephrine 0 ml was used topically ? ?The bronchoscopy was terminated due to completion of the planned procedure and the bronchoscope was removed.  ? ?Total dosage of Lidocaine was 0 mg ? ? ?Estimated Blood loss: Less than 15 cc expected cc. ? ?Complications included: ?None immediate ? ?Preliminary CXR findings :  ?In process ? ?Disposition: Home with family ? ?Follow up with Dr. Lanney Gins in 5 days for result discussion. ? ? ? ? ?Ottie Glazier MD  ?[place] ?Division of Pulmonary & Critical Care Medicine ? ? ?

## 2022-01-24 ENCOUNTER — Encounter: Payer: Self-pay | Admitting: Pulmonary Disease

## 2022-01-24 NOTE — Anesthesia Postprocedure Evaluation (Signed)
Anesthesia Post Note ? ?Patient: Louis Sullivan ? ?Procedure(s) Performed: VIDEO BRONCHOSCOPY WITH ENDOBRONCHIAL NAVIGATION ?VIDEO BRONCHOSCOPY WITH ENDOBRONCHIAL ULTRASOUND ? ?Patient location during evaluation: PACU ?Anesthesia Type: General ?Level of consciousness: awake and alert ?Pain management: pain level controlled ?Vital Signs Assessment: post-procedure vital signs reviewed and stable ?Respiratory status: spontaneous breathing, nonlabored ventilation, respiratory function stable and patient connected to nasal cannula oxygen ?Cardiovascular status: blood pressure returned to baseline and stable ?Postop Assessment: no apparent nausea or vomiting ?Anesthetic complications: no ? ? ?No notable events documented. ? ? ?Last Vitals:  ?Vitals:  ? 01/23/22 1630 01/23/22 1648  ?BP:    ?Pulse: 78 (P) 93  ?Resp:  (P) 20  ?Temp: (!) 36.1 ?C (!) (P) 36.3 ?C  ?SpO2: 90% (P) 90%  ?  ?Last Pain:  ?Vitals:  ? 01/23/22 1648  ?TempSrc: (P) Temporal  ?PainSc: (P) 0-No pain  ? ? ?  ?  ?  ?  ?  ?  ? ?[name] ? ? ? ? ?

## 2022-01-25 LAB — CYTOLOGY - NON PAP

## 2022-01-25 LAB — SURGICAL PATHOLOGY

## 2022-01-29 ENCOUNTER — Encounter: Payer: Self-pay | Admitting: *Deleted

## 2022-01-29 NOTE — Progress Notes (Signed)
Phone call made to patient to introduce to navigator services and review upcoming appt. All questions answered during visit. Pt given upcoming appt information. Contact information and encouraged to call with any questions or needs prior to appt. Pt verbalized understanding and confirmed appt. ?

## 2022-01-30 LAB — CYTOLOGY - NON PAP

## 2022-01-31 ENCOUNTER — Other Ambulatory Visit: Payer: Self-pay | Admitting: Anatomic Pathology & Clinical Pathology

## 2022-01-31 NOTE — Progress Notes (Signed)
Mdt

## 2022-02-01 ENCOUNTER — Inpatient Hospital Stay: Payer: Medicare Other | Attending: Internal Medicine | Admitting: Internal Medicine

## 2022-02-01 ENCOUNTER — Encounter: Payer: Self-pay | Admitting: Internal Medicine

## 2022-02-01 ENCOUNTER — Encounter: Payer: Self-pay | Admitting: *Deleted

## 2022-02-01 ENCOUNTER — Inpatient Hospital Stay: Payer: Medicare Other

## 2022-02-01 ENCOUNTER — Ambulatory Visit: Payer: Medicare Other | Admitting: Internal Medicine

## 2022-02-01 VITALS — BP 172/88 | HR 39 | Temp 97.6°F | Ht 69.0 in | Wt 204.4 lb

## 2022-02-01 DIAGNOSIS — C3411 Malignant neoplasm of upper lobe, right bronchus or lung: Secondary | ICD-10-CM | POA: Insufficient documentation

## 2022-02-01 DIAGNOSIS — J439 Emphysema, unspecified: Secondary | ICD-10-CM | POA: Diagnosis not present

## 2022-02-01 DIAGNOSIS — I2510 Atherosclerotic heart disease of native coronary artery without angina pectoris: Secondary | ICD-10-CM | POA: Diagnosis not present

## 2022-02-01 DIAGNOSIS — R5383 Other fatigue: Secondary | ICD-10-CM

## 2022-02-01 DIAGNOSIS — C3402 Malignant neoplasm of left main bronchus: Secondary | ICD-10-CM | POA: Diagnosis present

## 2022-02-01 DIAGNOSIS — E119 Type 2 diabetes mellitus without complications: Secondary | ICD-10-CM | POA: Diagnosis not present

## 2022-02-01 DIAGNOSIS — Z87891 Personal history of nicotine dependence: Secondary | ICD-10-CM | POA: Diagnosis not present

## 2022-02-01 LAB — CBC WITH DIFFERENTIAL/PLATELET
Abs Immature Granulocytes: 0.02 10*3/uL (ref 0.00–0.07)
Basophils Absolute: 0 10*3/uL (ref 0.0–0.1)
Basophils Relative: 1 %
Eosinophils Absolute: 0.3 10*3/uL (ref 0.0–0.5)
Eosinophils Relative: 6 %
HCT: 50.6 % (ref 39.0–52.0)
Hemoglobin: 15.7 g/dL (ref 13.0–17.0)
Immature Granulocytes: 0 %
Lymphocytes Relative: 33 %
Lymphs Abs: 1.6 10*3/uL (ref 0.7–4.0)
MCH: 29.3 pg (ref 26.0–34.0)
MCHC: 31 g/dL (ref 30.0–36.0)
MCV: 94.4 fL (ref 80.0–100.0)
Monocytes Absolute: 0.6 10*3/uL (ref 0.1–1.0)
Monocytes Relative: 12 %
Neutro Abs: 2.4 10*3/uL (ref 1.7–7.7)
Neutrophils Relative %: 48 %
Platelets: 234 10*3/uL (ref 150–400)
RBC: 5.36 MIL/uL (ref 4.22–5.81)
RDW: 13.3 % (ref 11.5–15.5)
WBC: 5.1 10*3/uL (ref 4.0–10.5)
nRBC: 0 % (ref 0.0–0.2)

## 2022-02-01 LAB — COMPREHENSIVE METABOLIC PANEL
ALT: 13 U/L (ref 0–44)
AST: 15 U/L (ref 15–41)
Albumin: 4 g/dL (ref 3.5–5.0)
Alkaline Phosphatase: 89 U/L (ref 38–126)
Anion gap: 7 (ref 5–15)
BUN: 9 mg/dL (ref 8–23)
CO2: 30 mmol/L (ref 22–32)
Calcium: 9 mg/dL (ref 8.9–10.3)
Chloride: 100 mmol/L (ref 98–111)
Creatinine, Ser: 0.85 mg/dL (ref 0.61–1.24)
GFR, Estimated: >60 mL/min (ref >60)
Glucose, Bld: 107 mg/dL — ABNORMAL HIGH (ref 70–99)
Potassium: 4.2 mmol/L (ref 3.5–5.1)
Sodium: 137 mmol/L (ref 135–145)
Total Bilirubin: 0.6 mg/dL (ref 0.3–1.2)
Total Protein: 7.7 g/dL (ref 6.5–8.1)

## 2022-02-01 LAB — TSH: TSH: 1.091 u[IU]/mL (ref 0.350–4.500)

## 2022-02-01 LAB — LACTATE DEHYDROGENASE: LDH: 148 U/L (ref 98–192)

## 2022-02-01 NOTE — Progress Notes (Signed)
Order for [facility] faxed to pathology to send out.

## 2022-02-01 NOTE — Progress Notes (Signed)
[facility] NOTE  Patient Care Team: [place] as PCP - General Telford Nab, RN as Oncology Nurse Navigator Cammie Sickle, MD as Consulting Physician (Oncology)  CHIEF COMPLAINTS/PURPOSE OF CONSULTATION: lung cancer  #  Oncology History Overview Note  IMPRESSION: 1. Thick-walled cavitary lesion of the posterior right upper lobe abutting the major fissure measuring 3.3 x 3.3 cm, highly concerning for primary lung malignancy or metastasis. 2. Although evaluation is limited by lack of intravenous contrast, suspect significant left hilar lymphadenopathy or mass, with apparent obstruction of the left upper lobe bronchus and severe narrowing of the left lower lobe bronchus. This finding is likewise highly concerning for primary lung malignancy or alternately nodal metastasis. Contrast enhanced CT may be helpful to more clearly evaluate the hilum. 3. There may be additional right hilar lymphadenopathy, again assessment limited by noncontrast CT. 4. Scattered heterogeneous and ground-glass airspace opacity throughout the upper lobes and right lower lobe, possibly reflecting postobstructive airspace disease in the left lung generally nonspecific. 5. Emphysema. 6. Coronary artery disease.   Aortic Atherosclerosis (ICD10-I70.0) and Emphysema (ICD10-J43.9).     Electronically Signed   By: Delanna Ahmadi M.D.   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  male history of smoking is here  for further evaluation and recommendations for newly diagnosed lung cancer.  Patient states that he had pneumonia 4 weeks ago. S/p antibiotics.  However the findings on the chest x-ray did not resolve.  Which led to CT scan-which unfortunately showed a cavitary mass in the right upper lung.  Patient underwent evaluation with pulmonary s/p biopsy.  Patient has chronic mild shortness of breath.  Chronic mild cough not any worse.  Denies any headaches or vision changes.  Review of Systems  Constitutional:  Negative for chills, diaphoresis, fever, malaise/fatigue and weight loss.  HENT:  Negative for nosebleeds and sore throat.   Eyes:  Negative for double vision.  Respiratory:  Positive for cough and shortness of breath. Negative for hemoptysis, sputum production and wheezing.   Cardiovascular:  Negative for chest pain, palpitations, orthopnea and leg swelling.  Gastrointestinal:  Negative for abdominal pain, blood in stool, constipation, diarrhea, heartburn, melena, nausea and vomiting.  Genitourinary:  Negative for dysuria, frequency and urgency.  Musculoskeletal:  Positive for joint pain. Negative for back pain.  Skin: Negative.  Negative for itching and rash.  Neurological:  Negative for dizziness, tingling, focal weakness, weakness and headaches.  Endo/Heme/Allergies:  Does not bruise/bleed easily.  Psychiatric/Behavioral:  Negative for depression. The patient is not nervous/anxious and does not have insomnia.     MEDICAL HISTORY:  Past Medical History:  Diagnosis Date   COPD (chronic obstructive pulmonary disease) ([place])    Coronary artery disease    1 stent placed in about 2008   Diabetes mellitus without complication ([place])    Dyspnea    Headache    migraines   Hyperlipidemia    Hypertension    Pneumonia 12/2021   hospitalized  at [facility]: Past Surgical History:  Procedure Laterality Date   CARDIAC CATHETERIZATION     COLONOSCOPY     VIDEO BRONCHOSCOPY WITH  ENDOBRONCHIAL NAVIGATION N/A 01/23/2022   Procedure: VIDEO BRONCHOSCOPY WITH ENDOBRONCHIAL NAVIGATION;  Surgeon: Ottie Glazier, MD;  Location: [facility] ORS;  Service: Thoracic;  Laterality: N/A;   VIDEO BRONCHOSCOPY WITH ENDOBRONCHIAL ULTRASOUND N/A 01/23/2022   Procedure: VIDEO BRONCHOSCOPY WITH ENDOBRONCHIAL ULTRASOUND;  Surgeon: Ottie Glazier, MD;  Location: [facility] OR[facility];  Service: Thoracic;  Laterality: N/A;    SOCIAL HISTORY: Social History   Socioeconomic History   Marital status: Widowed    Spouse name: Not on file   Number of children: Not on file   Years of education: Not on file   Highest education level: Not on file  Occupational History   Not on file  Tobacco Use   Smoking status: Former    Packs/day: 1.00    Years: 20.00    Pack years: 20.00    Types: Cigarettes    Quit date: 01/03/2022    Years since quitting: 0.0   Smokeless tobacco: Never  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 Lives in [place]. Son passed in 2021;MI wife passed away in 1990s/aneurysm; daughter in [place].    Social Determinants of Health   Financial Resource Strain: Not on file  Food Insecurity: Not on file  Transportation Needs: Not on file  Physical Activity: Not on file  Stress: Not on file  Social Connections: Not on file  Intimate Partner Violence: Not on file    FAMILY HISTORY: Family History  Problem Relation Age of Onset   Throat cancer Brother     ALLERGIES:  has No Known Allergies.  MEDICATIONS:  Current Outpatient Medications  Medication Sig Dispense Refill   albuterol (PROVENTIL) (5 MG/ML) 0.5% nebulizer solution Take 2.5 mg by nebulization every morning.     albuterol (VENTOLIN HFA) 108 (90 Base) MCG/ACT inhaler Inhale 2 puffs into the lungs every  4 (four) hours as needed for wheezing or shortness of breath.     aspirin EC 81 MG tablet Take 81 mg by mouth daily.     atorvastatin (LIPITOR) 40 MG tablet Take 40 mg by mouth every morning.     Cholecalciferol (VITAMIN D3 PO) Take 1 tablet by mouth daily at 6 (six) AM.     losartan (COZAAR) 100 MG tablet Take 100 mg by mouth every evening.     losartan (COZAAR) 50 MG tablet Take 50 mg by mouth in the morning.     metFORMIN (GLUCOPHAGE) 1000 MG tablet Take 1,000 mg by mouth daily with breakfast.     OXYGEN Inhale 2 L into the lungs at bedtime.     traZODone (DESYREL) 50 MG tablet Take 50 mg by mouth at bedtime.     No current facility-administered medications for this visit.      Marland Kitchen  PHYSICAL EXAMINATION: ECOG PERFORMANCE STATUS: 1 - Symptomatic but completely ambulatory  Vitals:   02/01/22 0919  BP: (!) 172/88  Pulse: (!) 39  Temp: 97.6 F (36.4 C)  SpO2: 96%   Filed Weights   02/01/22 0919  Weight: 204 lb 6.4 oz (92.7 kg)    Physical Exam  Vitals and nursing note reviewed.  HENT:     Head: Normocephalic and atraumatic.     Mouth/Throat:     Pharynx: Oropharynx is clear.  Eyes:     Extraocular Movements: Extraocular movements intact.     Pupils: Pupils are equal, round, and reactive to light.  Cardiovascular:     Rate and Rhythm: Normal rate and regular rhythm.  Pulmonary:     Comments: Decreased breath sounds bilaterally.  Abdominal:     Palpations: Abdomen is soft.  Musculoskeletal:        General: Normal range of motion.     Cervical back: Normal range of motion.  Skin:    General: Skin is warm.  Neurological:     General: No focal deficit present.     Mental Status: He is alert and oriented to person, place, and time.  Psychiatric:        Behavior: Behavior normal.        Judgment: Judgment normal.     LABORATORY DATA:  I have reviewed the data as listed Lab Results  Component Value Date   WBC 5.1 02/01/2022   HGB 15.7 02/01/2022   HCT 50.6  02/01/2022   MCV 94.4 02/01/2022   PLT 234 02/01/2022   Recent Labs    11/22/21 1702 02/01/22 1011  NA 136 137  K 3.8 4.2  CL 99 100  CO2 26 30  GLUCOSE 111* 107*  BUN 13 9  CREATININE 0.90 0.85  CALCIUM 8.9 9.0  GFRNONAA >60 >60  PROT  --  7.7  ALBUMIN  --  4.0  AST  --  15  ALT  --  13  ALKPHOS  --  89  BILITOT  --  0.6    RADIOGRAPHIC STUDIES: I have personally reviewed the radiological images as listed and agreed with the findings in the report. X-ray chest PA or AP  Result Date: 01/23/2022 CLINICAL DATA:  Status post bronchoscopy and biopsy. EXAM: CHEST  1 VIEW COMPARISON:  CT chest from 2 days ago. Chest x-ray dated November 22, 2021. FINDINGS: The heart size and mediastinal contours are within normal limits. Normal pulmonary vascularity. Small ill-defined nodular opacities at both lung bases and in the left parahilar upper lobe are not significantly changed. Grossly unchanged cavitary mass in the right upper lobe. No pleural effusion or pneumothorax. No acute osseous abnormality. IMPRESSION: 1. No pneumothorax status post bronchoscopy. 2. Unchanged cavitary mass in the right upper lobe. 3. Unchanged ill-defined nodular opacities at both lung bases and in the left perihilar upper lobe. Electronically Signed   By: Titus Dubin M.D.   On: 01/23/2022 15:40   DG C-Arm 1-60 Min-No Report  Result Date: 01/23/2022 Fluoroscopy was utilized by the requesting physician.  No radiographic interpretation.   CT Super D Chest Wo Contrast  Result Date: 01/21/2022 CLINICAL DATA:  Navigational biopsy planning, cavitary lung lesion, emphysema EXAM: CT CHEST WITHOUT CONTRAST TECHNIQUE: Multidetector CT imaging of the chest was performed using thin slice collimation for electromagnetic bronchoscopy planning purposes, without intravenous contrast. RADIATION DOSE REDUCTION: This exam was performed according to the departmental dose-optimization program which includes automated exposure control,  adjustment of the mA and/or kV according to patient size and/or use of iterative reconstruction technique. COMPARISON:  None Available. FINDINGS: Cardiovascular: Aortic atherosclerosis. Three-vessel coronary artery calcifications. Normal heart size. No pericardial effusion. Mediastinum/Nodes: Although evaluation is limited by lack of intravenous contrast, suspect significant left hilar lymphadenopathy or mass, with apparent obstruction of the left upper  lobe bronchus and severe narrowing of the left lower lobe bronchus (series 2, image 30). There may be additional right hilar lymphadenopathy, again assessment limited (series 2, image 30). Thyroid gland, trachea, and esophagus demonstrate no significant findings. Lungs/Pleura: Moderate to severe centrilobular emphysema. Thick-walled cavitary lesion of the posterior right upper lobe abutting the major fissure measuring 3.3 x 3.3 cm (series 3, image 14). Scattered heterogeneous and ground-glass airspace opacity throughout the upper lobes and right lower lobe (series 3, image 35, 46). No pleural effusion or pneumothorax. Upper Abdomen: No acute abnormality. Musculoskeletal: No chest wall abnormality. No suspicious osseous lesions identified. IMPRESSION: 1. Thick-walled cavitary lesion of the posterior right upper lobe abutting the major fissure measuring 3.3 x 3.3 cm, highly concerning for primary lung malignancy or metastasis. 2. Although evaluation is limited by lack of intravenous contrast, suspect significant left hilar lymphadenopathy or mass, with apparent obstruction of the left upper lobe bronchus and severe narrowing of the left lower lobe bronchus. This finding is likewise highly concerning for primary lung malignancy or alternately nodal metastasis. Contrast enhanced CT may be helpful to more clearly evaluate the hilum. 3. There may be additional right hilar lymphadenopathy, again assessment limited by noncontrast CT. 4. Scattered heterogeneous and  ground-glass airspace opacity throughout the upper lobes and right lower lobe, possibly reflecting postobstructive airspace disease in the left lung generally nonspecific. 5. Emphysema. 6. Coronary artery disease. Aortic Atherosclerosis (ICD10-I70.0) and Emphysema (ICD10-J43.9). Electronically Signed   By: Delanna Ahmadi M.D.   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  Noncontrast CT scan May 2023- Thick-walled cavitary lesion of the posterior right upper lobe abutting the major fissure measuring 3.3 x 3.3 cm; suspect significant left hilar lymphadenopathy or mass, with apparent obstruction of the left upper lobe bronchus and severe narrowing of the left lower lobe bronchus.  Possible right hilar lymphadenopathy, again assessment limited by noncontrast CT. awaiting a PET scan on May 25.;  Also awaiting discussion at tumor conference on May 25. Will order NGS-F-ONE.     # Clinically suspicious of stage III lung cancer unresectable.  Pending PET scan.  Recommend MRI brain to rule out any metastatic disease to the brain  #Discussed that patient will most likely need chemotherapy-weekly CarboTaxol along with radiation 6 to 8 weeks.  After the chemoradiation patient will be recommended immunotherapy for total of 1 year.   # Chemotherapy education; port placement. Will order at next visit Antiemetics-Zofran and Compazine; EMLA cream.   # Clinical trials: will screen for clinical trials-  [Durvalumab Plus Oleclumab and Durvalumab Plus Monalizumab status post chemoradiation-. Informed clinical trial RN   # Smoking: Active smoker; states her quit smoking the night prior.   # COPD: Stable; continue inhalers as per pulmonary.  #Diabetes-monitor closely on chemotherapy.  # #Incidental findings on Imaging MAY , 2023: Emphysema; coronary artery disease; postobstructive disease I  reviewed/discussed/counseled the patient.   Thank you Drs.Aleskerov/Fleming for allowing me to participate in the care of your pleasant patient. Please do not hesitate to contact me with questions or concerns in the interim.  Discussed with nurse navigator.  We will reach out to the daughter with an update.   # DISPOSITION: # labs today- cbc/cmp/LDH;TSH; CEA # Chemo  Education ASAP- carbo-taxol with RT # MRI brain ASAP # port referral to IR # Refer to Dr.Chrystal re: lung cancer # follow  up on 5/26 8:30- Dr.B       All questions were answered. The patient knows to call the clinic with any problems, questions or concerns.       Cammie Sickle, MD 02/01/2022 12:08 PM

## 2022-02-01 NOTE — Progress Notes (Signed)
Met with patient during initial consult with Dr. Rogue Bussing to discuss diagnosis and treatment options. All questions answered during visit. Reviewed upcoming appts with Louis Sullivan and informed that he will be called with his appt for port placement once scheduled. Contact info given and instructed to call with any questions or needs. Louis Sullivan verbalized understanding.

## 2022-02-01 NOTE — Assessment & Plan Note (Addendum)
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  Noncontrast CT scan May 2023- Thick-walled cavitary lesion of the posterior right upper lobe abutting the major fissure measuring 3.3 x 3.3 cm; suspect significant left hilar lymphadenopathy or mass, with apparent obstruction of the left upper lobe bronchus and severe narrowing of the left lower lobe bronchus.  Possible right hilar lymphadenopathy, again assessment limited by noncontrast CT. awaiting a PET scan on May 25.;  Also awaiting discussion at tumor conference on May 25. Will order NGS-F-ONE.   # Clinically suspicious of stage III lung cancer unresectable.  Pending PET scan.  Recommend MRI brain to rule out any metastatic disease to the brain  #Discussed that patient will most likely need chemotherapy-weekly CarboTaxol along with radiation 6 to 8 weeks.  After the chemoradiation patient will be recommended immunotherapy for total of 1 year.   # Chemotherapy education; port placement. Will order at next visit Antiemetics-Zofran and Compazine; EMLA cream.   # Clinical trials: will screen for clinical trials-  [Durvalumab Plus Oleclumab and Durvalumab Plus Monalizumab status post chemoradiation-. Informed clinical trial RN   # Smoking: Active smoker; states her quit smoking the night prior.   # COPD: Stable; continue inhalers as per pulmonary.  #Diabetes-monitor closely on chemotherapy.  # #Incidental findings on Imaging MAY , 2023: Emphysema; coronary artery disease; postobstructive disease I reviewed/discussed/counseled the patient.   Thank you Drs.Aleskerov/Fleming for allowing me to participate in the care of your pleasant patient. Please do not hesitate to contact me with questions or concerns in the interim.  Discussed with nurse navigator.  We will reach out to the daughter with an update.   # DISPOSITION: # labs today- cbc/cmp/LDH;TSH; CEA # Chemo  Education ASAP- carbo-taxol with RT # MRI brain ASAP # port  referral to IR # Refer to Dr.Chrystal re: lung cancer # follow up on 5/26 8:30- Dr.B

## 2022-02-02 LAB — CEA: CEA: 3.6 ng/mL (ref 0.0–4.7)

## 2022-02-03 ENCOUNTER — Telehealth: Payer: Self-pay | Admitting: Internal Medicine

## 2022-02-03 NOTE — Telephone Encounter (Signed)
I spoke to pt's daughter-who is concerned about patient's ongoing care for lung cancer locally as patient lives alone.  She is interested in having the patient moved to [place] the patient's daughter lives.  She will speak to her father over the weekend regarding his decision.  Hayley please follow-up with the patient's daughter/and the patient; and if they are interested-please make a referral to oncology in [facility].  However please asked the patient to keep his appointments with [place] as planned. Thanks, GB

## 2022-02-04 ENCOUNTER — Ambulatory Visit
Admission: RE | Admit: 2022-02-04 | Discharge: 2022-02-04 | Disposition: A | Discharge status: Home / Self Care | Payer: Medicare Other | Source: Ambulatory Visit | Attending: Internal Medicine | Admitting: Internal Medicine

## 2022-02-04 ENCOUNTER — Inpatient Hospital Stay: Payer: Medicare Other

## 2022-02-04 DIAGNOSIS — C3411 Malignant neoplasm of upper lobe, right bronchus or lung: Secondary | ICD-10-CM | POA: Diagnosis present

## 2022-02-04 MED ORDER — GADOBUTROL 1 MMOL/ML IV SOLN
9.0000 mL | Freq: Once | INTRAVENOUS | Status: AC | PRN
  Administered 2022-02-04: 9 mL via INTRAVENOUS

## 2022-02-05 NOTE — Telephone Encounter (Signed)
Spoke with patient and informed of brain MRI results. Questioned if pt had time to discuss transferring care to [place] to be closer to daughter and pt stated that he discussed it with her and has decided to keep his care here at [facility] at this time. Informed pt that if decides in the future to transfer his care then to let us know in order to facilitate his referral. Pt verbalized understanding. Nothing further needed at this time.

## 2022-02-07 ENCOUNTER — Encounter: Payer: Self-pay | Admitting: Internal Medicine

## 2022-02-07 ENCOUNTER — Other Ambulatory Visit: Payer: Medicare Other

## 2022-02-07 ENCOUNTER — Encounter
Admission: RE | Admit: 2022-02-07 | Discharge: 2022-02-07 | Disposition: A | Discharge status: Home / Self Care | Payer: Medicare Other | Source: Ambulatory Visit | Attending: Specialist | Admitting: Specialist

## 2022-02-07 DIAGNOSIS — R918 Other nonspecific abnormal finding of lung field: Secondary | ICD-10-CM | POA: Diagnosis present

## 2022-02-07 DIAGNOSIS — F17210 Nicotine dependence, cigarettes, uncomplicated: Secondary | ICD-10-CM | POA: Diagnosis present

## 2022-02-07 DIAGNOSIS — R053 Chronic cough: Secondary | ICD-10-CM

## 2022-02-07 MED ORDER — FLUDEOXYGLUCOSE F - 18 (FDG) INJECTION
11.8000 | Freq: Once | INTRAVENOUS | Status: AC | PRN
  Administered 2022-02-07: 11.8 via INTRAVENOUS

## 2022-02-07 NOTE — Progress Notes (Signed)
Tumor Board Documentation  TRAYVION EMBLETON was presented by Dr Rogue Bussing at our Tumor Board on 02/07/2022, which included representatives from medical oncology, surgical, pharmacy, pulmonology, radiology, pathology, radiation oncology, navigation, research, internal medicine, palliative care.  Javeion currently presents as a new patient, for [place], for new positive pathology with history of the following treatments: active survellience.  Additionally, we reviewed previous medical and familial history, history of present illness, and recent lab results along with all available histopathologic and imaging studies. The tumor board considered available treatment options and made the following recommendations: Concurrent chemo-radiation therapy    The following procedures/referrals were also placed: No orders of the defined types were placed in this encounter.   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  Tumor board is a meeting of clinicians from various specialty areas who evaluate and discuss patients for whom a multidisciplinary approach is being considered. Final determinations in the plan of care are those of the provider(s). The responsibility for follow up of recommendations given during tumor board is that of the provider.   Today's extended care, comprehensive team conference, Abdirizak was not present for the discussion and was not examined.   Multidisciplinary Tumor Board is a multidisciplinary case peer review process.  Decisions discussed in the Multidisciplinary Tumor Board reflect the opinions of the specialists present at the conference without having examined the patient.  Ultimately, treatment and diagnostic decisions rest with the primary provider(s) and the  patient.

## 2022-02-08 ENCOUNTER — Ambulatory Visit
Admission: RE | Admit: 2022-02-08 | Discharge: 2022-02-08 | Disposition: A | Discharge status: Home / Self Care | Payer: Medicare Other | Source: Ambulatory Visit | Attending: Radiation Oncology | Admitting: Radiation Oncology

## 2022-02-08 ENCOUNTER — Inpatient Hospital Stay (HOSPITAL_BASED_OUTPATIENT_CLINIC_OR_DEPARTMENT_OTHER): Payer: Medicare Other | Admitting: Internal Medicine

## 2022-02-08 ENCOUNTER — Encounter: Payer: Self-pay | Admitting: Internal Medicine

## 2022-02-08 ENCOUNTER — Encounter: Payer: Self-pay | Admitting: *Deleted

## 2022-02-08 ENCOUNTER — Other Ambulatory Visit: Payer: Self-pay | Admitting: Physician Assistant

## 2022-02-08 DIAGNOSIS — Z7982 Long term (current) use of aspirin: Secondary | ICD-10-CM | POA: Insufficient documentation

## 2022-02-08 DIAGNOSIS — E785 Hyperlipidemia, unspecified: Secondary | ICD-10-CM | POA: Insufficient documentation

## 2022-02-08 DIAGNOSIS — F17210 Nicotine dependence, cigarettes, uncomplicated: Secondary | ICD-10-CM | POA: Insufficient documentation

## 2022-02-08 DIAGNOSIS — I2510 Atherosclerotic heart disease of native coronary artery without angina pectoris: Secondary | ICD-10-CM | POA: Insufficient documentation

## 2022-02-08 DIAGNOSIS — C3411 Malignant neoplasm of upper lobe, right bronchus or lung: Secondary | ICD-10-CM

## 2022-02-08 DIAGNOSIS — Z7984 Long term (current) use of oral hypoglycemic drugs: Secondary | ICD-10-CM | POA: Insufficient documentation

## 2022-02-08 DIAGNOSIS — J449 Chronic obstructive pulmonary disease, unspecified: Secondary | ICD-10-CM | POA: Insufficient documentation

## 2022-02-08 DIAGNOSIS — Z79899 Other long term (current) drug therapy: Secondary | ICD-10-CM | POA: Insufficient documentation

## 2022-02-08 DIAGNOSIS — C3412 Malignant neoplasm of upper lobe, left bronchus or lung: Secondary | ICD-10-CM | POA: Insufficient documentation

## 2022-02-08 DIAGNOSIS — E119 Type 2 diabetes mellitus without complications: Secondary | ICD-10-CM | POA: Insufficient documentation

## 2022-02-08 DIAGNOSIS — I10 Essential (primary) hypertension: Secondary | ICD-10-CM | POA: Insufficient documentation

## 2022-02-08 LAB — GLUCOSE, CAPILLARY: Glucose-Capillary: 103 mg/dL — ABNORMAL HIGH (ref 70–99)

## 2022-02-08 MED ORDER — PROCHLORPERAZINE MALEATE 10 MG PO TABS: 10.0000 mg | ORAL_TABLET | Freq: Four times a day (QID) | ORAL | 1 refills | Status: DC | PRN

## 2022-02-08 MED ORDER — LIDOCAINE-PRILOCAINE 2.5-2.5 % EX CREA
TOPICAL_CREAM | CUTANEOUS | 3 refills | Status: AC
Start: 2022-02-08 — End: ?

## 2022-02-08 MED ORDER — ONDANSETRON HCL 8 MG PO TABS: ORAL_TABLET | ORAL | 1 refills | Status: AC

## 2022-02-08 MED ORDER — ONDANSETRON 8 MG PO TBDP: 8.0000 mg | ORAL_TABLET | Freq: Three times a day (TID) | ORAL | 1 refills | Status: DC | PRN

## 2022-02-08 MED ORDER — NICOTINE 21-14-7 MG/24HR TD KIT
1.0000 | PACK | Freq: Every day | TRANSDERMAL | 1 refills | Status: DC
Start: 2022-02-08 — End: 2022-02-19

## 2022-02-08 NOTE — Progress Notes (Signed)
Met with patient during follow up visit with Dr. Rogue Bussing. All questions answered during visit. Reviewed upcoming appts with patient. Instructed to call with any questions. Pt verbalized understanding. Will follow up with patient next week during CT simulation.

## 2022-02-08 NOTE — Progress Notes (Unsigned)
[facility] NOTE  Patient Care Team: [facility] as PCP - General Telford Nab, RN as Oncology Nurse Navigator Cammie Sickle, MD as Consulting Physician (Oncology)  CHIEF COMPLAINTS/PURPOSE OF CONSULTATION: lung cancer  #  Oncology History Overview Note  IMPRESSION: 1. Thick-walled cavitary lesion of the posterior right upper lobe abutting the major fissure measuring 3.3 x 3.3 cm, highly concerning for primary lung malignancy or metastasis. 2. Although evaluation is limited by lack of intravenous contrast, suspect significant left hilar lymphadenopathy or mass, with apparent obstruction of the left upper lobe bronchus and severe narrowing of the left lower lobe bronchus. This finding is likewise highly concerning for primary lung malignancy or alternately nodal metastasis. Contrast enhanced CT may be helpful to more clearly evaluate the hilum. 3. There may be additional right hilar lymphadenopathy, again assessment limited by noncontrast CT. 4. Scattered heterogeneous and ground-glass airspace opacity throughout the upper lobes and right lower lobe, possibly reflecting postobstructive airspace disease in the left lung generally nonspecific. 5. Emphysema. 6. Coronary artery disease.   Aortic Atherosclerosis (ICD10-I70.0) and Emphysema (ICD10-J43.9).     Electronically Signed   By: Delanna Ahmadi M.D.   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  Ambulating independently. Louis Sullivan 71 y.o.  male history of smoking is here for further evaluation and recommendations for newly diagnosed lung cancer/review results of the PET scan.  In the interim patient denies any worsening shortness of breath or cough. Patient has chronic mild shortness of breath.  Chronic mild cough not any worse.  He has been using his inhalers ProAir.  Denies any headaches or vision changes.  Review of Systems  Constitutional:  Negative for chills, diaphoresis, fever, malaise/fatigue and weight loss.  HENT:  Negative for nosebleeds and sore throat.   Eyes:  Negative for double vision.  Respiratory:  Positive for cough and shortness of breath. Negative for hemoptysis, sputum production and wheezing.   Cardiovascular:  Negative for chest pain, palpitations, orthopnea and leg swelling.  Gastrointestinal:  Negative for abdominal pain, blood in stool, constipation, diarrhea, heartburn, melena, nausea and vomiting.  Genitourinary:  Negative for dysuria, frequency and urgency.  Musculoskeletal:  Positive for joint pain. Negative for back pain.  Skin: Negative.  Negative for itching and rash.  Neurological:  Negative for dizziness, tingling, focal weakness, weakness and headaches.  Endo/Heme/Allergies:  Does not bruise/bleed easily.  Psychiatric/Behavioral:  Negative for depression. The patient is not nervous/anxious and does not have insomnia.     MEDICAL HISTORY:  Past Medical History:  Diagnosis Date   COPD (chronic obstructive pulmonary disease) ([place])    Coronary artery disease    1 stent placed in about 2008   Diabetes mellitus without complication (HCC)    Dyspnea    Headache    migraines   Hyperlipidemia    Hypertension  Pneumonia 12/2021   hospitalized at [facility]: Past Surgical History:  Procedure Laterality Date   CARDIAC CATHETERIZATION     COLONOSCOPY     VIDEO  BRONCHOSCOPY WITH ENDOBRONCHIAL NAVIGATION N/A 01/23/2022   Procedure: VIDEO BRONCHOSCOPY WITH ENDOBRONCHIAL NAVIGATION;  Surgeon: Ottie Glazier, MD;  Location: [facility] ORS;  Service: Thoracic;  Laterality: N/A;   VIDEO BRONCHOSCOPY WITH ENDOBRONCHIAL ULTRASOUND N/A 01/23/2022   Procedure: VIDEO BRONCHOSCOPY WITH ENDOBRONCHIAL ULTRASOUND;  Surgeon: Ottie Glazier, MD;  Location: [facility] ORS;  Service: Thoracic;  Laterality: N/A;    SOCIAL HISTORY: Social History   Socioeconomic History   Marital status: Widowed    Spouse name: Not on file   Number of children: Not on file   Years of education: Not on file   Highest education level: Not on file  Occupational History   Not on file  Tobacco Use   Smoking status: Every Day    Packs/day: 1.00    Years: 20.00    Pack years: 20.00    Types: Cigarettes    Last attempt to quit: 01/03/2022    Years since quitting: 0.1   Smokeless tobacco: Never   Tobacco comments:    Request to MD for Nicotine patch to help him quit smoking.  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 Lives in [place] self. Son passed in 2021;MI wife passed away in 1990s/aneurysm; daughter in [place].    Social Determinants of Health   Financial Resource Strain: Not on file  Food Insecurity: Not on file  Transportation Needs: Not on file  Physical Activity: Not on file  Stress: Not on file  Social Connections: Not on file  Intimate Partner Violence: Not on file    FAMILY HISTORY: Family History  Problem Relation Age of Onset   Throat cancer Brother     ALLERGIES:  has No Known Allergies.  MEDICATIONS:  Current Outpatient Medications  Medication Sig Dispense Refill   albuterol (PROVENTIL) (5 MG/ML) 0.5% nebulizer solution Take 2.5 mg by  nebulization every morning.     albuterol (VENTOLIN HFA) 108 (90 Base) MCG/ACT inhaler Inhale 2 puffs into the lungs every 4 (four) hours as needed for wheezing or shortness of breath.     aspirin EC 81 MG tablet Take 81 mg by mouth daily.     atorvastatin (LIPITOR) 40 MG tablet Take 40 mg by mouth every morning.     Cholecalciferol (VITAMIN D3 PO) Take 1 tablet by mouth daily at 6 (six) AM.     lidocaine-prilocaine (EMLA) cream Apply on the port. 30 -45 min  prior to port access. 30 g 3   losartan (COZAAR) 100 MG tablet Take 100 mg by mouth every evening.     losartan (COZAAR) 50 MG tablet Take 50 mg by mouth in the morning.     metFORMIN (GLUCOPHAGE) 1000 MG tablet Take 1,000 mg by mouth daily with breakfast.     Nicotine 21-14-7 MG/24HR KIT Place 1 patch onto the skin daily. 60 kit 1   ondansetron (ZOFRAN) 8 MG tablet One pill every 8 hours as needed for nausea/vomitting. 40 tablet 1   OXYGEN Inhale 2 L into the lungs at bedtime.     prochlorperazine (COMPAZINE) 10 MG  tablet Take 1 tablet (10 mg total) by mouth every 6 (six) hours as needed for nausea or vomiting. 40 tablet 1   traZODone (DESYREL) 50 MG tablet Take 50 mg by mouth at bedtime.     No current facility-administered medications for this visit.      Marland Kitchen  PHYSICAL EXAMINATION: ECOG PERFORMANCE STATUS: 1 - Symptomatic but completely ambulatory  Vitals:   02/08/22 0800  BP: 138/77  Pulse: 75  Temp: (!) 96.8 F (36 C)  SpO2: 94%   Filed Weights   02/08/22 0800  Weight: 206 lb (93.4 kg)    Physical Exam Vitals and nursing note reviewed.  HENT:     Head: Normocephalic and atraumatic.     Mouth/Throat:     Pharynx: Oropharynx is clear.  Eyes:     Extraocular Movements: Extraocular movements intact.     Pupils: Pupils are equal, round, and reactive to light.  Cardiovascular:     Rate and Rhythm: Normal rate and regular rhythm.  Pulmonary:     Comments: Decreased breath sounds bilaterally.  Abdominal:      Palpations: Abdomen is soft.  Musculoskeletal:        General: Normal range of motion.     Cervical back: Normal range of motion.  Skin:    General: Skin is warm.  Neurological:     General: No focal deficit present.     Mental Status: He is alert and oriented to person, place, and time.  Psychiatric:        Behavior: Behavior normal.        Judgment: Judgment normal.     LABORATORY DATA:  I have reviewed the data as listed Lab Results  Component Value Date   WBC 5.1 02/01/2022   HGB 15.7 02/01/2022   HCT 50.6 02/01/2022   MCV 94.4 02/01/2022   PLT 234 02/01/2022   Recent Labs    11/22/21 1702 02/01/22 1011  NA 136 137  K 3.8 4.2  CL 99 100  CO2 26 30  GLUCOSE 111* 107*  BUN 13 9  CREATININE 0.90 0.85  CALCIUM 8.9 9.0  GFRNONAA >60 >60  PROT  --  7.7  ALBUMIN  --  4.0  AST  --  15  ALT  --  13  ALKPHOS  --  89  BILITOT  --  0.6    RADIOGRAPHIC STUDIES: I have personally reviewed the radiological images as listed and agreed with the findings in the report. X-ray chest PA or AP  Result Date: 01/23/2022 CLINICAL DATA:  Status post bronchoscopy and biopsy. EXAM: CHEST  1 VIEW COMPARISON:  CT chest from 2 days ago. Chest x-ray dated November 22, 2021. FINDINGS: The heart size and mediastinal contours are within normal limits. Normal pulmonary vascularity. Small ill-defined nodular opacities at both lung bases and in the left parahilar upper lobe are not significantly changed. Grossly unchanged cavitary mass in the right upper lobe. No pleural effusion or pneumothorax. No acute osseous abnormality. IMPRESSION: 1. No pneumothorax status post bronchoscopy. 2. Unchanged cavitary mass in the right upper lobe. 3. Unchanged ill-defined nodular opacities at both lung bases and in the left perihilar upper lobe. Electronically Signed   By: Titus Dubin M.D.   On: 01/23/2022 15:40   MR Brain W Wo Contrast  Result Date: 02/05/2022 CLINICAL DATA:  Metastatic disease evaluation.  EXAM: MRI HEAD WITHOUT AND WITH CONTRAST TECHNIQUE: Multiplanar, multiecho pulse sequences of the brain and surrounding structures were obtained without and with  intravenous contrast. CONTRAST:  58m GADAVIST GADOBUTROL 1 MMOL/ML IV SOLN COMPARISON:  05/01/2011 FINDINGS: Brain: No restricted diffusion to suggest acute or subacute infarct. No acute hemorrhage, mass, mass effect, or midline shift. No hydrocephalus or extra-axial collection. No abnormal parenchymal or meningeal enhancement. Scattered T2 hyperintense signal in the periventricular white matter, likely the sequela of minimal chronic small vessel ischemic disease. Normal pituitary. Normal craniocervical junction. Vascular: Patent arterial flow voids. Skull and upper cervical spine: Normal marrow signal. Sinuses/Orbits: Mild mucosal thickening. Dysconjugate gaze. The orbits are otherwise unremarkable. Other: The mastoids are well aerated. IMPRESSION: No acute intracranial process. No evidence of metastatic disease in the brain. Electronically Signed   By: AMerilyn BabaM.D.   On: 02/05/2022 03:51   NM PET Image Initial (PI) Skull Base To Thigh (F-18 FDG)  Result Date: 02/08/2022 CLINICAL DATA:  Initial treatment strategy for lung mass. EXAM: NUCLEAR MEDICINE PET SKULL BASE TO THIGH TECHNIQUE: mCi F-18 FDG was injected intravenously. Full-ring PET imaging was performed from the skull base to thigh after the radiotracer. CT data was obtained and used for attenuation correction and anatomic localization. Fasting blood glucose:  mg/dl COMPARISON:  Chest CT dated May eighth 2023 FINDINGS: Mediastinal blood pool activity: SUV max 2.6 Liver activity: SUV max 3.0 NECK: No hypermetabolic lymph nodes in the neck. Incidental CT findings: none CHEST: Hypermetabolic cavitary mass of the right upper lobe measuring 3.3 cm, SUV max of 28.6. Hypermetabolic left hilar mass versus conglomerate hilar lymphadenopathy measuring approximally 4.4 x 2.3 cm, SUV max of 14.3.  Peribronchovascular nodular opacities of the of the bilateral lower lobes and left upper lobe are similar prior exam and demonstrate no significant FDG uptake. Incidental CT findings: Left main and three-vessel coronary artery calcifications. Atherosclerotic disease of the thoracic aorta. Centrilobular emphysema. ABDOMEN/PELVIS: No abnormal hypermetabolic activity within the liver, pancreas, adrenal glands, or spleen. No hypermetabolic lymph nodes in the abdomen or pelvis. Incidental CT findings: Bilateral simple appearing renal cysts with no FDG activity, no further follow-up imaging is recommended for these lesions. Nonobstructing right renal stone. Wall thickening of the bladder. Atherosclerotic disease of the abdominal aorta. Mildly enlarged left external iliac lymph node measuring 1.2 cm in short axis on series 2, image 195 with no significant FDG uptake, SUV max of 1.8 SKELETON: No focal hypermetabolic activity to suggest skeletal metastasis. Incidental CT findings: Focal soft tissue FDG uptake adjacent to the right humerus, likely due to tendinitis. IMPRESSION: 1. Hypermetabolic cavitary mass of the right upper lobe, compatible with primary lung malignancy. 2. Hypermetabolic left hilar mass versus conglomerate hilar lymphadenopathy, possibly due to nodal metastatic disease or primary lung malignancy. 3. Peribronchovascular nodular opacities of the of the bilateral lower lobes and left upper lobe are similar prior exam and demonstrate no significant FDG, potentially infectious. Recommend attention on follow-up. 4. Mildly enlarged left external iliac lymph node with no significant FDG uptake, likely reactive. Recommend attention on follow-up. 5. No evidence of distant metastatic disease in the abdomen or pelvis. Electronically Signed   By: LYetta GlassmanM.D.   On: 02/08/2022 09:29   DG C-Arm 1-60 Min-No Report  Result Date: 01/23/2022 Fluoroscopy was utilized by the requesting physician.  No radiographic  interpretation.   CT Super D Chest Wo Contrast  Result Date: 01/21/2022 CLINICAL DATA:  Navigational biopsy planning, cavitary lung lesion, emphysema EXAM: CT CHEST WITHOUT CONTRAST TECHNIQUE: Multidetector CT imaging of the chest was performed using thin slice collimation for electromagnetic bronchoscopy planning purposes, without intravenous contrast. RADIATION  DOSE REDUCTION: This exam was performed according to the departmental dose-optimization program which includes automated exposure control, adjustment of the mA and/or kV according to patient size and/or use of iterative reconstruction technique. COMPARISON:  None Available. FINDINGS: Cardiovascular: Aortic atherosclerosis. Three-vessel coronary artery calcifications. Normal heart size. No pericardial effusion. Mediastinum/Nodes: Although evaluation is limited by lack of intravenous contrast, suspect significant left hilar lymphadenopathy or mass, with apparent obstruction of the left upper lobe bronchus and severe narrowing of the left lower lobe bronchus (series 2, image 30). There may be additional right hilar lymphadenopathy, again assessment limited (series 2, image 30). Thyroid gland, trachea, and esophagus demonstrate no significant findings. Lungs/Pleura: Moderate to severe centrilobular emphysema. Thick-walled cavitary lesion of the posterior right upper lobe abutting the major fissure measuring 3.3 x 3.3 cm (series 3, image 14). Scattered heterogeneous and ground-glass airspace opacity throughout the upper lobes and right lower lobe (series 3, image 35, 46). No pleural effusion or pneumothorax. Upper Abdomen: No acute abnormality. Musculoskeletal: No chest wall abnormality. No suspicious osseous lesions identified. IMPRESSION: 1. Thick-walled cavitary lesion of the posterior right upper lobe abutting the major fissure measuring 3.3 x 3.3 cm, highly concerning for primary lung malignancy or metastasis. 2. Although evaluation is limited by lack of  intravenous contrast, suspect significant left hilar lymphadenopathy or mass, with apparent obstruction of the left upper lobe bronchus and severe narrowing of the left lower lobe bronchus. This finding is likewise highly concerning for primary lung malignancy or alternately nodal metastasis. Contrast enhanced CT may be helpful to more clearly evaluate the hilum. 3. There may be additional right hilar lymphadenopathy, again assessment limited by noncontrast CT. 4. Scattered heterogeneous and ground-glass airspace opacity throughout the upper lobes and right lower lobe, possibly reflecting postobstructive airspace disease in the left lung generally nonspecific. 5. Emphysema. 6. Coronary artery disease. Aortic Atherosclerosis (ICD10-I70.0) and Emphysema (ICD10-J43.9). Electronically Signed   By: Delanna Ahmadi M.D.   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  Noncontrast CT scan May 2023- Thick-walled cavitary lesion of the posterior right upper lobe abutting the major fissure measuring 3.3 x 3.3 cm; suspect significant left hilar lymphadenopathy or mass, with apparent obstruction of the left upper lobe bronchus and severe narrowing of the left lower lobe bronchus.  Possible right hilar lymphadenopathy, again assessment limited by noncontrast CT. awaiting a PET scan on May 25.;  S/p  discussion at tumor conference on May 25. Pending-r NGS-F-ONE.     # Pending PET scan done 5/25; brain MRI negative for metastasis.  Discussed with Dr. Donella Stade.  #Discussed that patient is recommended d chemotherapy-weekly CarboTaxol along with radiation 6 to 8 weeks.  After the chemoradiation patient will be recommended immunotherapy for total of 1 year.  The goal of treatment is cure; however the  chance of cure is unfortunately small 10 to 20%.  #Discussed with Dr. Sonny Masters is to radiate left hilar mass-concurrently with chemotherapy.  We will plan SBRT to the right upper lobe lung lesion.  # Smoking: Active smoker; quit smoking recently.  We will send the nicotine patches.  # COPD: Stable- on proair- continue inhalers as per pulmonary.  #Diabetes-monitor closely on chemotherapy.  #Social issues: Discussed with patient's daughter who initially felt patient would be better transferring care  to [place] with her.  However, patient/family decided continue care locally.  # DISPOSITION: # Follow up in week of week of June 5th. MD- labs- cbc/cmp; carbo-taxol weekly- Dr.B  # I reviewed the blood work- with the patient in detail; also reviewed the imaging independently [as summarized above]; and with the patient in detail.   Addendum: IMPRESSION: 1. Hypermetabolic cavitary mass of the right upper lobe, compatible with primary lung malignancy. 2. Hypermetabolic left hilar mass versus conglomerate hilar lymphadenopathy, possibly due to nodal metastatic disease or primary lung malignancy. 3. Peribronchovascular nodular opacities of the of the bilateral lower lobes and left upper lobe are similar prior exam and demonstrate no significant FDG, potentially infectious. Recommend attention on follow-up. 4. Mildly enlarged left external iliac lymph node with no significant FDG uptake, likely reactive. Recommend attention on follow-up. 5. No evidence of distant metastatic disease in the abdomen or pelvis.     Electronically Signed   By: Yetta Glassman M.D.   On: 02/08/2022 09:29    All questions were answered. The patient knows to call the clinic with any problems, questions or concerns.       Cammie Sickle, MD 02/09/2022 1:10 PM

## 2022-02-08 NOTE — Progress Notes (Signed)
Spoke with patient 02/08/22 @ 12:35 pm. Louis Sullivan over pre-procedure instructions to include the need to arrive at 10:00 for 11:00 procedure, need to be NPO after midnight on night prior to procedure, need to hold Aspirin and Metformin morning of procedure and need for driver post procedure. Patient verbalized understanding.

## 2022-02-08 NOTE — Assessment & Plan Note (Addendum)
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  Noncontrast CT scan May 2023- Thick-walled cavitary lesion of the posterior right upper lobe abutting the major fissure measuring 3.3 x 3.3 cm; suspect significant left hilar lymphadenopathy or mass, with apparent obstruction of the left upper lobe bronchus and severe narrowing of the left lower lobe bronchus.  Possible right hilar lymphadenopathy, again assessment limited by noncontrast CT. awaiting a PET scan on May 25.;  S/p  discussion at tumor conference on May 25. Pending-r NGS-F-ONE.   # Pending PET scan done 5/25; brain MRI negative for metastasis.  Discussed with Dr. Donella Stade.  #Discussed that patient is recommended d chemotherapy-weekly CarboTaxol along with radiation 6 to 8 weeks.  After the chemoradiation patient will be recommended immunotherapy for total of 1 year.  The goal of treatment is cure; however the chance of cure is unfortunately small 10 to 20%.  #Discussed with Dr. Sonny Masters is to radiate left hilar mass-concurrently with chemotherapy.  We will plan SBRT to the right upper lobe lung lesion.  # Smoking: Active smoker; quit smoking recently.  We will send the nicotine patches.  # COPD: Stable- on proair- continue inhalers as per pulmonary.  #Diabetes-monitor closely on chemotherapy.  #Social issues: Discussed with patient's daughter who initially felt patient would be better transferring care to [facility] with her.  However, patient/family decided continue care locally.  # DISPOSITION: # Follow up in week of week of June 5th. MD- labs- cbc/cmp; carbo-taxol weekly- Dr.B  # I reviewed the blood work- with the patient in detail; also reviewed the imaging independently [as summarized above]; and with the patient in detail.   # 40 minutes face-to-face with the patient discussing the above  plan of care; more than 50% of time spent on prognosis/ natural history; counseling and coordination.   Addendum: IMPRESSION: 1. Hypermetabolic cavitary mass of the right upper lobe, compatible with primary lung malignancy. 2. Hypermetabolic left hilar mass versus conglomerate hilar lymphadenopathy, possibly due to nodal metastatic disease or primary lung malignancy. 3. Peribronchovascular nodular opacities of the of the bilateral lower lobes and left upper lobe are similar prior exam and demonstrate no significant FDG, potentially infectious. Recommend attention on follow-up. 4. Mildly enlarged left external iliac lymph node with no significant FDG uptake, likely reactive. Recommend attention on follow-up. 5. No evidence of distant metastatic disease in the abdomen or pelvis.   Electronically Signed   By: Yetta Glassman M.D.   On: 02/08/2022 09:29

## 2022-02-08 NOTE — Progress Notes (Signed)
START ON PATHWAY REGIMEN - Non-Small Cell Lung     Administer weekly:     Paclitaxel      Carboplatin   **Always confirm dose/schedule in your pharmacy ordering system**  Patient Characteristics: Preoperative or Nonsurgical Candidate (Clinical Staging), Stage III - Nonsurgical Candidate (Nonsquamous and Squamous), PS = 0, 1 Therapeutic Status: Preoperative or Nonsurgical Candidate (Clinical Staging) AJCC T Category: cT2a AJCC N Category: cN3 AJCC M Category: cM0 AJCC 8 Stage Grouping: IIIB ECOG Performance Status: 1 Intent of Therapy: Curative Intent, Discussed with Patient

## 2022-02-08 NOTE — Progress Notes (Unsigned)
Patient is interested in getting a prescription for nicotine patch to help him quit smoking.

## 2022-02-08 NOTE — Consult Note (Signed)
NEW PATIENT EVALUATION  Name: Louis Sullivan  MRN: [id number]  Date:   02/08/2022     DOB: [birth year]   This 71 y.o. 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  REFERRING PHYSICIAN: [facility]  CHIEF COMPLAINT: No chief complaint on file.   DIAGNOSIS: The encounter diagnosis was Malignant neoplasm of upper lobe of right lung ([place]).   PREVIOUS INVESTIGATIONS:  Pathology reports reviewed CT scans PET CT scans reviewed Clinical notes reviewed  HPI: Patient is a 71 year old male who was treated for pneumonia prior approximate month ago.  He had several lung lesions that not cleared with antibiotic therapy and further work-up including a CT scan showed a cavitary mass in the right upper lobe.  He also had left hilar mass.  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  PET CT scan showed both lesions were hypermetabolic.  The left hilar mass had involvement of hilar lymphadenopathy and directly invaded into the mediastinum.  Patient does have a mild cough no hemoptysis or chest tightness he has no bone pain.  There is no other disease outside of the chest.  He has been seen by medical oncology and present at our weekly tumor conference.  PLANNED TREATMENT REGIMEN: Concurrent chemoradiation therapy for left hilar mass and SBRT for right upper lobe mass  PAST MEDICAL HISTORY:  has a past medical history of COPD (chronic obstructive pulmonary disease) ([place]), Coronary artery disease, Diabetes mellitus without complication ([place]), Dyspnea, Headache, Hyperlipidemia, Hypertension, and Pneumonia (12/2021).    PAST SURGICAL HISTORY:  Past Surgical History:  Procedure Laterality Date   CARDIAC CATHETERIZATION     COLONOSCOPY     VIDEO BRONCHOSCOPY WITH ENDOBRONCHIAL  NAVIGATION N/A 01/23/2022   Procedure: VIDEO BRONCHOSCOPY WITH ENDOBRONCHIAL NAVIGATION;  Surgeon: Ottie Glazier, MD;  Location: [facility] ORS;  Service: Thoracic;  Laterality: N/A;   VIDEO BRONCHOSCOPY WITH ENDOBRONCHIAL ULTRASOUND N/A 01/23/2022   Procedure: VIDEO BRONCHOSCOPY WITH ENDOBRONCHIAL ULTRASOUND;  Surgeon: Ottie Glazier, MD;  Location: [facility] ORS;  Service: Thoracic;  Laterality: N/A;    FAMILY HISTORY: family history includes Throat cancer in his brother.  SOCIAL HISTORY:  reports that he has been smoking cigarettes. He has a 20.00 pack-year smoking history. He has never used smokeless tobacco. He reports that he does not currently use alcohol. He reports that he does not use drugs.  ALLERGIES: Patient has no known allergies.  MEDICATIONS:  Current Outpatient Medications  Medication Sig Dispense Refill   albuterol (PROVENTIL) (5 MG/ML) 0.5% nebulizer solution Take 2.5 mg by nebulization every morning.     albuterol (VENTOLIN HFA) 108 (90 Base) MCG/ACT inhaler Inhale 2 puffs into the lungs every 4 (four) hours as needed for wheezing or shortness of breath.     aspirin EC 81 MG tablet Take 81 mg by mouth daily.     atorvastatin (LIPITOR) 40 MG tablet Take 40 mg by mouth every morning.     Cholecalciferol (VITAMIN D3 PO) Take 1 tablet by mouth daily at 6 (six) AM.     lidocaine-prilocaine (EMLA) cream Apply on the port. 30 -45 min  prior to port access. 30 g 3   losartan (COZAAR) 100 MG tablet Take 100 mg by mouth every evening.     losartan (COZAAR) 50 MG tablet Take 50  mg by mouth in the morning.     metFORMIN (GLUCOPHAGE) 1000 MG tablet Take 1,000 mg by mouth daily with breakfast.     Nicotine 21-14-7 MG/24HR KIT Place 1 patch onto the skin daily. 60 kit 1   ondansetron (ZOFRAN) 8 MG tablet One pill every 8 hours as needed for nausea/vomitting. 40 tablet 1   OXYGEN Inhale 2 L into the lungs at bedtime.     prochlorperazine (COMPAZINE) 10 MG tablet Take 1 tablet (10 mg total) by  mouth every 6 (six) hours as needed for nausea or vomiting. 40 tablet 1   traZODone (DESYREL) 50 MG tablet Take 50 mg by mouth at bedtime.     No current facility-administered medications for this encounter.    ECOG PERFORMANCE STATUS:  0 - Asymptomatic  REVIEW OF SYSTEMS: Patient denies any weight loss, fatigue, weakness, fever, chills or night sweats. Patient denies any loss of vision, blurred vision. Patient denies any ringing  of the ears or hearing loss. No irregular heartbeat. Patient denies heart murmur or history of fainting. Patient denies any chest pain or pain radiating to her upper extremities. Patient denies any shortness of breath, difficulty breathing at night, cough or hemoptysis. Patient denies any swelling in the lower legs. Patient denies any nausea vomiting, vomiting of blood, or coffee ground material in the vomitus. Patient denies any stomach pain. Patient states has had normal bowel movements no significant constipation or diarrhea. Patient denies any dysuria, hematuria or significant nocturia. Patient denies any problems walking, swelling in the joints or loss of balance. Patient denies any skin changes, loss of hair or loss of weight. Patient denies any excessive worrying or anxiety or significant depression. Patient denies any problems with insomnia. Patient denies excessive thirst, polyuria, polydipsia. Patient denies any swollen glands, patient denies easy bruising or easy bleeding. Patient denies any recent infections, allergies or URI. Patient "s visual fields have not changed significantly in recent time.   PHYSICAL EXAM: There were no vitals taken for this visit. Well-developed well-nourished patient in NAD. HEENT reveals PERLA, EOMI, discs not visualized.  Oral cavity is clear. No oral mucosal lesions are identified. Neck is clear without evidence of cervical or supraclavicular adenopathy. Lungs are clear to A&P. Cardiac examination is essentially unremarkable with  regular rate and rhythm without murmur rub or thrill. Abdomen is benign with no organomegaly or masses noted. Motor sensory and DTR levels are equal and symmetric in the upper and lower extremities. Cranial nerves II through XII are grossly intact. Proprioception is intact. No peripheral adenopathy or edema is identified. No motor or sensory levels are noted. Crude visual fields are within normal range.  LABORATORY DATA: Cytology and pathology reports reviewed    RADIOLOGY RESULTS: CT scans PET CT scans all reviewed compatible with above-stated findings   IMPRESSION: To be treated as 2 separate primary squamous cell carcinomas.  Left lung is a stage III disease and right lung is stage Ib disease in 71 year old male  PLAN: At this time we will treat as 2 separate primary lesions of the lung.  The left lung for stage IIIa disease would treat with IMRT radiation therapy up to 70 Gray over 7 weeks with concurrent chemotherapy.  Risks and benefits of treatment including development of cough possible radiation esophagitis fatigue alteration of blood count skin reaction all were reviewed in detail with the patient.  After completion of chemoradiation would treat his right lung with SBRT 60 Gray in 5 fractions.  Low side effect  profile of SBRT was also discussed with the patient.  We will coordinate chemotherapy with medical oncology.  There will be extra effort by both professional staff as well as technical staff to coordinate and manage concurrent chemoradiation and ensuing side effects during his treatments. I have personally set up and ordered CT simulation for early next week.  I would like to take this opportunity to thank you for allowing me to participate in the care of your patient.Noreene Filbert, MD

## 2022-02-09 ENCOUNTER — Encounter: Payer: Self-pay | Admitting: Internal Medicine

## 2022-02-12 ENCOUNTER — Encounter: Payer: Self-pay | Admitting: Radiology

## 2022-02-12 ENCOUNTER — Ambulatory Visit: Admission: RE | Admit: 2022-02-12 | Payer: Medicare Other | Source: Ambulatory Visit

## 2022-02-12 ENCOUNTER — Ambulatory Visit
Admission: RE | Admit: 2022-02-12 | Discharge: 2022-02-12 | Disposition: A | Discharge status: Home / Self Care | Payer: Medicare Other | Source: Ambulatory Visit | Attending: Internal Medicine | Admitting: Internal Medicine

## 2022-02-12 ENCOUNTER — Encounter: Payer: Self-pay | Admitting: *Deleted

## 2022-02-12 ENCOUNTER — Other Ambulatory Visit: Payer: Self-pay

## 2022-02-12 DIAGNOSIS — E119 Type 2 diabetes mellitus without complications: Secondary | ICD-10-CM | POA: Insufficient documentation

## 2022-02-12 DIAGNOSIS — J449 Chronic obstructive pulmonary disease, unspecified: Secondary | ICD-10-CM | POA: Diagnosis not present

## 2022-02-12 DIAGNOSIS — I10 Essential (primary) hypertension: Secondary | ICD-10-CM | POA: Diagnosis not present

## 2022-02-12 DIAGNOSIS — Z87891 Personal history of nicotine dependence: Secondary | ICD-10-CM | POA: Diagnosis not present

## 2022-02-12 DIAGNOSIS — Z7984 Long term (current) use of oral hypoglycemic drugs: Secondary | ICD-10-CM | POA: Diagnosis not present

## 2022-02-12 DIAGNOSIS — C3402 Malignant neoplasm of left main bronchus: Secondary | ICD-10-CM | POA: Diagnosis present

## 2022-02-12 DIAGNOSIS — C3411 Malignant neoplasm of upper lobe, right bronchus or lung: Secondary | ICD-10-CM | POA: Diagnosis present

## 2022-02-12 DIAGNOSIS — C3412 Malignant neoplasm of upper lobe, left bronchus or lung: Secondary | ICD-10-CM | POA: Diagnosis present

## 2022-02-12 DIAGNOSIS — I2510 Atherosclerotic heart disease of native coronary artery without angina pectoris: Secondary | ICD-10-CM | POA: Diagnosis not present

## 2022-02-12 HISTORY — PX: IR IMAGING GUIDED PORT INSERTION: IMG5740

## 2022-02-12 LAB — GLUCOSE, CAPILLARY: Glucose-Capillary: 102 mg/dL — ABNORMAL HIGH (ref 70–99)

## 2022-02-12 MED ORDER — FENTANYL CITRATE (PF) 100 MCG/2ML IJ SOLN
INTRAMUSCULAR | Status: AC
  Filled 2022-02-12: qty 4

## 2022-02-12 MED ORDER — HEPARIN SOD (PORK) LOCK FLUSH 100 UNIT/ML IV SOLN
INTRAVENOUS | Status: AC
  Administered 2022-02-12: 500 [IU]
  Filled 2022-02-12: qty 5

## 2022-02-12 MED ORDER — SODIUM CHLORIDE 0.9 % IV SOLN: INTRAVENOUS | Status: DC

## 2022-02-12 MED ORDER — MIDAZOLAM HCL 2 MG/2ML IJ SOLN
INTRAMUSCULAR | Status: AC
  Filled 2022-02-12: qty 4

## 2022-02-12 MED ORDER — MIDAZOLAM HCL 2 MG/2ML IJ SOLN
INTRAMUSCULAR | Status: AC | PRN
  Administered 2022-02-12: 1 mg via INTRAVENOUS

## 2022-02-12 MED ORDER — LIDOCAINE-EPINEPHRINE 1 %-1:100000 IJ SOLN
INTRAMUSCULAR | Status: AC
  Administered 2022-02-12: 11 mL
  Filled 2022-02-12: qty 1

## 2022-02-12 MED ORDER — FENTANYL CITRATE (PF) 100 MCG/2ML IJ SOLN
INTRAMUSCULAR | Status: AC | PRN
  Administered 2022-02-12: 50 ug via INTRAVENOUS

## 2022-02-12 NOTE — H&P (Signed)
Chief Complaint: Patient was seen in consultation today for port-a-catheter placement   Referring Physician(s): Cammie Sickle  Supervising Physician: Jacqulynn Cadet  Patient Status: [facility] - Out-pt  History of Present Illness: Louis Sullivan is a 71 y.o. male with a medical history significant for DM, HTN, COPD, CAD and recently diagnosed non-small cell lung cancer. Imaging obtained earlier this year identified a thick-walled cavitary lesion in the right upper lobe and a left hilar mass/lymphadenopathy. He underwent bronchoscopy with tissue biopsy 01/23/22 and pathology returned positive for non-small cell carcinoma. His oncology team is preparing him for chemotherapy and radiation therapy.    Interventional Radiology has been asked to evaluate this patient for an image-guided port-a-catheter placement to facilitate his treatment plans.   Past Medical History:  Diagnosis Date   COPD (chronic obstructive pulmonary disease) ([place])    Coronary artery disease    1 stent placed in about 2008   Diabetes mellitus without complication ([facility])    Dyspnea    Headache    migraines   Hyperlipidemia    Hypertension    Pneumonia 12/2021   hospitalized at [facility]    Past Surgical History:  Procedure Laterality Date   CARDIAC CATHETERIZATION     COLONOSCOPY     VIDEO BRONCHOSCOPY WITH ENDOBRONCHIAL NAVIGATION N/A 01/23/2022   Procedure: [place];  Surgeon: Ottie Glazier, MD;  Location: [facility] ORS;  Service: Thoracic;  Laterality: N/A;   VIDEO BRONCHOSCOPY WITH ENDOBRONCHIAL ULTRASOUND N/A 01/23/2022   Procedure: VIDEO BRONCHOSCOPY WITH ENDOBRONCHIAL ULTRASOUND;  Surgeon: Ottie Glazier, MD;  Location: [facility] ORS;  Service: Thoracic;  Laterality: N/A;    Allergies: Patient has no known allergies.  Medications: Prior to Admission medications   Medication Sig Start Date End Date Taking? Authorizing Provider  albuterol (PROVENTIL) (5 MG/ML)  0.5% nebulizer solution Take 2.5 mg by nebulization every morning.    [provider]  albuterol (VENTOLIN HFA) 108 (90 Base) MCG/ACT inhaler Inhale 2 puffs into the lungs every 4 (four) hours as needed for wheezing or shortness of breath.    [provider]  aspirin EC 81 MG tablet Take 81 mg by mouth daily.    [provider]  atorvastatin (LIPITOR) 40 MG tablet Take 40 mg by mouth every morning.    [provider]  Cholecalciferol (VITAMIN D3 PO) Take 1 tablet by mouth daily at 6 (six) AM.    [provider]  lidocaine-prilocaine (EMLA) cream Apply on the port. 30 -45 min  prior to port access. 02/08/22   Cammie Sickle, MD  losartan (COZAAR) 100 MG tablet Take 100 mg by mouth every evening.    [provider]  losartan (COZAAR) 50 MG tablet Take 50 mg by mouth in the morning.    [provider]  metFORMIN (GLUCOPHAGE) 1000 MG tablet Take 1,000 mg by mouth daily with breakfast.    [provider]  Nicotine 21-14-7 MG/24HR KIT Place 1 patch onto the skin daily. 02/08/22   Cammie Sickle, MD  ondansetron (ZOFRAN) 8 MG tablet One pill every 8 hours as needed for nausea/vomitting. 02/08/22   Cammie Sickle, MD  OXYGEN Inhale 2 L into the lungs at bedtime.    [provider]  prochlorperazine (COMPAZINE) 10 MG tablet Take 1 tablet (10 mg total) by mouth every 6 (six) hours as needed for nausea or vomiting. 02/08/22   Cammie Sickle, MD  traZODone (DESYREL) 50 MG tablet Take 50 mg by mouth at  bedtime.    [provider]     Family History  Problem Relation Age of Onset   Throat cancer Brother     Social History   Socioeconomic History   Marital status: Widowed    Spouse name: Not on file   Number of children: Not on file   Years of education: Not on file   Highest education level: Not on file  Occupational History   Not on file  Tobacco Use   Smoking status: Every Day     Packs/day: 1.00    Years: 20.00    Pack years: 20.00    Types: Cigarettes    Last attempt to quit: 01/03/2022    Years since quitting: 0.1   Smokeless tobacco: Never   Tobacco comments:    Request to MD for Nicotine patch to help him quit smoking.  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 Lives in [place] self. Son passed in 2021;MI wife passed away in 1990s/aneurysm; daughter in [place].    Social Determinants of Health   Financial Resource Strain: Not on file  Food Insecurity: Not on file  Transportation Needs: Not on file  Physical Activity: Not on file  Stress: Not on file  Social Connections: Not on file    Review of Systems: A 12 point ROS discussed and pertinent positives are indicated in the HPI above.  All other systems are negative.  Review of Systems  Constitutional:  Negative for appetite change and fatigue.  Respiratory:  Negative for cough and shortness of breath.   Cardiovascular:  Negative for chest pain and leg swelling.  Gastrointestinal:  Negative for abdominal pain, diarrhea, nausea and vomiting.  Neurological:  Negative for dizziness and headaches.   Vital Signs: BP (!) 160/131   Temp 98.3 F (36.8 C) (Oral)   Resp (!) 27   Ht _0  (1.753 m)   Wt 205 lb (93 kg)   SpO2 93%   BMI 30.27 kg/m   Physical Exam Constitutional:      General: He is not in acute distress.    Appearance: He is not ill-appearing.  HENT:     Mouth/Throat:     Mouth: Mucous membranes are moist.     Pharynx: Oropharynx is clear.  Cardiovascular:     Rate and Rhythm: Normal rate and regular rhythm.     Pulses: Normal pulses.     Heart sounds: Normal heart sounds.  Pulmonary:     Effort: Pulmonary effort is normal.     Breath sounds: Normal breath  sounds.  Abdominal:     General: Bowel sounds are normal.     Palpations: Abdomen is soft.     Tenderness: There is no abdominal tenderness.  Musculoskeletal:     Right lower leg: No edema.     Left lower leg: No edema.  Skin:    General: Skin is warm and dry.  Neurological:     Mental Status: He is alert and oriented to person, place, and time.    Imaging: X-ray chest PA or AP  Result Date: 01/23/2022 CLINICAL DATA:  Status post bronchoscopy and biopsy. EXAM: CHEST  1 VIEW COMPARISON:  CT chest from 2 days ago. Chest x-ray dated November 22, 2021. FINDINGS: The heart size and mediastinal contours are within normal limits. Normal pulmonary vascularity. Small ill-defined nodular opacities at both lung bases and in the left parahilar upper lobe are not significantly changed. Grossly unchanged cavitary mass in the right upper lobe. No pleural effusion or pneumothorax. No acute osseous abnormality. IMPRESSION: 1. No pneumothorax status post bronchoscopy. 2. Unchanged cavitary mass in the right upper lobe. 3. Unchanged ill-defined nodular opacities at both lung bases and in the left perihilar upper lobe. Electronically Signed   By: Titus Dubin M.D.   On: 01/23/2022 15:40   MR Brain W Wo Contrast  Result Date: 02/05/2022 CLINICAL DATA:  Metastatic disease evaluation. EXAM: MRI HEAD WITHOUT AND WITH CONTRAST TECHNIQUE: Multiplanar, multiecho pulse sequences of the brain and surrounding structures were obtained without and with intravenous contrast. CONTRAST:  25m GADAVIST GADOBUTROL 1 MMOL/ML IV SOLN COMPARISON:  05/01/2011 FINDINGS: Brain: No restricted diffusion to suggest acute or subacute infarct. No acute hemorrhage, mass, mass effect, or midline shift. No hydrocephalus or extra-axial collection. No abnormal parenchymal or meningeal enhancement. Scattered T2 hyperintense signal in the periventricular white matter, likely the sequela of minimal chronic small vessel ischemic disease. Normal  pituitary. Normal craniocervical junction. Vascular: Patent arterial flow voids. Skull and upper cervical spine: Normal marrow signal. Sinuses/Orbits: Mild mucosal thickening. Dysconjugate gaze. The orbits are otherwise unremarkable. Other: The mastoids are well aerated. IMPRESSION: No acute intracranial process. No evidence of metastatic disease in the brain. Electronically Signed   By: AMerilyn BabaM.D.   On: 02/05/2022 03:51   NM PET Image Initial (PI) Skull Base To Thigh (F-18 FDG)  Result Date: 02/08/2022 CLINICAL DATA:  Initial treatment strategy for lung mass. EXAM: NUCLEAR MEDICINE PET SKULL BASE TO THIGH TECHNIQUE: mCi F-18 FDG was injected intravenously. Full-ring PET imaging was performed from the skull base to thigh after the radiotracer. CT data was obtained and used for attenuation correction and anatomic localization. Fasting blood glucose:  mg/dl COMPARISON:  Chest CT dated May eighth 2023 FINDINGS: Mediastinal blood pool activity: SUV max 2.6 Liver activity: SUV max 3.0 NECK: No hypermetabolic lymph nodes in the neck. Incidental CT findings: none CHEST: Hypermetabolic cavitary mass of the right upper lobe measuring 3.3 cm, SUV max of 28.6. Hypermetabolic left hilar mass versus conglomerate hilar lymphadenopathy measuring approximally 4.4 x 2.3 cm, SUV max of 14.3. Peribronchovascular nodular opacities of the of the bilateral lower lobes and left upper lobe are similar prior exam and demonstrate no significant FDG uptake. Incidental CT findings: Left main and three-vessel coronary artery calcifications. Atherosclerotic disease of the thoracic aorta. Centrilobular emphysema. ABDOMEN/PELVIS: No abnormal hypermetabolic activity within the liver, pancreas, adrenal glands, or spleen. No hypermetabolic lymph nodes in the abdomen or pelvis. Incidental CT findings: Bilateral simple appearing renal cysts with no FDG activity, no further follow-up imaging is recommended for these lesions. Nonobstructing  right renal stone. Wall thickening of the bladder. Atherosclerotic disease of the abdominal aorta. Mildly enlarged left external iliac lymph node measuring 1.2 cm in short axis on series 2, image 195 with no significant FDG uptake, SUV max of 1.8 SKELETON: No focal hypermetabolic activity to suggest skeletal metastasis. Incidental CT findings: Focal soft tissue FDG uptake adjacent to the right humerus, likely due to tendinitis. IMPRESSION: 1. Hypermetabolic cavitary mass of the right upper lobe, compatible with primary lung malignancy. 2. Hypermetabolic left hilar mass versus conglomerate hilar lymphadenopathy, possibly due to nodal metastatic disease or primary lung malignancy. 3. Peribronchovascular nodular opacities of the of the  bilateral lower lobes and left upper lobe are similar prior exam and demonstrate no significant FDG, potentially infectious. Recommend attention on follow-up. 4. Mildly enlarged left external iliac lymph node with no significant FDG uptake, likely reactive. Recommend attention on follow-up. 5. No evidence of distant metastatic disease in the abdomen or pelvis. Electronically Signed   By: Yetta Glassman M.D.   On: 02/08/2022 09:29   DG C-Arm 1-60 Min-No Report  Result Date: 01/23/2022 Fluoroscopy was utilized by the requesting physician.  No radiographic interpretation.   CT Super D Chest Wo Contrast  Result Date: 01/21/2022 CLINICAL DATA:  Navigational biopsy planning, cavitary lung lesion, emphysema EXAM: CT CHEST WITHOUT CONTRAST TECHNIQUE: Multidetector CT imaging of the chest was performed using thin slice collimation for electromagnetic bronchoscopy planning purposes, without intravenous contrast. RADIATION DOSE REDUCTION: This exam was performed according to the departmental dose-optimization program which includes automated exposure control, adjustment of the mA and/or kV according to patient size and/or use of iterative reconstruction technique. COMPARISON:  None  Available. FINDINGS: Cardiovascular: Aortic atherosclerosis. Three-vessel coronary artery calcifications. Normal heart size. No pericardial effusion. Mediastinum/Nodes: Although evaluation is limited by lack of intravenous contrast, suspect significant left hilar lymphadenopathy or mass, with apparent obstruction of the left upper lobe bronchus and severe narrowing of the left lower lobe bronchus (series 2, image 30). There may be additional right hilar lymphadenopathy, again assessment limited (series 2, image 30). Thyroid gland, trachea, and esophagus demonstrate no significant findings. Lungs/Pleura: Moderate to severe centrilobular emphysema. Thick-walled cavitary lesion of the posterior right upper lobe abutting the major fissure measuring 3.3 x 3.3 cm (series 3, image 14). Scattered heterogeneous and ground-glass airspace opacity throughout the upper lobes and right lower lobe (series 3, image 35, 46). No pleural effusion or pneumothorax. Upper Abdomen: No acute abnormality. Musculoskeletal: No chest wall abnormality. No suspicious osseous lesions identified. IMPRESSION: 1. Thick-walled cavitary lesion of the posterior right upper lobe abutting the major fissure measuring 3.3 x 3.3 cm, highly concerning for primary lung malignancy or metastasis. 2. Although evaluation is limited by lack of intravenous contrast, suspect significant left hilar lymphadenopathy or mass, with apparent obstruction of the left upper lobe bronchus and severe narrowing of the left lower lobe bronchus. This finding is likewise highly concerning for primary lung malignancy or alternately nodal metastasis. Contrast enhanced CT may be helpful to more clearly evaluate the hilum. 3. There may be additional right hilar lymphadenopathy, again assessment limited by noncontrast CT. 4. Scattered heterogeneous and ground-glass airspace opacity throughout the upper lobes and right lower lobe, possibly reflecting postobstructive airspace disease in  the left lung generally nonspecific. 5. Emphysema. 6. Coronary artery disease. Aortic Atherosclerosis (ICD10-I70.0) and Emphysema (ICD10-J43.9). Electronically Signed   By: Delanna Ahmadi M.D.   On: 01/21/2022 11:58    Labs:  CBC: Recent Labs    11/22/21 1702 11/23/21 0459 01/21/22 1453 02/01/22 1011  WBC 13.1* 12.5* 7.0 5.1  HGB 11.8* 14.4 15.2 15.7  HCT 39.7 46.7 49.4 50.6  PLT 196 257 231 234    COAGS: Recent Labs    01/21/22 1453  INR 1.1  APTT 30    BMP: Recent Labs    11/22/21 1702 02/01/22 1011  NA 136 137  K 3.8 4.2  CL 99 100  CO2 26 30  GLUCOSE 111* 107*  BUN 13 9  CALCIUM 8.9 9.0  CREATININE 0.90 0.85  GFRNONAA >60 >60    LIVER FUNCTION TESTS: Recent Labs    02/01/22 1011  BILITOT 0.6  AST 15  ALT 13  ALKPHOS 89  PROT 7.7  ALBUMIN 4.0    TUMOR MARKERS: No results for input(s): AFPTM, CEA, CA199, CHROMGRNA in the last 8760 hours.  Assessment and Plan:  Bilateral lung cancer; pending chemotherapy: Giankarlo L. Mackie, 71 year old male, presents today to the [facility] Interventional Radiology department for an image-guided port-a-catheter placement.  Risks and benefits of image-guided port-a-catheter placement were discussed with the patient including, but not limited to bleeding, infection, pneumothorax, or fibrin sheath development and need for additional procedures.  All of the patient's questions were answered, patient is agreeable to proceed. He has been NPO.   Consent signed and in chart.  Thank you for this interesting consult.  I greatly enjoyed meeting ELISON WORREL and look forward to participating in their care.  A copy of this report was sent to the requesting provider on this date.  Electronically Signed: Soyla Dryer, AGACNP-BC [phone] 02/12/2022, 11:19 AM   I spent a total of  30 Minutes   in face to face in clinical consultation, greater than 50% of which was counseling/coordinating care  for port-a-catheter placement.

## 2022-02-12 NOTE — Procedures (Signed)
Interventional Radiology Procedure Note  Procedure: Placement of a right IJ approach single lumen PowerPort.  Tip is positioned at the superior cavoatrial junction and catheter is ready for immediate use.  Complications: No immediate Recommendations:  - Ok to shower tomorrow - Do not submerge for 7 days - Routine line care   Signed,  Caoimhe Damron K. Alexyss Balzarini, MD   

## 2022-02-13 ENCOUNTER — Encounter: Payer: Self-pay | Admitting: Internal Medicine

## 2022-02-13 ENCOUNTER — Encounter: Payer: Self-pay | Admitting: *Deleted

## 2022-02-14 DIAGNOSIS — C3412 Malignant neoplasm of upper lobe, left bronchus or lung: Secondary | ICD-10-CM | POA: Insufficient documentation

## 2022-02-14 DIAGNOSIS — C3402 Malignant neoplasm of left main bronchus: Secondary | ICD-10-CM | POA: Insufficient documentation

## 2022-02-18 MED FILL — Dexamethasone Sodium Phosphate Inj 100 MG/10ML: INTRAMUSCULAR | Qty: 1 | Status: AC

## 2022-02-19 ENCOUNTER — Ambulatory Visit: Admission: RE | Admit: 2022-02-19 | Payer: Medicare Other | Source: Ambulatory Visit

## 2022-02-19 ENCOUNTER — Inpatient Hospital Stay (HOSPITAL_BASED_OUTPATIENT_CLINIC_OR_DEPARTMENT_OTHER): Payer: Medicare Other | Admitting: Internal Medicine

## 2022-02-19 ENCOUNTER — Inpatient Hospital Stay: Payer: Medicare Other

## 2022-02-19 ENCOUNTER — Encounter: Payer: Self-pay | Admitting: *Deleted

## 2022-02-19 ENCOUNTER — Ambulatory Visit: Payer: Medicare Other

## 2022-02-19 ENCOUNTER — Encounter: Payer: Self-pay | Admitting: Internal Medicine

## 2022-02-19 VITALS — BP 166/81 | HR 66 | Resp 18

## 2022-02-19 DIAGNOSIS — C3411 Malignant neoplasm of upper lobe, right bronchus or lung: Secondary | ICD-10-CM

## 2022-02-19 DIAGNOSIS — C3402 Malignant neoplasm of left main bronchus: Secondary | ICD-10-CM | POA: Insufficient documentation

## 2022-02-19 DIAGNOSIS — Z5111 Encounter for antineoplastic chemotherapy: Secondary | ICD-10-CM | POA: Insufficient documentation

## 2022-02-19 DIAGNOSIS — Z79899 Other long term (current) drug therapy: Secondary | ICD-10-CM | POA: Insufficient documentation

## 2022-02-19 LAB — CBC WITH DIFFERENTIAL/PLATELET
Abs Immature Granulocytes: 0.01 10*3/uL (ref 0.00–0.07)
Basophils Absolute: 0 10*3/uL (ref 0.0–0.1)
Basophils Relative: 1 %
Eosinophils Absolute: 0.3 10*3/uL (ref 0.0–0.5)
Eosinophils Relative: 6 %
HCT: 48.1 % (ref 39.0–52.0)
Hemoglobin: 14.9 g/dL (ref 13.0–17.0)
Immature Granulocytes: 0 %
Lymphocytes Relative: 34 %
Lymphs Abs: 1.7 10*3/uL (ref 0.7–4.0)
MCH: 29.2 pg (ref 26.0–34.0)
MCHC: 31 g/dL (ref 30.0–36.0)
MCV: 94.1 fL (ref 80.0–100.0)
Monocytes Absolute: 0.6 10*3/uL (ref 0.1–1.0)
Monocytes Relative: 12 %
Neutro Abs: 2.4 10*3/uL (ref 1.7–7.7)
Neutrophils Relative %: 47 %
Platelets: 206 10*3/uL (ref 150–400)
RBC: 5.11 MIL/uL (ref 4.22–5.81)
RDW: 13 % (ref 11.5–15.5)
WBC: 5.1 10*3/uL (ref 4.0–10.5)
nRBC: 0 % (ref 0.0–0.2)

## 2022-02-19 LAB — COMPREHENSIVE METABOLIC PANEL
ALT: 14 U/L (ref 0–44)
AST: 15 U/L (ref 15–41)
Albumin: 3.9 g/dL (ref 3.5–5.0)
Alkaline Phosphatase: 95 U/L (ref 38–126)
Anion gap: 8 (ref 5–15)
BUN: 7 mg/dL — ABNORMAL LOW (ref 8–23)
CO2: 31 mmol/L (ref 22–32)
Calcium: 8.6 mg/dL — ABNORMAL LOW (ref 8.9–10.3)
Chloride: 100 mmol/L (ref 98–111)
Creatinine, Ser: 0.79 mg/dL (ref 0.61–1.24)
GFR, Estimated: >60 mL/min (ref >60)
Glucose, Bld: 109 mg/dL — ABNORMAL HIGH (ref 70–99)
Potassium: 3.6 mmol/L (ref 3.5–5.1)
Sodium: 139 mmol/L (ref 135–145)
Total Bilirubin: 0.5 mg/dL (ref 0.3–1.2)
Total Protein: 7.3 g/dL (ref 6.5–8.1)

## 2022-02-19 MED ORDER — SODIUM CHLORIDE 0.9 % IV SOLN
Freq: Once | INTRAVENOUS | Status: AC
  Filled 2022-02-19: qty 250

## 2022-02-19 MED ORDER — HEPARIN SOD (PORK) LOCK FLUSH 100 UNIT/ML IV SOLN
500.0000 [IU] | Freq: Once | INTRAVENOUS | Status: AC | PRN
  Administered 2022-02-19: 500 [IU]
  Filled 2022-02-19: qty 5

## 2022-02-19 MED ORDER — NICOTINE 21-14-7 MG/24HR TD KIT: 1.0000 | PACK | Freq: Every day | TRANSDERMAL | 1 refills | Status: AC

## 2022-02-19 MED ORDER — BLOOD GLUCOSE MONITOR KIT: PACK | 0 refills | Status: AC

## 2022-02-19 MED ORDER — SODIUM CHLORIDE 0.9 % IV SOLN
10.0000 mg | Freq: Once | INTRAVENOUS | Status: AC
  Administered 2022-02-19: 10 mg via INTRAVENOUS
  Filled 2022-02-19: qty 10

## 2022-02-19 MED ORDER — SODIUM CHLORIDE 0.9 % IV SOLN
231.6000 mg | Freq: Once | INTRAVENOUS | Status: AC
  Administered 2022-02-19: 230 mg via INTRAVENOUS
  Filled 2022-02-19: qty 23

## 2022-02-19 MED ORDER — SODIUM CHLORIDE 0.9 % IV SOLN
45.0000 mg/m2 | Freq: Once | INTRAVENOUS | Status: AC
  Administered 2022-02-19: 96 mg via INTRAVENOUS
  Filled 2022-02-19: qty 16

## 2022-02-19 MED ORDER — FAMOTIDINE IN NACL 20-0.9 MG/50ML-% IV SOLN
20.0000 mg | Freq: Once | INTRAVENOUS | Status: AC
  Administered 2022-02-19: 20 mg via INTRAVENOUS
  Filled 2022-02-19: qty 50

## 2022-02-19 MED ORDER — DIPHENHYDRAMINE HCL 50 MG/ML IJ SOLN
50.0000 mg | Freq: Once | INTRAMUSCULAR | Status: AC
  Administered 2022-02-19: 50 mg via INTRAVENOUS
  Filled 2022-02-19: qty 1

## 2022-02-19 MED ORDER — PALONOSETRON HCL INJECTION 0.25 MG/5ML
0.2500 mg | Freq: Once | INTRAVENOUS | Status: AC
  Administered 2022-02-19: 0.25 mg via INTRAVENOUS
  Filled 2022-02-19: qty 5

## 2022-02-19 MED FILL — Dexamethasone Sodium Phosphate Inj 100 MG/10ML: INTRAMUSCULAR | Qty: 1 | Status: AC

## 2022-02-19 NOTE — Assessment & Plan Note (Signed)
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  PET MAY 16OH- Hypermetabolic cavitary mass of the right upper lobe; Hypermetabolic left hilar mass versus conglomerate hilar lymphadenopathy, possibly due to nodal metastatic disease or primary lung malignancy.S/p  discussion at tumor conference on May 25. Pending-r NGS-F-ONE.  Planned chemoradiation-CarboTaxol weekly; followed by adjuvant immunotherapy.  Post chemoradiation plan SBRT-right upper lobe mass.  #Proceed with CarboTaxol weekly chemotherapy. Labs today reviewed;  acceptable for treatment today.   # Smoking: Active smoker; quit smoking recently. On  nicotine patches.  # COPD: Stable- on proair- continue inhalers as per pulmonary.  #Diabetes-monitor closely on chemotherapy. Recommend BG/alncets; strips 2 or 3 times a day  #Handicapped sticker: signed.   # DISPOSITION: # chemo today- carboTaxol weekly.  # in 1 week-labs- cbc/cmp; carbo-taxol # Follow up in 2 weeks- MD- labs- cbc/cmp; carbo-taxol weekly- Dr.B

## 2022-02-19 NOTE — Progress Notes (Signed)
Met with patient during follow up visit with Dr. Rogue Bussing before starting chemotherapy. All questions answered during visit. Pt informed will be given follow up appts prior to leaving today. Pt informed that will need to check blood sugars three times a day per recommendation from Dr. Jacinto Reap. Reviewed instructions on how to use glucometer with patient and given written instructions. Pt instructed to keep log of blood sugar readings and blank log given to patient. Instructed to call with any questions or needs. Pt verbalized understanding.

## 2022-02-19 NOTE — Patient Instructions (Signed)
[facility] CANCER CTR AT [place]  Discharge Instructions: Thank you for choosing [facility] to provide your oncology and hematology care.  If you have a lab appointment with the [facility], please go directly to the [facility] and check in at the registration area.  Wear comfortable clothing and clothing appropriate for easy access to any Portacath or PICC line.   We strive to give you quality time with your provider. You may need to reschedule your appointment if you arrive late (15 or more minutes).  Arriving late affects you and other patients whose appointments are after yours.  Also, if you miss three or more appointments without notifying the office, you may be dismissed from the clinic at the provider's discretion.      For prescription refill requests, have your pharmacy contact our office and allow 72 hours for refills to be completed.    Today you received the following chemotherapy and/or immunotherapy agents CARBOPLATIN and TAXOL       To help prevent nausea and vomiting after your treatment, we encourage you to take your nausea medication as directed.  BELOW ARE SYMPTOMS THAT SHOULD BE REPORTED IMMEDIATELY: *FEVER GREATER THAN 100.4 F (38 C) OR HIGHER *CHILLS OR SWEATING *NAUSEA AND VOMITING THAT IS NOT CONTROLLED WITH YOUR NAUSEA MEDICATION *UNUSUAL SHORTNESS OF BREATH *UNUSUAL BRUISING OR BLEEDING *URINARY PROBLEMS (pain or burning when urinating, or frequent urination) *BOWEL PROBLEMS (unusual diarrhea, constipation, pain near the anus) TENDERNESS IN MOUTH AND THROAT WITH OR WITHOUT PRESENCE OF ULCERS (sore throat, sores in mouth, or a toothache) UNUSUAL RASH, SWELLING OR PAIN  UNUSUAL VAGINAL DISCHARGE OR ITCHING   Items with * indicate a potential emergency and should be followed up as soon as possible or go to the Emergency Department if any problems should occur.  Please show the CHEMOTHERAPY ALERT CARD or IMMUNOTHERAPY ALERT CARD at  check-in to the Emergency Department and triage nurse.  Should you have questions after your visit or need to cancel or reschedule your appointment, please contact [facility] CANCER [place] AT [place]  [phone] and follow the prompts.  Office hours are 8:00 a.m. to 4:30 p.m. Monday - Friday. Please note that voicemails left after 4:00 p.m. may not be returned until the following business day.  We are closed weekends and major holidays. You have access to a nurse at all times for urgent questions. Please call the main number to the clinic [phone] and follow the prompts.  For any non-urgent questions, you may also contact your provider using MyChart. We now offer e-Visits for anyone 7 and older to request care online for non-urgent symptoms. For details visit [email or web address].   Also download the MyChart app! Go to the app store, search "MyChart", open the app, select [facility], and log in with your MyChart username and password.  Due to Covid, a mask is required upon entering the hospital/clinic. If you do not have a mask, one will be given to you upon arrival. For doctor visits, patients may have 1 support person aged 2 or older with them. For treatment visits, patients cannot have anyone with them due to current Covid guidelines and our immunocompromised population.   Carboplatin injection What is this medication? CARBOPLATIN (KAR boe pla tin) is a chemotherapy drug. It targets fast dividing cells, like cancer cells, and causes these cells to die. This medicine is used to treat ovarian cancer and many other cancers. This medicine may be used for other purposes; ask your health  care provider or pharmacist if you have questions. COMMON BRAND NAME(S): Paraplatin What should I tell my care team before I take this medication? They need to know if you have any of these conditions: blood disorders hearing problems kidney disease recent or ongoing radiation therapy an  unusual or allergic reaction to carboplatin, cisplatin, other chemotherapy, other medicines, foods, dyes, or preservatives pregnant or trying to get pregnant breast-feeding How should I use this medication? This drug is usually given as an infusion into a vein. It is administered in a hospital or clinic by a specially trained health care professional. Talk to your pediatrician regarding the use of this medicine in children. Special care may be needed. Overdosage: If you think you have taken too much of this medicine contact a poison control center or emergency room at once. NOTE: This medicine is only for you. Do not share this medicine with others. What if I miss a dose? It is important not to miss a dose. Call your doctor or health care professional if you are unable to keep an appointment. What may interact with this medication? medicines for seizures medicines to increase blood counts like filgrastim, pegfilgrastim, sargramostim some antibiotics like amikacin, gentamicin, neomycin, streptomycin, tobramycin vaccines Talk to your doctor or health care professional before taking any of these medicines: acetaminophen aspirin ibuprofen ketoprofen naproxen This list may not describe all possible interactions. Give your health care provider a list of all the medicines, herbs, non-prescription drugs, or dietary supplements you use. Also tell them if you smoke, drink alcohol, or use illegal drugs. Some items may interact with your medicine. What should I watch for while using this medication? Your condition will be monitored carefully while you are receiving this medicine. You will need important blood work done while you are taking this medicine. This drug may make you feel generally unwell. This is not uncommon, as chemotherapy can affect healthy cells as well as cancer cells. Report any side effects. Continue your course of treatment even though you feel ill unless your doctor tells you to  stop. In some cases, you may be given additional medicines to help with side effects. Follow all directions for their use. Call your doctor or health care professional for advice if you get a fever, chills or sore throat, or other symptoms of a cold or flu. Do not treat yourself. This drug decreases your body's ability to fight infections. Try to avoid being around people who are sick. This medicine may increase your risk to bruise or bleed. Call your doctor or health care professional if you notice any unusual bleeding. Be careful brushing and flossing your teeth or using a toothpick because you may get an infection or bleed more easily. If you have any dental work done, tell your dentist you are receiving this medicine. Avoid taking products that contain aspirin, acetaminophen, ibuprofen, naproxen, or ketoprofen unless instructed by your doctor. These medicines may hide a fever. Do not become pregnant while taking this medicine. Women should inform their doctor if they wish to become pregnant or think they might be pregnant. There is a potential for serious side effects to an unborn child. Talk to your health care professional or pharmacist for more information. Do not breast-feed an infant while taking this medicine. What side effects may I notice from receiving this medication? Side effects that you should report to your doctor or health care professional as soon as possible: allergic reactions like skin rash, itching or hives, swelling of the face,  lips, or tongue signs of infection - fever or chills, cough, sore throat, pain or difficulty passing urine signs of decreased platelets or bleeding - bruising, pinpoint red spots on the skin, black, tarry stools, nosebleeds signs of decreased red blood cells - unusually weak or tired, fainting spells, lightheadedness breathing problems changes in hearing changes in vision chest pain high blood pressure low blood counts - This drug may decrease the  number of white blood cells, red blood cells and platelets. You may be at increased risk for infections and bleeding. nausea and vomiting pain, swelling, redness or irritation at the injection site pain, tingling, numbness in the hands or feet problems with balance, talking, walking trouble passing urine or change in the amount of urine Side effects that usually do not require medical attention (report to your doctor or health care professional if they continue or are bothersome): hair loss loss of appetite metallic taste in the mouth or changes in taste This list may not describe all possible side effects. Call your doctor for medical advice about side effects. You may report side effects to FDA at 1-800-FDA-1088. Where should I keep my medication? This drug is given in a hospital or clinic and will not be stored at home. NOTE: This sheet is a summary. It may not cover all possible information. If you have questions about this medicine, talk to your doctor, pharmacist, or health care provider.  2023 Elsevier/Gold Standard (2008-02-10 00:00:00)  Paclitaxel injection What is this medication? PACLITAXEL (PAK li TAX el) is a chemotherapy drug. It targets fast dividing cells, like cancer cells, and causes these cells to die. This medicine is used to treat ovarian cancer, breast cancer, lung cancer, Kaposi's sarcoma, and other cancers. This medicine may be used for other purposes; ask your health care provider or pharmacist if you have questions. COMMON BRAND NAME(S): Onxol, Taxol What should I tell my care team before I take this medication? They need to know if you have any of these conditions: history of irregular heartbeat liver disease low blood counts, like low white cell, platelet, or red cell counts lung or breathing disease, like asthma tingling of the fingers or toes, or other nerve disorder an unusual or allergic reaction to paclitaxel, alcohol, polyoxyethylated castor oil, other  chemotherapy, other medicines, foods, dyes, or preservatives pregnant or trying to get pregnant breast-feeding How should I use this medication? This drug is given as an infusion into a vein. It is administered in a hospital or clinic by a specially trained health care professional. Talk to your pediatrician regarding the use of this medicine in children. Special care may be needed. Overdosage: If you think you have taken too much of this medicine contact a poison control center or emergency room at once. NOTE: This medicine is only for you. Do not share this medicine with others. What if I miss a dose? It is important not to miss your dose. Call your doctor or health care professional if you are unable to keep an appointment. What may interact with this medication? Do not take this medicine with any of the following medications: live virus vaccines This medicine may also interact with the following medications: antiviral medicines for hepatitis, HIV or AIDS certain antibiotics like erythromycin and clarithromycin certain medicines for fungal infections like ketoconazole and itraconazole certain medicines for seizures like carbamazepine, phenobarbital, phenytoin gemfibrozil nefazodone rifampin St. John's wort This list may not describe all possible interactions. Give your health care provider a list of all the  medicines, herbs, non-prescription drugs, or dietary supplements you use. Also tell them if you smoke, drink alcohol, or use illegal drugs. Some items may interact with your medicine. What should I watch for while using this medication? Your condition will be monitored carefully while you are receiving this medicine. You will need important blood work done while you are taking this medicine. This medicine can cause serious allergic reactions. To reduce your risk you will need to take other medicine(s) before treatment with this medicine. If you experience allergic reactions like skin  rash, itching or hives, swelling of the face, lips, or tongue, tell your doctor or health care professional right away. In some cases, you may be given additional medicines to help with side effects. Follow all directions for their use. This drug may make you feel generally unwell. This is not uncommon, as chemotherapy can affect healthy cells as well as cancer cells. Report any side effects. Continue your course of treatment even though you feel ill unless your doctor tells you to stop. Call your doctor or health care professional for advice if you get a fever, chills or sore throat, or other symptoms of a cold or flu. Do not treat yourself. This drug decreases your body's ability to fight infections. Try to avoid being around people who are sick. This medicine may increase your risk to bruise or bleed. Call your doctor or health care professional if you notice any unusual bleeding. Be careful brushing and flossing your teeth or using a toothpick because you may get an infection or bleed more easily. If you have any dental work done, tell your dentist you are receiving this medicine. Avoid taking products that contain aspirin, acetaminophen, ibuprofen, naproxen, or ketoprofen unless instructed by your doctor. These medicines may hide a fever. Do not become pregnant while taking this medicine. Women should inform their doctor if they wish to become pregnant or think they might be pregnant. There is a potential for serious side effects to an unborn child. Talk to your health care professional or pharmacist for more information. Do not breast-feed an infant while taking this medicine. Men are advised not to father a child while receiving this medicine. This product may contain alcohol. Ask your pharmacist or healthcare provider if this medicine contains alcohol. Be sure to tell all healthcare providers you are taking this medicine. Certain medicines, like metronidazole and disulfiram, can cause an unpleasant  reaction when taken with alcohol. The reaction includes flushing, headache, nausea, vomiting, sweating, and increased thirst. The reaction can last from 30 minutes to several hours. What side effects may I notice from receiving this medication? Side effects that you should report to your doctor or health care professional as soon as possible: allergic reactions like skin rash, itching or hives, swelling of the face, lips, or tongue breathing problems changes in vision fast, irregular heartbeat high or low blood pressure mouth sores pain, tingling, numbness in the hands or feet signs of decreased platelets or bleeding - bruising, pinpoint red spots on the skin, black, tarry stools, blood in the urine signs of decreased red blood cells - unusually weak or tired, feeling faint or lightheaded, falls signs of infection - fever or chills, cough, sore throat, pain or difficulty passing urine signs and symptoms of liver injury like dark yellow or brown urine; general ill feeling or flu-like symptoms; light-colored stools; loss of appetite; nausea; right upper belly pain; unusually weak or tired; yellowing of the eyes or skin swelling of the ankles, feet,  hands unusually slow heartbeat Side effects that usually do not require medical attention (report to your doctor or health care professional if they continue or are bothersome): diarrhea hair loss loss of appetite muscle or joint pain nausea, vomiting pain, redness, or irritation at site where injected tiredness This list may not describe all possible side effects. Call your doctor for medical advice about side effects. You may report side effects to FDA at 1-800-FDA-1088. Where should I keep my medication? This drug is given in a hospital or clinic and will not be stored at home. NOTE: This sheet is a summary. It may not cover all possible information. If you have questions about this medicine, talk to your doctor, pharmacist, or health care  provider.  2023 Elsevier/Gold Standard (2021-08-03 00:00:00)

## 2022-02-19 NOTE — Progress Notes (Signed)
C/o constipation, can you recommend something, using miralax occasionally.  Noticed some swelling in left ankle this morning.

## 2022-02-19 NOTE — Addendum Note (Signed)
Addended by: Telford Nab on: 02/19/2022 11:34 AM   Modules accepted: Orders

## 2022-02-19 NOTE — Progress Notes (Signed)
[facility] NOTE  Patient Care Team: [place] as PCP - General Telford Nab, RN as Oncology Nurse Navigator Cammie Sickle, MD as Consulting Physician (Oncology)  CHIEF COMPLAINTS/PURPOSE OF CONSULTATION: lung cancer  #  Oncology History Overview Note  IMPRESSION: 1. Thick-walled cavitary lesion of the posterior right upper lobe abutting the major fissure measuring 3.3 x 3.3 cm, highly concerning for primary lung malignancy or metastasis. 2. Although evaluation is limited by lack of intravenous contrast, suspect significant left hilar lymphadenopathy or mass, with apparent obstruction of the left upper lobe bronchus and severe narrowing of the left lower lobe bronchus. This finding is likewise highly concerning for primary lung malignancy or alternately nodal metastasis. Contrast enhanced CT may be helpful to more clearly evaluate the hilum. 3. There may be additional right hilar lymphadenopathy, again assessment limited by noncontrast CT. 4. Scattered heterogeneous and ground-glass airspace opacity throughout the upper lobes and right lower lobe, possibly reflecting postobstructive airspace disease in the left lung generally nonspecific. 5. Emphysema. 6. Coronary artery disease.   Aortic Atherosclerosis (ICD10-I70.0) and Emphysema (ICD10-J43.9).     Electronically Signed   By: Delanna Ahmadi M.D.   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 [Dr.Fleming/Dr.Aleskerov]  # LEFT HILAR- June 6th, 2023-  chemo-RT- plan SBRT of RUL nodule  # CAD; COPD [Dr.Fleming- O2 none now]; DM; HTN   Malignant neoplasm of upper lobe of right lung ([place])  02/01/2022 Initial Diagnosis   Malignant neoplasm of upper lobe of right lung ([place])   02/01/2022 Cancer Staging   Staging form: Lung, AJCC 8th Edition - Clinical: Stage IIIB (cT2a, cN3, cM0) - Signed by Cammie Sickle, MD on 02/01/2022    02/19/2022 -   Chemotherapy   Patient is on Treatment Plan : LUNG Carboplatin / Paclitaxel + XRT q7d         HISTORY OF PRESENTING ILLNESS: Alone.  Ambulating independently.  Louis Sullivan 71 y.o.  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  In the interim patient underwent Mediport placement.  S/p evaluation with radiation oncology.  Status postchemotherapy education.  Patient denies any worsening shortness of breath or cough. Patient has chronic mild shortness of breath.  Chronic mild cough not any worse.  He has been using his inhalers ProAir.  Denies any headaches or vision changes.  Review of Systems  Constitutional:  Negative for chills, diaphoresis, fever, malaise/fatigue and weight loss.  HENT:  Negative for nosebleeds and sore throat.   Eyes:  Negative for double vision.  Respiratory:  Positive for cough and shortness of breath. Negative for hemoptysis, sputum production and wheezing.   Cardiovascular:  Negative for chest pain, palpitations, orthopnea and leg swelling.  Gastrointestinal:  Negative for abdominal pain, blood in stool, constipation, diarrhea, heartburn, melena, nausea and vomiting.  Genitourinary:  Negative for dysuria, frequency and urgency.  Musculoskeletal:  Positive for joint pain. Negative for back pain.  Skin: Negative.  Negative for itching and rash.  Neurological:  Negative for dizziness, tingling, focal weakness, weakness and headaches.  Endo/Heme/Allergies:  Does not bruise/bleed easily.  Psychiatric/Behavioral:  Negative for depression. The patient is not nervous/anxious and does not have insomnia.     MEDICAL HISTORY:  Past Medical History:  Diagnosis Date   COPD (chronic obstructive pulmonary disease) ([place])    Coronary artery disease    1 stent placed in  about 2008   Diabetes mellitus without complication ([place])    Dyspnea    Headache    migraines   Hyperlipidemia    Hypertension     Pneumonia 12/2021   hospitalized at [facility]: Past Surgical History:  Procedure Laterality Date   CARDIAC CATHETERIZATION     COLONOSCOPY     IR IMAGING GUIDED PORT INSERTION  02/12/2022   VIDEO BRONCHOSCOPY WITH ENDOBRONCHIAL NAVIGATION N/A 01/23/2022   Procedure: VIDEO BRONCHOSCOPY WITH ENDOBRONCHIAL NAVIGATION;  Surgeon: Ottie Glazier, MD;  Location: [facility] ORS;  Service: Thoracic;  Laterality: N/A;   VIDEO BRONCHOSCOPY WITH ENDOBRONCHIAL ULTRASOUND N/A 01/23/2022   Procedure: VIDEO BRONCHOSCOPY WITH ENDOBRONCHIAL ULTRASOUND;  Surgeon: Ottie Glazier, MD;  Location: [facility] ORS;  Service: Thoracic;  Laterality: N/A;    SOCIAL HISTORY: Social History   Socioeconomic History   Marital status: Widowed    Spouse name: Not on file   Number of children: Not on file   Years of education: Not on file   Highest education level: Not on file  Occupational History   Not on file  Tobacco Use   Smoking status: Former    Packs/day: 1.00    Years: 20.00    Pack years: 20.00    Types: Cigarettes    Quit date: 02/11/2022    Years since quitting: 0.0   Smokeless tobacco: Never   Tobacco comments:    Request to MD for Nicotine patch to help him quit smoking.  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 Lives in [place] self. Son passed in 2021;MI wife passed away in 1990s/aneurysm; daughter in [place].    Social Determinants of Health   Financial Resource Strain: Not on file  Food Insecurity: Not on file  Transportation Needs: Not on file  Physical Activity: Not on file  Stress: Not on file  Social Connections: Not on file  Intimate Partner Violence: Not on file    FAMILY HISTORY: Family History  Problem Relation Age of Onset   Throat cancer  Brother     ALLERGIES:  has No Known Allergies.  MEDICATIONS:  Current Outpatient Medications  Medication Sig Dispense Refill   albuterol (PROVENTIL) (5 MG/ML) 0.5% nebulizer solution Take 2.5 mg by nebulization every morning.     albuterol (VENTOLIN HFA) 108 (90 Base) MCG/ACT inhaler Inhale 2 puffs into the lungs every 4 (four) hours as needed for wheezing or shortness of breath.     aspirin EC 81 MG tablet Take 81 mg by mouth daily.     atorvastatin (LIPITOR) 40 MG tablet Take 40 mg by mouth every morning.     Cholecalciferol (VITAMIN D3 PO) Take 1 tablet by mouth daily at 6 (six) AM.     lidocaine-prilocaine (EMLA) cream Apply on the port. 30 -45 min  prior to port access. 30 g 3   losartan (COZAAR) 100 MG tablet Take 100 mg by mouth every evening.     losartan (COZAAR) 50 MG tablet Take 50 mg by mouth in the morning.     metFORMIN (GLUCOPHAGE) 1000 MG tablet Take 1,000 mg by mouth daily with breakfast.     ondansetron (ZOFRAN) 8 MG tablet One pill every 8 hours as needed for nausea/vomitting. [place]  tablet 1   OXYGEN Inhale 2 L into the lungs at bedtime.     prochlorperazine (COMPAZINE) 10 MG tablet Take 1 tablet (10 mg total) by mouth every 6 (six) hours as needed for nausea or vomiting. 40 tablet 1   Nicotine 21-14-7 MG/24HR KIT Place 1 patch onto the skin daily. 60 kit 1   traZODone (DESYREL) 50 MG tablet Take 50 mg by mouth at bedtime. (Patient not taking: Reported on 02/19/2022)     No current facility-administered medications for this visit.   Facility-Administered Medications Ordered in Other Visits  Medication Dose Route Frequency Provider Last Rate Last Admin   0.9 %  sodium chloride infusion   Intravenous Once Cammie Sickle, MD       CARBOplatin (PARAPLATIN) 230 mg in sodium chloride 0.9 % 100 mL chemo infusion  230 mg Intravenous Once Cammie Sickle, MD       dexamethasone (DECADRON) 10 mg in sodium chloride 0.9 % 50 mL IVPB  10 mg Intravenous Once Cammie Sickle, MD       diphenhydrAMINE (BENADRYL) injection 50 mg  50 mg Intravenous Once Cammie Sickle, MD       famotidine (PEPCID) IVPB 20 mg premix  20 mg Intravenous Once Charlaine Dalton R, MD       heparin lock flush 100 unit/mL  500 Units Intracatheter Once PRN Cammie Sickle, MD       PACLitaxel (TAXOL) 96 mg in sodium chloride 0.9 % 250 mL chemo infusion (</= 66m/m2)  45 mg/m2 (Treatment Plan Recorded) Intravenous Once BCammie Sickle MD       palonosetron (ALOXI) injection 0.25 mg  0.25 mg Intravenous Once BCharlaine DaltonR, MD          .  PHYSICAL EXAMINATION: ECOG PERFORMANCE STATUS: 1 - Symptomatic but completely ambulatory  Vitals:   02/19/22 1007  BP: (!) 166/73  Pulse: 70  Temp: (!) 96.5 F (35.8 C)  SpO2: 96%   Filed Weights   02/19/22 1007  Weight: 206 lb 9.6 oz (93.7 kg)    Physical Exam Vitals and nursing note reviewed.  HENT:     Head: Normocephalic and atraumatic.     Mouth/Throat:     Pharynx: Oropharynx is clear.  Eyes:     Extraocular Movements: Extraocular movements intact.     Pupils: Pupils are equal, round, and reactive to light.  Cardiovascular:     Rate and Rhythm: Normal rate and regular rhythm.  Pulmonary:     Comments: Decreased breath sounds bilaterally.  Abdominal:     Palpations: Abdomen is soft.  Musculoskeletal:        General: Normal range of motion.     Cervical back: Normal range of motion.  Skin:    General: Skin is warm.  Neurological:     General: No focal deficit present.     Mental Status: He is alert and oriented to person, place, and time.  Psychiatric:        Behavior: Behavior normal.        Judgment: Judgment normal.     LABORATORY DATA:  I have reviewed the data as listed Lab Results  Component Value Date   WBC 5.1 02/19/2022   HGB 14.9 02/19/2022   HCT 48.1 02/19/2022   MCV 94.1 02/19/2022   PLT 206 02/19/2022   Recent Labs    11/22/21 1702 02/01/22 1011  02/19/22 1007  NA 136 137 139  K 3.8 4.2 3.6  CL 99 100 100  CO2 26 30 31   GLUCOSE 111* 107* 109*  BUN 13 9 7*  CREATININE 0.90 0.85 0.79  CALCIUM 8.9 9.0 8.6*  GFRNONAA >60 >60 >60  PROT  --  7.7 7.3  ALBUMIN  --  4.0 3.9  AST  --  15 15  ALT  --  13 14  ALKPHOS  --  89 95  BILITOT  --  0.6 0.5    RADIOGRAPHIC STUDIES: I have personally reviewed the radiological images as listed and agreed with the findings in the report. X-ray chest PA or AP  Result Date: 01/23/2022 CLINICAL DATA:  Status post bronchoscopy and biopsy. EXAM: CHEST  1 VIEW COMPARISON:  CT chest from 2 days ago. Chest x-ray dated November 22, 2021. FINDINGS: The heart size and mediastinal contours are within normal limits. Normal pulmonary vascularity. Small ill-defined nodular opacities at both lung bases and in the left parahilar upper lobe are not significantly changed. Grossly unchanged cavitary mass in the right upper lobe. No pleural effusion or pneumothorax. No acute osseous abnormality. IMPRESSION: 1. No pneumothorax status post bronchoscopy. 2. Unchanged cavitary mass in the right upper lobe. 3. Unchanged ill-defined nodular opacities at both lung bases and in the left perihilar upper lobe. Electronically Signed   By: Titus Dubin M.D.   On: 01/23/2022 15:40   MR Brain W Wo Contrast  Result Date: 02/05/2022 CLINICAL DATA:  Metastatic disease evaluation. EXAM: MRI HEAD WITHOUT AND WITH CONTRAST TECHNIQUE: Multiplanar, multiecho pulse sequences of the brain and surrounding structures were obtained without and with intravenous contrast. CONTRAST:  74m GADAVIST GADOBUTROL 1 MMOL/ML IV SOLN COMPARISON:  05/01/2011 FINDINGS: Brain: No restricted diffusion to suggest acute or subacute infarct. No acute hemorrhage, mass, mass effect, or midline shift. No hydrocephalus or extra-axial collection. No abnormal parenchymal or meningeal enhancement. Scattered T2 hyperintense signal in the periventricular white matter, likely the  sequela of minimal chronic small vessel ischemic disease. Normal pituitary. Normal craniocervical junction. Vascular: Patent arterial flow voids. Skull and upper cervical spine: Normal marrow signal. Sinuses/Orbits: Mild mucosal thickening. Dysconjugate gaze. The orbits are otherwise unremarkable. Other: The mastoids are well aerated. IMPRESSION: No acute intracranial process. No evidence of metastatic disease in the brain. Electronically Signed   By: AMerilyn BabaM.D.   On: 02/05/2022 03:51   NM PET Image Initial (PI) Skull Base To Thigh (F-18 FDG)  Result Date: 02/08/2022 CLINICAL DATA:  Initial treatment strategy for lung mass. EXAM: NUCLEAR MEDICINE PET SKULL BASE TO THIGH TECHNIQUE: mCi F-18 FDG was injected intravenously. Full-ring PET imaging was performed from the skull base to thigh after the radiotracer. CT data was obtained and used for attenuation correction and anatomic localization. Fasting blood glucose:  mg/dl COMPARISON:  Chest CT dated May eighth 2023 FINDINGS: Mediastinal blood pool activity: SUV max 2.6 Liver activity: SUV max 3.0 NECK: No hypermetabolic lymph nodes in the neck. Incidental CT findings: none CHEST: Hypermetabolic cavitary mass of the right upper lobe measuring 3.3 cm, SUV max of 28.6. Hypermetabolic left hilar mass versus conglomerate hilar lymphadenopathy measuring approximally 4.4 x 2.3 cm, SUV max of 14.3. Peribronchovascular nodular opacities of the of the bilateral lower lobes and left upper lobe are similar prior exam and demonstrate no significant FDG uptake. Incidental CT findings: Left main and three-vessel coronary artery calcifications. Atherosclerotic disease of the thoracic aorta. Centrilobular emphysema. ABDOMEN/PELVIS: No abnormal hypermetabolic activity within the liver, pancreas, adrenal glands, or spleen. No hypermetabolic lymph nodes in the abdomen or  pelvis. Incidental CT findings: Bilateral simple appearing renal cysts with no FDG activity, no further  follow-up imaging is recommended for these lesions. Nonobstructing right renal stone. Wall thickening of the bladder. Atherosclerotic disease of the abdominal aorta. Mildly enlarged left external iliac lymph node measuring 1.2 cm in short axis on series 2, image 195 with no significant FDG uptake, SUV max of 1.8 SKELETON: No focal hypermetabolic activity to suggest skeletal metastasis. Incidental CT findings: Focal soft tissue FDG uptake adjacent to the right humerus, likely due to tendinitis. IMPRESSION: 1. Hypermetabolic cavitary mass of the right upper lobe, compatible with primary lung malignancy. 2. Hypermetabolic left hilar mass versus conglomerate hilar lymphadenopathy, possibly due to nodal metastatic disease or primary lung malignancy. 3. Peribronchovascular nodular opacities of the of the bilateral lower lobes and left upper lobe are similar prior exam and demonstrate no significant FDG, potentially infectious. Recommend attention on follow-up. 4. Mildly enlarged left external iliac lymph node with no significant FDG uptake, likely reactive. Recommend attention on follow-up. 5. No evidence of distant metastatic disease in the abdomen or pelvis. Electronically Signed   By: Yetta Glassman M.D.   On: 02/08/2022 09:29   DG C-Arm 1-60 Min-No Report  Result Date: 01/23/2022 Fluoroscopy was utilized by the requesting physician.  No radiographic interpretation.   IR IMAGING GUIDED PORT INSERTION  Result Date: 02/12/2022 INDICATION: 71 year old male with bilateral lung cancer. He requires durable venous access for concurrent chemo radiation therapy and presents for port catheter placement. EXAM: IMPLANTED PORT A CATH PLACEMENT WITH ULTRASOUND AND FLUOROSCOPIC GUIDANCE MEDICATIONS: None. ANESTHESIA/SEDATION: Versed 1 mg IV; Fentanyl 50 mcg IV; Moderate Sedation Time:  21 minutes The patient's vital signs and level of consciousness were continuously monitored during the procedure by the interventional  radiology nurse under my direct supervision. FLUOROSCOPY: Radiation exposure index: 4.0 mGy reference air kerma COMPLICATIONS: None immediate. PROCEDURE: The right neck and chest was prepped with chlorhexidine, and draped in the usual sterile fashion using maximum barrier technique (cap and mask, sterile gown, sterile gloves, large sterile sheet, hand hygiene and cutaneous antiseptic). Local anesthesia was attained by infiltration with 1% lidocaine with epinephrine. Ultrasound demonstrated patency of the right internal jugular vein, and this was documented with an image. Under real-time ultrasound guidance, this vein was accessed with a 21 gauge micropuncture needle and image documentation was performed. A small dermatotomy was made at the access site with an 11 scalpel. A 0.018" wire was advanced into the SVC and the access needle exchanged for a 30F micropuncture vascular sheath. The 0.018" wire was then removed and a 0.035" wire advanced into the IVC. An appropriate location for the subcutaneous reservoir was selected below the clavicle and an incision was made through the skin and underlying soft tissues. The subcutaneous tissues were then dissected using a combination of blunt and sharp surgical technique and a pocket was formed. A single lumen power injectable portacatheter was then tunneled through the subcutaneous tissues from the pocket to the dermatotomy and the port reservoir placed within the subcutaneous pocket. The venous access site was then serially dilated and a peel away vascular sheath placed over the wire. The wire was removed and the port catheter advanced into position under fluoroscopic guidance. The catheter tip is positioned in the superior cavoatrial junction. This was documented with a spot image. The portacatheter was then tested and found to flush and aspirate well. The port was flushed with saline followed by 100 units/mL heparinized saline. The pocket was then closed in two  layers using  first subdermal inverted interrupted absorbable sutures followed by a running subcuticular suture. The epidermis was then sealed with Dermabond. The dermatotomy at the venous access site was also closed with Dermabond. IMPRESSION: Successful placement of a right IJ approach Power Port with ultrasound and fluoroscopic guidance. The catheter is ready for use. Electronically Signed   By: Jacqulynn Cadet M.D.   On: 02/12/2022 15:15   CT Super D Chest Wo Contrast  Result Date: 01/21/2022 CLINICAL DATA:  Navigational biopsy planning, cavitary lung lesion, emphysema EXAM: CT CHEST WITHOUT CONTRAST TECHNIQUE: Multidetector CT imaging of the chest was performed using thin slice collimation for electromagnetic bronchoscopy planning purposes, without intravenous contrast. RADIATION DOSE REDUCTION: This exam was performed according to the departmental dose-optimization program which includes automated exposure control, adjustment of the mA and/or kV according to patient size and/or use of iterative reconstruction technique. COMPARISON:  None Available. FINDINGS: Cardiovascular: Aortic atherosclerosis. Three-vessel coronary artery calcifications. Normal heart size. No pericardial effusion. Mediastinum/Nodes: Although evaluation is limited by lack of intravenous contrast, suspect significant left hilar lymphadenopathy or mass, with apparent obstruction of the left upper lobe bronchus and severe narrowing of the left lower lobe bronchus (series 2, image 30). There may be additional right hilar lymphadenopathy, again assessment limited (series 2, image 30). Thyroid gland, trachea, and esophagus demonstrate no significant findings. Lungs/Pleura: Moderate to severe centrilobular emphysema. Thick-walled cavitary lesion of the posterior right upper lobe abutting the major fissure measuring 3.3 x 3.3 cm (series 3, image 14). Scattered heterogeneous and ground-glass airspace opacity throughout the upper lobes and right lower lobe  (series 3, image 35, 46). No pleural effusion or pneumothorax. Upper Abdomen: No acute abnormality. Musculoskeletal: No chest wall abnormality. No suspicious osseous lesions identified. IMPRESSION: 1. Thick-walled cavitary lesion of the posterior right upper lobe abutting the major fissure measuring 3.3 x 3.3 cm, highly concerning for primary lung malignancy or metastasis. 2. Although evaluation is limited by lack of intravenous contrast, suspect significant left hilar lymphadenopathy or mass, with apparent obstruction of the left upper lobe bronchus and severe narrowing of the left lower lobe bronchus. This finding is likewise highly concerning for primary lung malignancy or alternately nodal metastasis. Contrast enhanced CT may be helpful to more clearly evaluate the hilum. 3. There may be additional right hilar lymphadenopathy, again assessment limited by noncontrast CT. 4. Scattered heterogeneous and ground-glass airspace opacity throughout the upper lobes and right lower lobe, possibly reflecting postobstructive airspace disease in the left lung generally nonspecific. 5. Emphysema. 6. Coronary artery disease. Aortic Atherosclerosis (ICD10-I70.0) and Emphysema (ICD10-J43.9). Electronically Signed   By: Delanna Ahmadi M.D.   On: 01/21/2022 11:58    ASSESSMENT & PLAN:   Malignant neoplasm of upper lobe of right lung ([facility]) # Clinical stage III vs Stage IV [left hilar with contralateral right lung nodule] -right upper lobe lung cancer non-small cell favor-squamous cell; Left Hilar- Bx- squamous cell.  PET MAY 29BM- Hypermetabolic cavitary mass of the right upper lobe; Hypermetabolic left hilar mass versus conglomerate hilar lymphadenopathy, possibly due to nodal metastatic disease or primary lung malignancy.S/p  discussion at tumor conference on May 25. Pending-r NGS-F-ONE.  Planned chemoradiation-CarboTaxol weekly; followed by adjuvant immunotherapy.  Post chemoradiation plan SBRT-right upper lobe  mass.  #Proceed with CarboTaxol weekly chemotherapy. Labs today reviewed;  acceptable for treatment today.   # Smoking: Active smoker; quit smoking recently. On  nicotine patches.  # COPD: Stable- on proair- continue inhalers as per pulmonary.  #Diabetes-monitor closely on chemotherapy. Recommend  BG/alncets; strips 2 or 3 times a day  #Handicapped sticker: signed.   # DISPOSITION: # chemo today- carboTaxol weekly.  # in 1 week-labs- cbc/cmp; carbo-taxol # Follow up in 2 weeks- MD- labs- cbc/cmp; carbo-taxol weekly- Dr.B        All questions were answered. The patient knows to call the clinic with any problems, questions or concerns.       Cammie Sickle, MD 02/19/2022 10:51 AM

## 2022-02-20 ENCOUNTER — Ambulatory Visit: Payer: Medicare Other

## 2022-02-20 ENCOUNTER — Other Ambulatory Visit: Payer: Self-pay

## 2022-02-20 ENCOUNTER — Ambulatory Visit
Admission: RE | Admit: 2022-02-20 | Discharge: 2022-02-20 | Disposition: A | Discharge status: Home / Self Care | Payer: Medicare Other | Source: Ambulatory Visit | Attending: Radiation Oncology | Admitting: Radiation Oncology

## 2022-02-20 ENCOUNTER — Inpatient Hospital Stay (HOSPITAL_BASED_OUTPATIENT_CLINIC_OR_DEPARTMENT_OTHER): Payer: Medicare Other | Admitting: Hospice and Palliative Medicine

## 2022-02-20 DIAGNOSIS — C3402 Malignant neoplasm of left main bronchus: Secondary | ICD-10-CM | POA: Diagnosis not present

## 2022-02-20 DIAGNOSIS — C3411 Malignant neoplasm of upper lobe, right bronchus or lung: Secondary | ICD-10-CM

## 2022-02-20 LAB — RAD ONC ARIA SESSION SUMMARY
Course Elapsed Days: 0
Plan Fractions Treated to Date: 1
Plan Prescribed Dose Per Fraction: 2 Gy
Plan Total Fractions Prescribed: 35
Plan Total Prescribed Dose: 70 Gy
Reference Point Dosage Given to Date: 2 Gy
Reference Point Session Dosage Given: 2 Gy
Session Number: 1

## 2022-02-20 NOTE — Progress Notes (Signed)
Multidisciplinary Oncology Council Documentation  Louis Sullivan was presented by our [facility] on 02/20/2022, which included representatives from:  Palliative Care Dietitian  Physical/Occupational Therapist Nurse Navigator Genetics Speech Therapist Social work Survivorship RN Financial Navigator Research RN   Louis Sullivan currently presents with history of lung cancer  We reviewed previous medical and familial history, history of present illness, and recent lab results along with all available histopathologic and imaging studies. [facility] considered available treatment options and made the following recommendations/referrals:  None currently, monitor for needs  The MOC is a meeting of clinicians from various specialty areas who evaluate and discuss patients for whom a multidisciplinary approach is being considered. Final determinations in the plan of care are those of the provider(s).   Today's extended care, comprehensive team conference, Louis Sullivan was not present for the discussion and was not examined.

## 2022-02-21 ENCOUNTER — Telehealth: Payer: Self-pay

## 2022-02-21 ENCOUNTER — Other Ambulatory Visit: Payer: Self-pay

## 2022-02-21 ENCOUNTER — Ambulatory Visit
Admission: RE | Admit: 2022-02-21 | Discharge: 2022-02-21 | Disposition: A | Discharge status: Home / Self Care | Payer: Medicare Other | Source: Ambulatory Visit | Attending: Radiation Oncology | Admitting: Radiation Oncology

## 2022-02-21 DIAGNOSIS — C3402 Malignant neoplasm of left main bronchus: Secondary | ICD-10-CM | POA: Diagnosis not present

## 2022-02-21 LAB — RAD ONC ARIA SESSION SUMMARY
Course Elapsed Days: 1
Plan Fractions Treated to Date: 2
Plan Prescribed Dose Per Fraction: 2 Gy
Plan Total Fractions Prescribed: 35
Plan Total Prescribed Dose: 70 Gy
Reference Point Dosage Given to Date: 4 Gy
Reference Point Session Dosage Given: 2 Gy
Session Number: 2

## 2022-02-21 NOTE — Telephone Encounter (Signed)
Telephone call to patient for follow up after receiving first infusion.   Patient states infusion went great.  States eating good and drinking plenty of fluids.   Denies any nausea or vomiting.  Encouraged patient to call for any concerns or questions. 

## 2022-02-22 ENCOUNTER — Other Ambulatory Visit: Payer: Self-pay

## 2022-02-22 ENCOUNTER — Ambulatory Visit
Admission: RE | Admit: 2022-02-22 | Discharge: 2022-02-22 | Disposition: A | Discharge status: Home / Self Care | Payer: Medicare Other | Source: Ambulatory Visit | Attending: Radiation Oncology | Admitting: Radiation Oncology

## 2022-02-22 DIAGNOSIS — C3402 Malignant neoplasm of left main bronchus: Secondary | ICD-10-CM | POA: Diagnosis not present

## 2022-02-22 LAB — RAD ONC ARIA SESSION SUMMARY
Course Elapsed Days: 2
Plan Fractions Treated to Date: 3
Plan Prescribed Dose Per Fraction: 2 Gy
Plan Total Fractions Prescribed: 35
Plan Total Prescribed Dose: 70 Gy
Reference Point Dosage Given to Date: 6 Gy
Reference Point Session Dosage Given: 2 Gy
Session Number: 3

## 2022-02-25 ENCOUNTER — Ambulatory Visit
Admission: RE | Admit: 2022-02-25 | Discharge: 2022-02-25 | Disposition: A | Discharge status: Home / Self Care | Payer: Medicare Other | Source: Ambulatory Visit | Attending: Radiation Oncology | Admitting: Radiation Oncology

## 2022-02-25 ENCOUNTER — Other Ambulatory Visit: Payer: Self-pay

## 2022-02-25 DIAGNOSIS — C3402 Malignant neoplasm of left main bronchus: Secondary | ICD-10-CM | POA: Diagnosis not present

## 2022-02-25 LAB — RAD ONC ARIA SESSION SUMMARY
Course Elapsed Days: 5
Plan Fractions Treated to Date: 4
Plan Prescribed Dose Per Fraction: 2 Gy
Plan Total Fractions Prescribed: 35
Plan Total Prescribed Dose: 70 Gy
Reference Point Dosage Given to Date: 8 Gy
Reference Point Session Dosage Given: 2 Gy
Session Number: 4

## 2022-02-25 MED FILL — Dexamethasone Sodium Phosphate Inj 100 MG/10ML: INTRAMUSCULAR | Qty: 1 | Status: AC

## 2022-02-26 ENCOUNTER — Inpatient Hospital Stay: Payer: Medicare Other

## 2022-02-26 ENCOUNTER — Other Ambulatory Visit: Payer: Self-pay

## 2022-02-26 ENCOUNTER — Other Ambulatory Visit: Payer: Self-pay | Admitting: Internal Medicine

## 2022-02-26 ENCOUNTER — Ambulatory Visit
Admission: RE | Admit: 2022-02-26 | Discharge: 2022-02-26 | Disposition: A | Discharge status: Home / Self Care | Payer: Medicare Other | Source: Ambulatory Visit | Attending: Radiation Oncology | Admitting: Radiation Oncology

## 2022-02-26 DIAGNOSIS — C3402 Malignant neoplasm of left main bronchus: Secondary | ICD-10-CM | POA: Diagnosis not present

## 2022-02-26 LAB — RAD ONC ARIA SESSION SUMMARY
Course Elapsed Days: 6
Plan Fractions Treated to Date: 5
Plan Prescribed Dose Per Fraction: 2 Gy
Plan Total Fractions Prescribed: 35
Plan Total Prescribed Dose: 70 Gy
Reference Point Dosage Given to Date: 10 Gy
Reference Point Session Dosage Given: 2 Gy
Session Number: 5

## 2022-02-27 ENCOUNTER — Inpatient Hospital Stay: Payer: Medicare Other

## 2022-02-27 ENCOUNTER — Other Ambulatory Visit: Payer: Self-pay

## 2022-02-27 ENCOUNTER — Other Ambulatory Visit: Payer: Medicare Other

## 2022-02-27 ENCOUNTER — Ambulatory Visit
Admission: RE | Admit: 2022-02-27 | Discharge: 2022-02-27 | Disposition: A | Discharge status: Home / Self Care | Payer: Medicare Other | Source: Ambulatory Visit | Attending: Radiation Oncology | Admitting: Radiation Oncology

## 2022-02-27 ENCOUNTER — Encounter: Payer: Self-pay | Admitting: Nurse Practitioner

## 2022-02-27 VITALS — BP 119/75 | HR 87 | Temp 96.0°F | Resp 20 | Wt 208.6 lb

## 2022-02-27 DIAGNOSIS — C3402 Malignant neoplasm of left main bronchus: Secondary | ICD-10-CM | POA: Diagnosis not present

## 2022-02-27 DIAGNOSIS — R0602 Shortness of breath: Secondary | ICD-10-CM

## 2022-02-27 DIAGNOSIS — C3411 Malignant neoplasm of upper lobe, right bronchus or lung: Secondary | ICD-10-CM

## 2022-02-27 LAB — CBC WITH DIFFERENTIAL/PLATELET
Abs Immature Granulocytes: 0.01 10*3/uL (ref 0.00–0.07)
Basophils Absolute: 0 10*3/uL (ref 0.0–0.1)
Basophils Relative: 1 %
Eosinophils Absolute: 0.2 10*3/uL (ref 0.0–0.5)
Eosinophils Relative: 5 %
HCT: 44.7 % (ref 39.0–52.0)
Hemoglobin: 14.1 g/dL (ref 13.0–17.0)
Immature Granulocytes: 0 %
Lymphocytes Relative: 20 %
Lymphs Abs: 0.9 10*3/uL (ref 0.7–4.0)
MCH: 29.6 pg (ref 26.0–34.0)
MCHC: 31.5 g/dL (ref 30.0–36.0)
MCV: 93.7 fL (ref 80.0–100.0)
Monocytes Absolute: 0.5 10*3/uL (ref 0.1–1.0)
Monocytes Relative: 10 %
Neutro Abs: 2.9 10*3/uL (ref 1.7–7.7)
Neutrophils Relative %: 64 %
Platelets: 183 10*3/uL (ref 150–400)
RBC: 4.77 MIL/uL (ref 4.22–5.81)
RDW: 12.9 % (ref 11.5–15.5)
WBC: 4.4 10*3/uL (ref 4.0–10.5)
nRBC: 0 % (ref 0.0–0.2)

## 2022-02-27 LAB — COMPREHENSIVE METABOLIC PANEL
ALT: 14 U/L (ref 0–44)
AST: 17 U/L (ref 15–41)
Albumin: 3.8 g/dL (ref 3.5–5.0)
Alkaline Phosphatase: 87 U/L (ref 38–126)
Anion gap: 6 (ref 5–15)
BUN: 12 mg/dL (ref 8–23)
CO2: 30 mmol/L (ref 22–32)
Calcium: 8.6 mg/dL — ABNORMAL LOW (ref 8.9–10.3)
Chloride: 102 mmol/L (ref 98–111)
Creatinine, Ser: 0.9 mg/dL (ref 0.61–1.24)
GFR, Estimated: >60 mL/min (ref >60)
Glucose, Bld: 102 mg/dL — ABNORMAL HIGH (ref 70–99)
Potassium: 4.3 mmol/L (ref 3.5–5.1)
Sodium: 138 mmol/L (ref 135–145)
Total Bilirubin: 0.7 mg/dL (ref 0.3–1.2)
Total Protein: 7 g/dL (ref 6.5–8.1)

## 2022-02-27 LAB — RAD ONC ARIA SESSION SUMMARY
Course Elapsed Days: 7
Plan Fractions Treated to Date: 6
Plan Prescribed Dose Per Fraction: 2 Gy
Plan Total Fractions Prescribed: 35
Plan Total Prescribed Dose: 70 Gy
Reference Point Dosage Given to Date: 12 Gy
Reference Point Session Dosage Given: 2 Gy
Session Number: 6

## 2022-02-27 MED ORDER — SODIUM CHLORIDE 0.9 % IV SOLN
Freq: Once | INTRAVENOUS | Status: DC | PRN
  Filled 2022-02-27: qty 250

## 2022-02-27 MED ORDER — SODIUM CHLORIDE 0.9 % IV SOLN
10.0000 mg | Freq: Once | INTRAVENOUS | Status: AC
  Administered 2022-02-27: 10 mg via INTRAVENOUS
  Filled 2022-02-27: qty 10

## 2022-02-27 MED ORDER — SODIUM CHLORIDE 0.9 % IV SOLN
231.6000 mg | Freq: Once | INTRAVENOUS | Status: AC
  Administered 2022-02-27: 230 mg via INTRAVENOUS
  Filled 2022-02-27: qty 23

## 2022-02-27 MED ORDER — PALONOSETRON HCL INJECTION 0.25 MG/5ML
0.2500 mg | Freq: Once | INTRAVENOUS | Status: AC
  Administered 2022-02-27: 0.25 mg via INTRAVENOUS
  Filled 2022-02-27: qty 5

## 2022-02-27 MED ORDER — HEPARIN SOD (PORK) LOCK FLUSH 100 UNIT/ML IV SOLN
500.0000 [IU] | Freq: Once | INTRAVENOUS | Status: AC
  Administered 2022-02-27: 500 [IU] via INTRAVENOUS
  Filled 2022-02-27: qty 5

## 2022-02-27 MED ORDER — METHYLPREDNISOLONE SODIUM SUCC 125 MG IJ SOLR
125.0000 mg | Freq: Once | INTRAMUSCULAR | Status: AC | PRN
  Administered 2022-02-27: 125 mg via INTRAVENOUS

## 2022-02-27 MED ORDER — SODIUM CHLORIDE 0.9% FLUSH
10.0000 mL | INTRAVENOUS | Status: DC | PRN
  Administered 2022-02-27: 10 mL via INTRAVENOUS
  Filled 2022-02-27: qty 10

## 2022-02-27 MED ORDER — DIPHENHYDRAMINE HCL 50 MG/ML IJ SOLN
50.0000 mg | Freq: Once | INTRAMUSCULAR | Status: AC
  Administered 2022-02-27: 50 mg via INTRAVENOUS
  Filled 2022-02-27: qty 1

## 2022-02-27 MED ORDER — SODIUM CHLORIDE 0.9 % IV SOLN
Freq: Once | INTRAVENOUS | Status: AC
  Filled 2022-02-27: qty 250

## 2022-02-27 MED ORDER — SODIUM CHLORIDE 0.9 % IV SOLN
45.0000 mg/m2 | Freq: Once | INTRAVENOUS | Status: AC
  Administered 2022-02-27: 96 mg via INTRAVENOUS
  Filled 2022-02-27: qty 16

## 2022-02-27 MED ORDER — SODIUM CHLORIDE 0.9% FLUSH
10.0000 mL | INTRAVENOUS | Status: DC | PRN
  Filled 2022-02-27: qty 10

## 2022-02-27 MED ORDER — HEPARIN SOD (PORK) LOCK FLUSH 100 UNIT/ML IV SOLN
500.0000 [IU] | Freq: Once | INTRAVENOUS | Status: DC | PRN
  Filled 2022-02-27: qty 5

## 2022-02-27 MED ORDER — MONTELUKAST SODIUM 10 MG PO TABS
10.0000 mg | ORAL_TABLET | Freq: Every day | ORAL | Status: DC
  Administered 2022-02-27: 10 mg via ORAL

## 2022-02-27 MED ORDER — FAMOTIDINE IN NACL 20-0.9 MG/50ML-% IV SOLN
20.0000 mg | Freq: Once | INTRAVENOUS | Status: AC
  Administered 2022-02-27: 20 mg via INTRAVENOUS
  Filled 2022-02-27: qty 50

## 2022-02-27 NOTE — Patient Instructions (Signed)
[facility]  Discharge Instructions: Thank you for choosing [facility] to provide your oncology and hematology care.  If you have a lab appointment with the Cancer Center, please go directly to the Cancer Center and check in at the registration area.  Wear comfortable clothing and clothing appropriate for easy access to any Portacath or PICC line.   We strive to give you quality time with your provider. You may need to reschedule your appointment if you arrive late (15 or more minutes).  Arriving late affects you and other patients whose appointments are after yours.  Also, if you miss three or more appointments without notifying the office, you may be dismissed from the clinic at the provider's discretion.      For prescription refill requests, have your pharmacy contact our office and allow 72 hours for refills to be completed.       To help prevent nausea and vomiting after your treatment, we encourage you to take your nausea medication as directed.  BELOW ARE SYMPTOMS THAT SHOULD BE REPORTED IMMEDIATELY: *FEVER GREATER THAN 100.4 F (38 C) OR HIGHER *CHILLS OR SWEATING *NAUSEA AND VOMITING THAT IS NOT CONTROLLED WITH YOUR NAUSEA MEDICATION *UNUSUAL SHORTNESS OF BREATH *UNUSUAL BRUISING OR BLEEDING *URINARY PROBLEMS (pain or burning when urinating, or frequent urination) *BOWEL PROBLEMS (unusual diarrhea, constipation, pain near the anus) TENDERNESS IN MOUTH AND THROAT WITH OR WITHOUT PRESENCE OF ULCERS (sore throat, sores in mouth, or a toothache) UNUSUAL RASH, SWELLING OR PAIN  UNUSUAL VAGINAL DISCHARGE OR ITCHING   Items with * indicate a potential emergency and should be followed up as soon as possible or go to the Emergency Department if any problems should occur.  Please show the CHEMOTHERAPY ALERT CARD or IMMUNOTHERAPY ALERT CARD at check-in to the Emergency Department and triage nurse.  Should you have questions after your  visit or need to cancel or reschedule your appointment, please contact [facility]  [phone] and follow the prompts.  Office hours are 8:00 a.m. to 4:30 p.m. Monday - Friday. Please note that voicemails left after 4:00 p.m. may not be returned until the following business day.  We are closed weekends and major holidays. You have access to a nurse at all times for urgent questions. Please call the main number to the clinic [phone] and follow the prompts.  For any non-urgent questions, you may also contact your provider using MyChart. We now offer e-Visits for anyone 18 and older to request care online for non-urgent symptoms. For details visit [email or web address].   Also download the MyChart app! Go to the app store, search "MyChart", open the app, select [facility], and log in with your MyChart username and password.  Masks are optional in the cancer centers. If you would like for your care team to wear a mask while they are taking care of you, please let them know. For doctor visits, patients may have with them one support person who is at least 71 years old. At this time, visitors are not allowed in the infusion area.   

## 2022-02-27 NOTE — Progress Notes (Signed)
Cycle 2 of Taxol started at 0959. Patient started coughing and complaining of shortness of breath and back pain, Taxol stopped at 1009. IV NS started at 999 and Louis Rutter NP chairside at 1011. Bilateral wheezing noted by NP, Solumedrol 125 mg given. O2 started via nasal canula @ 2L. Symptoms began to improve at 1013. Patient was closely monitored and vital signs taken per protocol. Singulair given per NP at 1019. 1021 it was noted that patient had urinated on himself. 1022 patient was assisted to the restroom. 1031 patient was assisted back to chair, with some shortness of breath noted. Patient closely monitored and at 1037, all symptoms resolved. Per Louis Rutter, NP, restarted Taxol at 1041 at 25% , 63 cc/hr (as a first time rate). Increased rate Q 15 minutes until full rate of 250 cc/hr reached. O2 was applied when patient fell asleep because O2 dropped to 86%. (Patient is noted to be on home O2 while sleeping.) O2 improved and monitored. Bag finished at 1209 without further incident. Carboplatin started at 1213. Patient alert and oriented and in no distress. 1230 patient sitting up and eating lunch.

## 2022-02-27 NOTE — Progress Notes (Signed)
Chemotherapy completed and patient discharged to home in stable condition.

## 2022-02-28 ENCOUNTER — Encounter: Payer: Self-pay | Admitting: *Deleted

## 2022-02-28 ENCOUNTER — Encounter: Payer: Self-pay | Admitting: Internal Medicine

## 2022-02-28 ENCOUNTER — Other Ambulatory Visit: Payer: Self-pay

## 2022-02-28 ENCOUNTER — Other Ambulatory Visit: Payer: Self-pay | Admitting: Nurse Practitioner

## 2022-02-28 ENCOUNTER — Ambulatory Visit
Admission: RE | Admit: 2022-02-28 | Discharge: 2022-02-28 | Disposition: A | Discharge status: Home / Self Care | Payer: Medicare Other | Source: Ambulatory Visit | Attending: Radiation Oncology | Admitting: Radiation Oncology

## 2022-02-28 DIAGNOSIS — C3402 Malignant neoplasm of left main bronchus: Secondary | ICD-10-CM | POA: Diagnosis not present

## 2022-02-28 DIAGNOSIS — I499 Cardiac arrhythmia, unspecified: Secondary | ICD-10-CM

## 2022-02-28 LAB — RAD ONC ARIA SESSION SUMMARY
Course Elapsed Days: 8
Plan Fractions Treated to Date: 7
Plan Prescribed Dose Per Fraction: 2 Gy
Plan Total Fractions Prescribed: 35
Plan Total Prescribed Dose: 70 Gy
Reference Point Dosage Given to Date: 14 Gy
Reference Point Session Dosage Given: 2 Gy
Session Number: 7

## 2022-02-28 NOTE — Progress Notes (Signed)
Patient noted to have irregular heart beat post administration of IV steroids. Per patient, this is a chronic, episodic problem for him. Previously saw Dr Chancy Milroy but hasn't in many years. Discussed with Dr. Rogue Bussing who agrees with referral to cardiology. Ref sent.

## 2022-02-28 NOTE — Progress Notes (Signed)
Responded to infusion for possible reaction to taxol. Patient was second time taxol. Shortly after infusionbegan he developed cough, shortness of breath, and back pain. See nursing note for further details. 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 His symptoms improved with solumedrol and iv fluids. I ordered singulair. I suspect that reaction was rate based as opposed to allergic reaction. Recommended that we re-challenge at slower rate. This was discussed with Dr. Rogue Bussing who agreed. He was re-initiated at initial first time rate and was able to tolerate q15 minute rate increases to max rate of 250 cc/hr. He had no additional complications.  I recommend that patient's future taxol infusion be titrated up as previous or given at overall slower rate. Discussed with Dr. Rogue Bussing who will adjust orders and scheduled chair time.

## 2022-03-01 ENCOUNTER — Other Ambulatory Visit: Payer: Self-pay

## 2022-03-01 ENCOUNTER — Encounter: Payer: Self-pay | Admitting: Internal Medicine

## 2022-03-01 ENCOUNTER — Ambulatory Visit
Admission: RE | Admit: 2022-03-01 | Discharge: 2022-03-01 | Disposition: A | Discharge status: Home / Self Care | Payer: Medicare Other | Source: Ambulatory Visit | Attending: Radiation Oncology | Admitting: Radiation Oncology

## 2022-03-01 DIAGNOSIS — C3402 Malignant neoplasm of left main bronchus: Secondary | ICD-10-CM | POA: Diagnosis not present

## 2022-03-01 LAB — RAD ONC ARIA SESSION SUMMARY
Course Elapsed Days: 9
Plan Fractions Treated to Date: 8
Plan Prescribed Dose Per Fraction: 2 Gy
Plan Total Fractions Prescribed: 35
Plan Total Prescribed Dose: 70 Gy
Reference Point Dosage Given to Date: 16 Gy
Reference Point Session Dosage Given: 2 Gy
Session Number: 8

## 2022-03-04 ENCOUNTER — Other Ambulatory Visit: Payer: Self-pay

## 2022-03-04 ENCOUNTER — Ambulatory Visit
Admission: RE | Admit: 2022-03-04 | Discharge: 2022-03-04 | Disposition: A | Discharge status: Home / Self Care | Payer: Medicare Other | Source: Ambulatory Visit | Attending: Radiation Oncology | Admitting: Radiation Oncology

## 2022-03-04 DIAGNOSIS — C3402 Malignant neoplasm of left main bronchus: Secondary | ICD-10-CM | POA: Diagnosis not present

## 2022-03-04 LAB — RAD ONC ARIA SESSION SUMMARY
Course Elapsed Days: 12
Plan Fractions Treated to Date: 9
Plan Prescribed Dose Per Fraction: 2 Gy
Plan Total Fractions Prescribed: 35
Plan Total Prescribed Dose: 70 Gy
Reference Point Dosage Given to Date: 18 Gy
Reference Point Session Dosage Given: 2 Gy
Session Number: 9

## 2022-03-04 MED FILL — Dexamethasone Sodium Phosphate Inj 100 MG/10ML: INTRAMUSCULAR | Qty: 1 | Status: AC

## 2022-03-05 ENCOUNTER — Ambulatory Visit
Admission: RE | Admit: 2022-03-05 | Discharge: 2022-03-05 | Disposition: A | Discharge status: Home / Self Care | Payer: Medicare Other | Source: Ambulatory Visit | Attending: Radiation Oncology | Admitting: Radiation Oncology

## 2022-03-05 ENCOUNTER — Other Ambulatory Visit: Payer: Self-pay

## 2022-03-05 ENCOUNTER — Telehealth: Payer: Self-pay | Admitting: Internal Medicine

## 2022-03-05 ENCOUNTER — Inpatient Hospital Stay (HOSPITAL_BASED_OUTPATIENT_CLINIC_OR_DEPARTMENT_OTHER): Payer: Medicare Other | Admitting: Internal Medicine

## 2022-03-05 ENCOUNTER — Inpatient Hospital Stay: Payer: Medicare Other

## 2022-03-05 ENCOUNTER — Encounter: Payer: Self-pay | Admitting: *Deleted

## 2022-03-05 VITALS — BP 147/92 | HR 99 | Temp 96.3°F | Resp 20

## 2022-03-05 DIAGNOSIS — C3411 Malignant neoplasm of upper lobe, right bronchus or lung: Secondary | ICD-10-CM

## 2022-03-05 DIAGNOSIS — C3402 Malignant neoplasm of left main bronchus: Secondary | ICD-10-CM | POA: Diagnosis not present

## 2022-03-05 LAB — CBC WITH DIFFERENTIAL/PLATELET
Abs Immature Granulocytes: 0.02 10*3/uL (ref 0.00–0.07)
Basophils Absolute: 0 10*3/uL (ref 0.0–0.1)
Basophils Relative: 1 %
Eosinophils Absolute: 0.2 10*3/uL (ref 0.0–0.5)
Eosinophils Relative: 5 %
HCT: 43.4 % (ref 39.0–52.0)
Hemoglobin: 13.4 g/dL (ref 13.0–17.0)
Immature Granulocytes: 1 %
Lymphocytes Relative: 27 %
Lymphs Abs: 0.9 10*3/uL (ref 0.7–4.0)
MCH: 29.1 pg (ref 26.0–34.0)
MCHC: 30.9 g/dL (ref 30.0–36.0)
MCV: 94.1 fL (ref 80.0–100.0)
Monocytes Absolute: 0.3 10*3/uL (ref 0.1–1.0)
Monocytes Relative: 9 %
Neutro Abs: 1.9 10*3/uL (ref 1.7–7.7)
Neutrophils Relative %: 57 %
Platelets: 145 10*3/uL — ABNORMAL LOW (ref 150–400)
RBC: 4.61 MIL/uL (ref 4.22–5.81)
RDW: 13.2 % (ref 11.5–15.5)
WBC: 3.4 10*3/uL — ABNORMAL LOW (ref 4.0–10.5)
nRBC: 0 % (ref 0.0–0.2)

## 2022-03-05 LAB — COMPREHENSIVE METABOLIC PANEL
ALT: 17 U/L (ref 0–44)
AST: 17 U/L (ref 15–41)
Albumin: 3.5 g/dL (ref 3.5–5.0)
Alkaline Phosphatase: 77 U/L (ref 38–126)
Anion gap: 6 (ref 5–15)
BUN: 9 mg/dL (ref 8–23)
CO2: 29 mmol/L (ref 22–32)
Calcium: 8.6 mg/dL — ABNORMAL LOW (ref 8.9–10.3)
Chloride: 104 mmol/L (ref 98–111)
Creatinine, Ser: 0.82 mg/dL (ref 0.61–1.24)
GFR, Estimated: >60 mL/min (ref >60)
Glucose, Bld: 106 mg/dL — ABNORMAL HIGH (ref 70–99)
Potassium: 4.2 mmol/L (ref 3.5–5.1)
Sodium: 139 mmol/L (ref 135–145)
Total Bilirubin: 0.7 mg/dL (ref 0.3–1.2)
Total Protein: 6.8 g/dL (ref 6.5–8.1)

## 2022-03-05 LAB — RAD ONC ARIA SESSION SUMMARY
Course Elapsed Days: 13
Plan Fractions Treated to Date: 10
Plan Prescribed Dose Per Fraction: 2 Gy
Plan Total Fractions Prescribed: 35
Plan Total Prescribed Dose: 70 Gy
Reference Point Dosage Given to Date: 20 Gy
Reference Point Session Dosage Given: 2 Gy
Session Number: 10

## 2022-03-05 MED ORDER — PALONOSETRON HCL INJECTION 0.25 MG/5ML
0.2500 mg | Freq: Once | INTRAVENOUS | Status: AC
  Administered 2022-03-05: 0.25 mg via INTRAVENOUS
  Filled 2022-03-05: qty 5

## 2022-03-05 MED ORDER — SODIUM CHLORIDE 0.9 % IV SOLN
45.0000 mg/m2 | Freq: Once | INTRAVENOUS | Status: AC
  Administered 2022-03-05: 96 mg via INTRAVENOUS
  Filled 2022-03-05: qty 16

## 2022-03-05 MED ORDER — METHYLPREDNISOLONE SODIUM SUCC 125 MG IJ SOLR
125.0000 mg | Freq: Once | INTRAMUSCULAR | Status: AC | PRN
  Administered 2022-03-05: 125 mg via INTRAVENOUS

## 2022-03-05 MED ORDER — SODIUM CHLORIDE 0.9 % IV SOLN
10.0000 mg | Freq: Once | INTRAVENOUS | Status: AC
  Administered 2022-03-05: 10 mg via INTRAVENOUS
  Filled 2022-03-05: qty 10

## 2022-03-05 MED ORDER — FAMOTIDINE IN NACL 20-0.9 MG/50ML-% IV SOLN
20.0000 mg | Freq: Once | INTRAVENOUS | Status: AC
  Administered 2022-03-05: 20 mg via INTRAVENOUS
  Filled 2022-03-05: qty 50

## 2022-03-05 MED ORDER — HEPARIN SOD (PORK) LOCK FLUSH 100 UNIT/ML IV SOLN
500.0000 [IU] | Freq: Once | INTRAVENOUS | Status: AC | PRN
  Administered 2022-03-05: 500 [IU]
  Filled 2022-03-05: qty 5

## 2022-03-05 MED ORDER — DIPHENHYDRAMINE HCL 50 MG/ML IJ SOLN
50.0000 mg | Freq: Once | INTRAMUSCULAR | Status: AC
  Administered 2022-03-05: 50 mg via INTRAVENOUS
  Filled 2022-03-05: qty 1

## 2022-03-05 MED ORDER — MONTELUKAST SODIUM 10 MG PO TABS
10.0000 mg | ORAL_TABLET | Freq: Once | ORAL | Status: AC
  Administered 2022-03-05: 10 mg via ORAL
  Filled 2022-03-05: qty 1

## 2022-03-05 MED ORDER — SODIUM CHLORIDE 0.9 % IV SOLN
231.6000 mg | Freq: Once | INTRAVENOUS | Status: AC
  Administered 2022-03-05: 230 mg via INTRAVENOUS
  Filled 2022-03-05: qty 23

## 2022-03-05 MED ORDER — SODIUM CHLORIDE 0.9 % IV SOLN
Freq: Once | INTRAVENOUS | Status: DC | PRN
  Filled 2022-03-05: qty 250

## 2022-03-05 MED ORDER — SODIUM CHLORIDE 0.9 % IV SOLN
Freq: Once | INTRAVENOUS | Status: AC
  Filled 2022-03-05: qty 250

## 2022-03-05 NOTE — Patient Instructions (Signed)
[facility] [place]  Discharge Instructions: Thank you for choosing [facility] to provide your oncology and hematology care.  If you have a lab appointment with the [facility], please go directly to the [facility] and check in at the registration area.  Wear comfortable clothing and clothing appropriate for easy access to any Portacath or PICC line.   We strive to give you quality time with your provider. You may need to reschedule your appointment if you arrive late (15 or more minutes).  Arriving late affects you and other patients whose appointments are after yours.  Also, if you miss three or more appointments without notifying the office, you may be dismissed from the clinic at the provider's discretion.      For prescription refill requests, have your pharmacy contact our office and allow 72 hours for refills to be completed.    Today you received the following chemotherapy and/or immunotherapy agents: Taxol, Carboplatin.  To help prevent nausea and vomiting after your treatment, we encourage you to take your nausea medication as directed.  BELOW ARE SYMPTOMS THAT SHOULD BE REPORTED IMMEDIATELY: *FEVER GREATER THAN 100.4 F (38 C) OR HIGHER *CHILLS OR SWEATING *NAUSEA AND VOMITING THAT IS NOT CONTROLLED WITH YOUR NAUSEA MEDICATION *UNUSUAL SHORTNESS OF BREATH *UNUSUAL BRUISING OR BLEEDING *URINARY PROBLEMS (pain or burning when urinating, or frequent urination) *BOWEL PROBLEMS (unusual diarrhea, constipation, pain near the anus) TENDERNESS IN MOUTH AND THROAT WITH OR WITHOUT PRESENCE OF ULCERS (sore throat, sores in mouth, or a toothache) UNUSUAL RASH, SWELLING OR PAIN  UNUSUAL VAGINAL DISCHARGE OR ITCHING   Items with * indicate a potential emergency and should be followed up as soon as possible or go to the Emergency Department if any problems should occur.  Please show the CHEMOTHERAPY ALERT CARD or IMMUNOTHERAPY ALERT CARD at  check-in to the Emergency Department and triage nurse.  Should you have questions after your visit or need to cancel or reschedule your appointment, please contact [facility] [place] AT [place]  [phone] and follow the prompts.  Office hours are 8:00 a.m. to 4:30 p.m. Monday - Friday. Please note that voicemails left after 4:00 p.m. may not be returned until the following business day.  We are closed weekends and major holidays. You have access to a nurse at all times for urgent questions. Please call the main number to the clinic [phone] and follow the prompts.  For any non-urgent questions, you may also contact your provider using MyChart. We now offer e-Visits for anyone 16 and older to request care online for non-urgent symptoms. For details visit [email or web address].   Also download the MyChart app! Go to the app store, search "MyChart", open the app, select [facility], and log in with your MyChart username and password.  Masks are optional in the cancer centers. If you would like for your care team to wear a mask while they are taking care of you, please let them know. For doctor visits, patients may have with them one support person who is at least 71 years old. At this time, visitors are not allowed in the infusion area.

## 2022-03-05 NOTE — Progress Notes (Signed)
[facility] NOTE  Patient Care Team: [place] as PCP - General Telford Nab, RN as Oncology Nurse Navigator Cammie Sickle, MD as Consulting Physician (Oncology)  CHIEF COMPLAINTS/PURPOSE OF CONSULTATION: lung cancer  #  Oncology History Overview Note  IMPRESSION: 1. Thick-walled cavitary lesion of the posterior right upper lobe abutting the major fissure measuring 3.3 x 3.3 cm, highly concerning for primary lung malignancy or metastasis. 2. Although evaluation is limited by lack of intravenous contrast, suspect significant left hilar lymphadenopathy or mass, with apparent obstruction of the left upper lobe bronchus and severe narrowing of the left lower lobe bronchus. This finding is likewise highly concerning for primary lung malignancy or alternately nodal metastasis. Contrast enhanced CT may be helpful to more clearly evaluate the hilum. 3. There may be additional right hilar lymphadenopathy, again assessment limited by noncontrast CT. 4. Scattered heterogeneous and ground-glass airspace opacity throughout the upper lobes and right lower lobe, possibly reflecting postobstructive airspace disease in the left lung generally nonspecific. 5. Emphysema. 6. Coronary artery disease.   Aortic Atherosclerosis (ICD10-I70.0) and Emphysema (ICD10-J43.9).     Electronically Signed   By: Delanna Ahmadi M.D.   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  Ambulating independently.  Louis Sullivan 71 y.o.  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  Patient had infusion reaction to Taxol last week.  Patient received Solu-Medrol and Benadryl.  And tolerated the Taxol at a lower rate. Patient did not have any reaction to Taxol the first week.    Patient denies any worsening shortness of breath or cough. Patient has chronic mild shortness of breath.  Chronic mild cough not any worse.  He has been using his inhalers ProAir.  Denies any headaches or vision changes.  Review of Systems  Constitutional:  Negative for chills, diaphoresis, fever, malaise/fatigue and weight loss.  HENT:  Negative for nosebleeds and sore throat.   Eyes:  Negative for double vision.  Respiratory:  Positive for cough and shortness of breath. Negative for hemoptysis, sputum production and wheezing.   Cardiovascular:  Negative for chest pain, palpitations, orthopnea and leg swelling.  Gastrointestinal:  Negative for abdominal pain, blood in stool, constipation, diarrhea, heartburn, melena, nausea and vomiting.  Genitourinary:  Negative for dysuria, frequency and urgency.  Musculoskeletal:  Positive for joint pain. Negative for back pain.  Skin: Negative.  Negative for itching and rash.  Neurological:  Negative for dizziness, tingling, focal weakness, weakness and headaches.  Endo/Heme/Allergies:  Does not bruise/bleed easily.  Psychiatric/Behavioral:  Negative for depression. The patient is not nervous/anxious and does not have insomnia.      MEDICAL HISTORY:  Past Medical History:  Diagnosis Date   COPD (chronic obstructive pulmonary disease) (HCC)    Coronary artery disease    1 stent placed in about 2008    Diabetes mellitus without complication (HCC)    Dyspnea    Headache    migraines   Hyperlipidemia    Hypertension    Pneumonia 12/2021   hospitalized at [facility]: Past Surgical History:  Procedure Laterality Date   CARDIAC CATHETERIZATION     COLONOSCOPY     IR IMAGING GUIDED PORT INSERTION  02/12/2022   VIDEO BRONCHOSCOPY WITH ENDOBRONCHIAL NAVIGATION N/A 01/23/2022   Procedure: VIDEO BRONCHOSCOPY WITH ENDOBRONCHIAL NAVIGATION;  Surgeon: Ottie Glazier, MD;  Location: [facility] ORS;  Service: Thoracic;  Laterality: N/A;   VIDEO BRONCHOSCOPY WITH ENDOBRONCHIAL ULTRASOUND N/A 01/23/2022   Procedure: VIDEO BRONCHOSCOPY WITH ENDOBRONCHIAL ULTRASOUND;  Surgeon: Ottie Glazier, MD;  Location: [facility] ORS;  Service: Thoracic;  Laterality: N/A;    SOCIAL HISTORY: Social History   Socioeconomic History   Marital status: Widowed    Spouse name: Not on file   Number of children: Not on file   Years of education: Not on file   Highest education level: Not on file  Occupational History   Not on file  Tobacco Use   Smoking status: Former    Packs/day: 1.00    Years: 20.00    Total pack years: 20.00    Types: Cigarettes    Quit date: 02/11/2022    Years since quitting: 0.0   Smokeless tobacco: Never   Tobacco comments:    Request to MD for Nicotine patch to help him quit smoking.  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 Lives in [place] self. Son passed in 2021;MI wife passed away in 1990s/aneurysm; daughter in [place].    Social Determinants of Health   Financial Resource Strain: Not on file  Food Insecurity: Not on file  Transportation Needs: Not on file  Physical Activity: Not on file  Stress: Not on file  Social Connections: Not on file   Intimate Partner Violence: Not on file    FAMILY HISTORY: Family History  Problem Relation Age of Onset   Throat cancer Brother     ALLERGIES:  has No Known Allergies.  MEDICATIONS:  Current Outpatient Medications  Medication Sig Dispense Refill   albuterol (PROVENTIL) (5 MG/ML) 0.5% nebulizer solution Take 2.5 mg by nebulization every morning.     albuterol (VENTOLIN HFA) 108 (90 Base) MCG/ACT inhaler Inhale 2 puffs into the lungs every 4 (four) hours as needed for wheezing or shortness of breath.     aspirin EC 81 MG tablet Take 81 mg by mouth daily.     atorvastatin (LIPITOR) 40 MG tablet Take 40 mg by mouth every morning.     blood glucose meter kit and supplies KIT Dispense based on patient and insurance preference. Use up to four times daily as directed. 1 each 0   Cholecalciferol (VITAMIN D3 PO) Take 1 tablet by mouth daily at 6 (six) AM.     lidocaine-prilocaine (EMLA) cream Apply on the port. 30 -45 min  prior to port access. 30 g 3   losartan (COZAAR) 100 MG tablet Take 100 mg by mouth every evening.  losartan (COZAAR) 50 MG tablet Take 50 mg by mouth in the morning.     metFORMIN (GLUCOPHAGE) 1000 MG tablet Take 1,000 mg by mouth daily with breakfast.     Nicotine 21-14-7 MG/24HR KIT Place 1 patch onto the skin daily. 60 kit 1   OXYGEN Inhale 2 L into the lungs at bedtime.     ondansetron (ZOFRAN) 8 MG tablet One pill every 8 hours as needed for nausea/vomitting. (Patient not taking: Reported on 03/05/2022) 40 tablet 1   prochlorperazine (COMPAZINE) 10 MG tablet TAKE 1 TABLET(10 MG) BY MOUTH EVERY 6 HOURS AS NEEDED FOR NAUSEA OR VOMITING (Patient not taking: Reported on 03/05/2022) 40 tablet 1   traZODone (DESYREL) 50 MG tablet Take 50 mg by mouth at bedtime. (Patient not taking: Reported on 02/19/2022)     No current facility-administered medications for this visit.      Marland Kitchen  PHYSICAL EXAMINATION: ECOG PERFORMANCE STATUS: 1 - Symptomatic but completely  ambulatory  Vitals:   03/05/22 0903  BP: 127/73  Pulse: 93  Temp: (!) 97 F (36.1 C)  SpO2: 93%    Filed Weights   03/05/22 0903  Weight: 209 lb 3.2 oz (94.9 kg)     Physical Exam Vitals and nursing note reviewed.  HENT:     Head: Normocephalic and atraumatic.     Mouth/Throat:     Pharynx: Oropharynx is clear.  Eyes:     Extraocular Movements: Extraocular movements intact.     Pupils: Pupils are equal, round, and reactive to light.  Cardiovascular:     Rate and Rhythm: Normal rate and regular rhythm.  Pulmonary:     Comments: Decreased breath sounds bilaterally.  Abdominal:     Palpations: Abdomen is soft.  Musculoskeletal:        General: Normal range of motion.     Cervical back: Normal range of motion.  Skin:    General: Skin is warm.  Neurological:     General: No focal deficit present.     Mental Status: He is alert and oriented to person, place, and time.  Psychiatric:        Behavior: Behavior normal.        Judgment: Judgment normal.      LABORATORY DATA:  I have reviewed the data as listed Lab Results  Component Value Date   WBC 3.4 (L) 03/05/2022   HGB 13.4 03/05/2022   HCT 43.4 03/05/2022   MCV 94.1 03/05/2022   PLT 145 (L) 03/05/2022   Recent Labs    02/01/22 1011 02/19/22 1007 02/27/22 0814  NA 137 139 138  K 4.2 3.6 4.3  CL 100 100 102  CO2 _0 GLUCOSE 107* 109* 102*  BUN 9 7* 12  CREATININE 0.85 0.79 0.90  CALCIUM 9.0 8.6* 8.6*  GFRNONAA >60 >60 >60  PROT 7.7 7.3 7.0  ALBUMIN 4.0 3.9 3.8  AST _1 ALT _2 ALKPHOS 89 95 87  BILITOT 0.6 0.5 0.7    RADIOGRAPHIC STUDIES: I have personally reviewed the radiological images as listed and agreed with the findings in the report. IR IMAGING GUIDED PORT INSERTION  Result Date: 02/12/2022 INDICATION: 71 year old male with bilateral lung cancer. He requires durable venous access for concurrent chemo radiation therapy and presents for port catheter placement. EXAM:  IMPLANTED PORT A CATH PLACEMENT WITH ULTRASOUND AND FLUOROSCOPIC GUIDANCE MEDICATIONS: None. ANESTHESIA/SEDATION: Versed 1 mg IV; Fentanyl 50 mcg IV; Moderate Sedation Time:  21 minutes  The patient's vital signs and level of consciousness were continuously monitored during the procedure by the interventional radiology nurse under my direct supervision. FLUOROSCOPY: Radiation exposure index: 4.0 mGy reference air kerma COMPLICATIONS: None immediate. PROCEDURE: The right neck and chest was prepped with chlorhexidine, and draped in the usual sterile fashion using maximum barrier technique (cap and mask, sterile gown, sterile gloves, large sterile sheet, hand hygiene and cutaneous antiseptic). Local anesthesia was attained by infiltration with 1% lidocaine with epinephrine. Ultrasound demonstrated patency of the right internal jugular vein, and this was documented with an image. Under real-time ultrasound guidance, this vein was accessed with a 21 gauge micropuncture needle and image documentation was performed. A small dermatotomy was made at the access site with an 11 scalpel. A 0.018" wire was advanced into the SVC and the access needle exchanged for a 18F micropuncture vascular sheath. The 0.018" wire was then removed and a 0.035" wire advanced into the IVC. An appropriate location for the subcutaneous reservoir was selected below the clavicle and an incision was made through the skin and underlying soft tissues. The subcutaneous tissues were then dissected using a combination of blunt and sharp surgical technique and a pocket was formed. A single lumen power injectable portacatheter was then tunneled through the subcutaneous tissues from the pocket to the dermatotomy and the port reservoir placed within the subcutaneous pocket. The venous access site was then serially dilated and a peel away vascular sheath placed over the wire. The wire was removed and the port catheter advanced into position under fluoroscopic  guidance. The catheter tip is positioned in the superior cavoatrial junction. This was documented with a spot image. The portacatheter was then tested and found to flush and aspirate well. The port was flushed with saline followed by 100 units/mL heparinized saline. The pocket was then closed in two layers using first subdermal inverted interrupted absorbable sutures followed by a running subcuticular suture. The epidermis was then sealed with Dermabond. The dermatotomy at the venous access site was also closed with Dermabond. IMPRESSION: Successful placement of a right IJ approach Power Port with ultrasound and fluoroscopic guidance. The catheter is ready for use. Electronically Signed   By: Jacqulynn Cadet M.D.   On: 02/12/2022 15:15   NM PET Image Initial (PI) Skull Base To Thigh (F-18 FDG)  Result Date: 02/08/2022 CLINICAL DATA:  Initial treatment strategy for lung mass. EXAM: NUCLEAR MEDICINE PET SKULL BASE TO THIGH TECHNIQUE: mCi F-18 FDG was injected intravenously. Full-ring PET imaging was performed from the skull base to thigh after the radiotracer. CT data was obtained and used for attenuation correction and anatomic localization. Fasting blood glucose:  mg/dl COMPARISON:  Chest CT dated May eighth 2023 FINDINGS: Mediastinal blood pool activity: SUV max 2.6 Liver activity: SUV max 3.0 NECK: No hypermetabolic lymph nodes in the neck. Incidental CT findings: none CHEST: Hypermetabolic cavitary mass of the right upper lobe measuring 3.3 cm, SUV max of 28.6. Hypermetabolic left hilar mass versus conglomerate hilar lymphadenopathy measuring approximally 4.4 x 2.3 cm, SUV max of 14.3. Peribronchovascular nodular opacities of the of the bilateral lower lobes and left upper lobe are similar prior exam and demonstrate no significant FDG uptake. Incidental CT findings: Left main and three-vessel coronary artery calcifications. Atherosclerotic disease of the thoracic aorta. Centrilobular emphysema.  ABDOMEN/PELVIS: No abnormal hypermetabolic activity within the liver, pancreas, adrenal glands, or spleen. No hypermetabolic lymph nodes in the abdomen or pelvis. Incidental CT findings: Bilateral simple appearing renal cysts with no FDG activity,  no further follow-up imaging is recommended for these lesions. Nonobstructing right renal stone. Wall thickening of the bladder. Atherosclerotic disease of the abdominal aorta. Mildly enlarged left external iliac lymph node measuring 1.2 cm in short axis on series 2, image 195 with no significant FDG uptake, SUV max of 1.8 SKELETON: No focal hypermetabolic activity to suggest skeletal metastasis. Incidental CT findings: Focal soft tissue FDG uptake adjacent to the right humerus, likely due to tendinitis. IMPRESSION: 1. Hypermetabolic cavitary mass of the right upper lobe, compatible with primary lung malignancy. 2. Hypermetabolic left hilar mass versus conglomerate hilar lymphadenopathy, possibly due to nodal metastatic disease or primary lung malignancy. 3. Peribronchovascular nodular opacities of the of the bilateral lower lobes and left upper lobe are similar prior exam and demonstrate no significant FDG, potentially infectious. Recommend attention on follow-up. 4. Mildly enlarged left external iliac lymph node with no significant FDG uptake, likely reactive. Recommend attention on follow-up. 5. No evidence of distant metastatic disease in the abdomen or pelvis. Electronically Signed   By: Yetta Glassman M.D.   On: 02/08/2022 09:29   MR Brain W Wo Contrast  Result Date: 02/05/2022 CLINICAL DATA:  Metastatic disease evaluation. EXAM: MRI HEAD WITHOUT AND WITH CONTRAST TECHNIQUE: Multiplanar, multiecho pulse sequences of the brain and surrounding structures were obtained without and with intravenous contrast. CONTRAST:  14m GADAVIST GADOBUTROL 1 MMOL/ML IV SOLN COMPARISON:  05/01/2011 FINDINGS: Brain: No restricted diffusion to suggest acute or subacute infarct. No  acute hemorrhage, mass, mass effect, or midline shift. No hydrocephalus or extra-axial collection. No abnormal parenchymal or meningeal enhancement. Scattered T2 hyperintense signal in the periventricular white matter, likely the sequela of minimal chronic small vessel ischemic disease. Normal pituitary. Normal craniocervical junction. Vascular: Patent arterial flow voids. Skull and upper cervical spine: Normal marrow signal. Sinuses/Orbits: Mild mucosal thickening. Dysconjugate gaze. The orbits are otherwise unremarkable. Other: The mastoids are well aerated. IMPRESSION: No acute intracranial process. No evidence of metastatic disease in the brain. Electronically Signed   By: AMerilyn BabaM.D.   On: 02/05/2022 03:51    ASSESSMENT & PLAN:   Malignant neoplasm of upper lobe of right lung (HCC) # Clinical stage III vs Stage IV [left hilar malignancy with contralateral right lung nodule] -right upper lobe lung cancer non-small cell favor-squamous cell; Left Hilar- Bx- squamous cell.  PET MAY 229FA Hypermetabolic cavitary mass of the right upper lobe; Hypermetabolic left hilar mass versus conglomerate hilar lymphadenopathy, possibly due to nodal metastatic disease or primary lung malignancy. Pending-r NGS-F-ONE.  Currently on chemoradiation-CarboTaxol weekly; followed by adjuvant immunotherapy.  Post chemoradiation plan SBRT-right upper lobe mass.  #Proceed with CarboTaxol weekly #3 chemotherapy. Labs today reviewed;  acceptable for treatment today. See below   #Infusion reaction to Taxol with #2- -add Singulair; will plan slower infusion of Taxol "initiated at initial first time rate and was able to tolerate q15 minute rate increases to max rate of 250 cc/hr" discussed with Matt/Pharmacy.   # Constipation- sec to chemo- recommend Miralax once or twice as needed.   # Smoking: Active smoker; quit smoking recently. On  nicotine patches.  # COPD: Stable- on proair- continue inhalers as per  pulmonary.  #Diabetes-monitor closely on chemotherapy. Recommend BG/alncets; strips 2 or 3 times a day  #Handicapped sticker: signed.   # DISPOSITION: # chemo today- carboTaxol weekly.  # in 1 week-labs- cbc/cmp; carbo-taxol # Follow up in 2 weeks [[id number] to double book in AM]- MD- labs- cbc/cmp; carbo-taxol weekly- Dr.B  All questions were answered. The patient knows to call the clinic with any problems, questions or concerns.       Cammie Sickle, MD 03/05/2022 9:27 AM

## 2022-03-05 NOTE — Progress Notes (Signed)
3rd cycle Taxol started at 1111 at 63 ml per hour x 15 minutes, due to patient having a reaction last time. Patient started coughing and complaining of chest tightness at 1125, Taxol Stopped and NS started at 999 ml per hr. Beckey Rutter, NP notified and was at chairside at 1127. Solumedrol given at 1129 per NP. Vital signs monitored throughout incident. Patient returned to baseline around 1200. Taxol discarded per NP. Carboplatin started at 1210 and finished without incident. Patient discharged to home in stable condition.

## 2022-03-05 NOTE — Addendum Note (Signed)
Addended by: Charlaine Dalton R on: 03/05/2022 12:10 PM   Modules accepted: Orders

## 2022-03-05 NOTE — Progress Notes (Signed)
Ankles swelling lt>rt, x2 weeks.

## 2022-03-05 NOTE — Telephone Encounter (Signed)
Per secure chat with Dr. Jacinto Reap: I switched from Taxol to Abraxane.  BC-please schedule follow-up in 1 week; ok to double book; MD; labs-as ordered; carbo-Abraxane weekly; cancel Taxol. in 2 weeks-as planned; except cancel Taxol; instead add Abraxane. Thanks  Changes made to medications per Beckey Rutter: Patient was having chest pains and increased bp with taxol.   Patient scheduled

## 2022-03-05 NOTE — Assessment & Plan Note (Addendum)
#   Clinical stage III vs Stage IV [left hilar malignancy with contralateral right lung nodule] -right upper lobe lung cancer non-small cell favor-squamous cell; Left Hilar- Bx- squamous cell.  PET MAY 40JW- Hypermetabolic cavitary mass of the right upper lobe; Hypermetabolic left hilar mass versus conglomerate hilar lymphadenopathy, possibly due to nodal metastatic disease or primary lung malignancy. Pending-r NGS-F-ONE.  Currently on chemoradiation-CarboTaxol weekly; followed by adjuvant immunotherapy.  Post chemoradiation plan SBRT-right upper lobe mass.  #Proceed with CarboTaxol weekly #3 chemotherapy. Labs today reviewed;  acceptable for treatment today. See below   #Infusion reaction to Taxol with #2- -add Singulair; will plan slower infusion of Taxol "initiated at initial first time rate and was able to tolerate q15 minute rate increases to max rate of 250 cc/hr" discussed with Matt/Pharmacy.   # Constipation- sec to chemo- recommend Miralax once or twice as needed.   # Smoking: Active smoker; quit smoking recently. On  nicotine patches.  # COPD: Stable- on proair- continue inhalers as per pulmonary.  #Diabetes-monitor closely on chemotherapy. Recommend BG/alncets; strips 2 or 3 times a day   # DISPOSITION: # chemo today- carboTaxol weekly.  # in 1 week-labs- cbc/cmp; carbo-taxol # Follow up in 2 weeks [july5th-ok to double book in AM]- MD- labs- cbc/cmp; carbo-taxol weekly- Dr.B  Addendum: on 6/20-patient had repeated "infusion reaction" to Taxol-in spite of premedication-steroids Singulair dexamethasone Pepcid.  Will discontinue Taxol.  Start patient on Abraxane starting 6/27.

## 2022-03-06 ENCOUNTER — Other Ambulatory Visit: Payer: Medicare Other

## 2022-03-06 ENCOUNTER — Other Ambulatory Visit: Payer: Self-pay

## 2022-03-06 ENCOUNTER — Ambulatory Visit
Admission: RE | Admit: 2022-03-06 | Discharge: 2022-03-06 | Disposition: A | Discharge status: Home / Self Care | Payer: Medicare Other | Source: Ambulatory Visit | Attending: Radiation Oncology | Admitting: Radiation Oncology

## 2022-03-06 DIAGNOSIS — C3402 Malignant neoplasm of left main bronchus: Secondary | ICD-10-CM | POA: Diagnosis not present

## 2022-03-06 LAB — RAD ONC ARIA SESSION SUMMARY
Course Elapsed Days: 14
Plan Fractions Treated to Date: 11
Plan Prescribed Dose Per Fraction: 2 Gy
Plan Total Fractions Prescribed: 35
Plan Total Prescribed Dose: 70 Gy
Reference Point Dosage Given to Date: 22 Gy
Reference Point Session Dosage Given: 2 Gy
Session Number: 11

## 2022-03-07 ENCOUNTER — Other Ambulatory Visit: Payer: Self-pay

## 2022-03-07 ENCOUNTER — Ambulatory Visit
Admission: RE | Admit: 2022-03-07 | Discharge: 2022-03-07 | Disposition: A | Discharge status: Home / Self Care | Payer: Medicare Other | Source: Ambulatory Visit | Attending: Radiation Oncology | Admitting: Radiation Oncology

## 2022-03-07 DIAGNOSIS — C3402 Malignant neoplasm of left main bronchus: Secondary | ICD-10-CM | POA: Diagnosis not present

## 2022-03-07 LAB — RAD ONC ARIA SESSION SUMMARY
Course Elapsed Days: 15
Plan Fractions Treated to Date: 12
Plan Prescribed Dose Per Fraction: 2 Gy
Plan Total Fractions Prescribed: 35
Plan Total Prescribed Dose: 70 Gy
Reference Point Dosage Given to Date: 24 Gy
Reference Point Session Dosage Given: 2 Gy
Session Number: 12

## 2022-03-08 ENCOUNTER — Ambulatory Visit
Admission: RE | Admit: 2022-03-08 | Discharge: 2022-03-08 | Disposition: A | Discharge status: Home / Self Care | Payer: Medicare Other | Source: Ambulatory Visit | Attending: Radiation Oncology | Admitting: Radiation Oncology

## 2022-03-08 ENCOUNTER — Other Ambulatory Visit: Payer: Self-pay

## 2022-03-08 DIAGNOSIS — C3402 Malignant neoplasm of left main bronchus: Secondary | ICD-10-CM | POA: Diagnosis not present

## 2022-03-08 LAB — RAD ONC ARIA SESSION SUMMARY
Course Elapsed Days: 16
Plan Fractions Treated to Date: 13
Plan Prescribed Dose Per Fraction: 2 Gy
Plan Total Fractions Prescribed: 35
Plan Total Prescribed Dose: 70 Gy
Reference Point Dosage Given to Date: 26 Gy
Reference Point Session Dosage Given: 2 Gy
Session Number: 13

## 2022-03-11 ENCOUNTER — Other Ambulatory Visit: Payer: Self-pay

## 2022-03-11 ENCOUNTER — Ambulatory Visit
Admission: RE | Admit: 2022-03-11 | Discharge: 2022-03-11 | Disposition: A | Discharge status: Home / Self Care | Payer: Medicare Other | Source: Ambulatory Visit | Attending: Radiation Oncology | Admitting: Radiation Oncology

## 2022-03-11 DIAGNOSIS — C3402 Malignant neoplasm of left main bronchus: Secondary | ICD-10-CM | POA: Diagnosis not present

## 2022-03-11 LAB — RAD ONC ARIA SESSION SUMMARY
Course Elapsed Days: 19
Plan Fractions Treated to Date: 14
Plan Prescribed Dose Per Fraction: 2 Gy
Plan Total Fractions Prescribed: 35
Plan Total Prescribed Dose: 70 Gy
Reference Point Dosage Given to Date: 28 Gy
Reference Point Session Dosage Given: 2 Gy
Session Number: 14

## 2022-03-12 ENCOUNTER — Inpatient Hospital Stay: Payer: Medicare Other

## 2022-03-12 ENCOUNTER — Other Ambulatory Visit: Payer: Medicare Other

## 2022-03-12 ENCOUNTER — Other Ambulatory Visit: Payer: Self-pay

## 2022-03-12 ENCOUNTER — Inpatient Hospital Stay (HOSPITAL_BASED_OUTPATIENT_CLINIC_OR_DEPARTMENT_OTHER): Payer: Medicare Other | Admitting: Internal Medicine

## 2022-03-12 ENCOUNTER — Ambulatory Visit
Admission: RE | Admit: 2022-03-12 | Discharge: 2022-03-12 | Disposition: A | Discharge status: Home / Self Care | Payer: Medicare Other | Source: Ambulatory Visit | Attending: Radiation Oncology | Admitting: Radiation Oncology

## 2022-03-12 ENCOUNTER — Ambulatory Visit: Payer: Medicare Other

## 2022-03-12 ENCOUNTER — Encounter: Payer: Self-pay | Admitting: Internal Medicine

## 2022-03-12 DIAGNOSIS — C3411 Malignant neoplasm of upper lobe, right bronchus or lung: Secondary | ICD-10-CM

## 2022-03-12 DIAGNOSIS — C3402 Malignant neoplasm of left main bronchus: Secondary | ICD-10-CM | POA: Diagnosis not present

## 2022-03-12 LAB — COMPREHENSIVE METABOLIC PANEL
ALT: 19 U/L (ref 0–44)
AST: 18 U/L (ref 15–41)
Albumin: 3.7 g/dL (ref 3.5–5.0)
Alkaline Phosphatase: 78 U/L (ref 38–126)
Anion gap: 7 (ref 5–15)
BUN: 10 mg/dL (ref 8–23)
CO2: 27 mmol/L (ref 22–32)
Calcium: 8.4 mg/dL — ABNORMAL LOW (ref 8.9–10.3)
Chloride: 102 mmol/L (ref 98–111)
Creatinine, Ser: 0.97 mg/dL (ref 0.61–1.24)
GFR, Estimated: >60 mL/min (ref >60)
Glucose, Bld: 107 mg/dL — ABNORMAL HIGH (ref 70–99)
Potassium: 3.9 mmol/L (ref 3.5–5.1)
Sodium: 136 mmol/L (ref 135–145)
Total Bilirubin: 0.4 mg/dL (ref 0.3–1.2)
Total Protein: 7 g/dL (ref 6.5–8.1)

## 2022-03-12 LAB — CBC WITH DIFFERENTIAL/PLATELET
Abs Immature Granulocytes: 0 10*3/uL (ref 0.00–0.07)
Basophils Absolute: 0 10*3/uL (ref 0.0–0.1)
Basophils Relative: 1 %
Eosinophils Absolute: 0.1 10*3/uL (ref 0.0–0.5)
Eosinophils Relative: 2 %
HCT: 44.5 % (ref 39.0–52.0)
Hemoglobin: 14 g/dL (ref 13.0–17.0)
Immature Granulocytes: 0 %
Lymphocytes Relative: 19 %
Lymphs Abs: 0.7 10*3/uL (ref 0.7–4.0)
MCH: 29.4 pg (ref 26.0–34.0)
MCHC: 31.5 g/dL (ref 30.0–36.0)
MCV: 93.5 fL (ref 80.0–100.0)
Monocytes Absolute: 0.6 10*3/uL (ref 0.1–1.0)
Monocytes Relative: 17 %
Neutro Abs: 2.3 10*3/uL (ref 1.7–7.7)
Neutrophils Relative %: 61 %
Platelets: 169 10*3/uL (ref 150–400)
RBC: 4.76 MIL/uL (ref 4.22–5.81)
RDW: 13.5 % (ref 11.5–15.5)
WBC: 3.7 10*3/uL — ABNORMAL LOW (ref 4.0–10.5)
nRBC: 0 % (ref 0.0–0.2)

## 2022-03-12 LAB — RAD ONC ARIA SESSION SUMMARY
Course Elapsed Days: 20
Plan Fractions Treated to Date: 15
Plan Prescribed Dose Per Fraction: 2 Gy
Plan Total Fractions Prescribed: 35
Plan Total Prescribed Dose: 70 Gy
Reference Point Dosage Given to Date: 30 Gy
Reference Point Session Dosage Given: 2 Gy
Session Number: 15

## 2022-03-12 MED ORDER — DIPHENHYDRAMINE HCL 50 MG/ML IJ SOLN: 50.0000 mg | Freq: Once | INTRAMUSCULAR | Status: DC

## 2022-03-12 MED ORDER — FAMOTIDINE IN NACL 20-0.9 MG/50ML-% IV SOLN
20.0000 mg | Freq: Once | INTRAVENOUS | Status: AC
  Administered 2022-03-12: 20 mg via INTRAVENOUS
  Filled 2022-03-12: qty 50

## 2022-03-12 MED ORDER — PACLITAXEL PROTEIN-BOUND CHEMO INJECTION 100 MG
100.0000 mg/m2 | Freq: Once | INTRAVENOUS | Status: AC
  Administered 2022-03-12: 225 mg via INTRAVENOUS
  Filled 2022-03-12: qty 45

## 2022-03-12 MED ORDER — SODIUM CHLORIDE 0.9 % IV SOLN
231.6000 mg | Freq: Once | INTRAVENOUS | Status: AC
  Administered 2022-03-12: 230 mg via INTRAVENOUS
  Filled 2022-03-12: qty 23

## 2022-03-12 MED ORDER — SODIUM CHLORIDE 0.9 % IV SOLN
Freq: Once | INTRAVENOUS | Status: AC
  Filled 2022-03-12: qty 250

## 2022-03-12 MED ORDER — MONTELUKAST SODIUM 10 MG PO TABS: 10.0000 mg | ORAL_TABLET | Freq: Every day | ORAL | Status: DC

## 2022-03-12 MED ORDER — HEPARIN SOD (PORK) LOCK FLUSH 100 UNIT/ML IV SOLN
500.0000 [IU] | Freq: Once | INTRAVENOUS | Status: AC | PRN
  Administered 2022-03-12: 500 [IU]
  Filled 2022-03-12: qty 5

## 2022-03-12 MED ORDER — PALONOSETRON HCL INJECTION 0.25 MG/5ML
0.2500 mg | Freq: Once | INTRAVENOUS | Status: AC
  Administered 2022-03-12: 0.25 mg via INTRAVENOUS
  Filled 2022-03-12: qty 5

## 2022-03-12 MED ORDER — SODIUM CHLORIDE 0.9 % IV SOLN
10.0000 mg | Freq: Once | INTRAVENOUS | Status: AC
  Administered 2022-03-12: 10 mg via INTRAVENOUS
  Filled 2022-03-12: qty 10

## 2022-03-12 NOTE — Progress Notes (Signed)
Patient reports occasional epigastric pain since last treatment.  The last episode was 2 days ago and took an ASA 81 mg that improved pain.  For the past 2 days having groin pain/discomfort with frequent urination.

## 2022-03-12 NOTE — Progress Notes (Signed)
[facility] CONSULT NOTE  Patient Care Team: [facility] as PCP - General Glory Buff, RN as Oncology Nurse Navigator Earna Coder, MD as Consulting Physician (Oncology)  CHIEF COMPLAINTS/PURPOSE OF CONSULTATION: lung cancer  #  Oncology History Overview Note  IMPRESSION: 1. Thick-walled cavitary lesion of the posterior right upper lobe abutting the major fissure measuring 3.3 x 3.3 cm, highly concerning for primary lung malignancy or metastasis. 2. Although evaluation is limited by lack of intravenous contrast, suspect significant left hilar lymphadenopathy or mass, with apparent obstruction of the left upper lobe bronchus and severe narrowing of the left lower lobe bronchus. This finding is likewise highly concerning for primary lung malignancy or alternately nodal metastasis. Contrast enhanced CT may be helpful to more clearly evaluate the hilum. 3. There may be additional right hilar lymphadenopathy, again assessment limited by noncontrast CT. 4. Scattered heterogeneous and ground-glass airspace opacity throughout the upper lobes and right lower lobe, possibly reflecting postobstructive airspace disease in the left lung generally nonspecific. 5. Emphysema. 6. Coronary artery disease.   Aortic Atherosclerosis (ICD10-I70.0) and Emphysema (ICD10-J43.9).     Electronically Signed   By: Jearld Lesch M.D.   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 [Dr.Fleming/Dr.Aleskerov]  # LEFT HILAR- June 6th, 2023-  chemo-RT- plan SBRT of RUL nodule  # JUNE 14th-carbo-Taxol; 6/20 Taxol discontinued [acute infusion reaction]; 6/27-carbo Abraxane weekly  # CAD; COPD [Dr.Fleming- O2 none now]; DM; HTN   Malignant neoplasm of upper lobe of right lung (HCC)  02/01/2022 Initial Diagnosis   Malignant neoplasm of upper lobe of right lung (HCC)   02/01/2022 Cancer Staging   Staging form: Lung, AJCC 8th Edition -  Clinical: Stage IIIB (cT2a, cN3, cM0) - Signed by Earna Coder, MD on 02/01/2022   02/19/2022 -  Chemotherapy   Patient is on Treatment Plan : LUNG Carboplatin / Paclitaxel + XRT q7d        HISTORY OF PRESENTING ILLNESS: Alone.  Ambulating independently.  Louis Sullivan 71 y.o.  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  Patient had again infusion reaction to Taxol last week-in spite of running the Taxol slowly..  Patient received Solu-Medrol and Benadryl.    Patient complains of reflux-like symptoms epigastric pain.  Patient denies any worsening shortness of breath or cough. Patient has chronic mild shortness of breath.  Chronic mild cough not any worse.  He has been using his inhalers ProAir.  Denies any headaches or vision changes.  Review of Systems  Constitutional:  Negative for chills, diaphoresis, fever, malaise/fatigue and weight loss.  HENT:  Negative for nosebleeds and sore throat.   Eyes:  Negative for double vision.  Respiratory:  Positive for cough and shortness of breath. Negative for hemoptysis, sputum production and wheezing.   Cardiovascular:  Negative for chest pain, palpitations, orthopnea and leg swelling.  Gastrointestinal:  Negative for abdominal pain, blood in stool, constipation, diarrhea, heartburn, melena, nausea and vomiting.  Genitourinary:  Negative for dysuria, frequency and urgency.  Musculoskeletal:  Positive for joint pain. Negative for back pain.  Skin: Negative.  Negative for itching and rash.  Neurological:  Negative for dizziness, tingling, focal weakness, weakness and headaches.  Endo/Heme/Allergies:  Does not bruise/bleed easily.  Psychiatric/Behavioral:  Negative for depression. The patient is not nervous/anxious and does not have insomnia.  MEDICAL HISTORY:  Past Medical History:  Diagnosis Date   COPD  (chronic obstructive pulmonary disease) (HCC)    Coronary artery disease    1 stent placed in about 2008   Diabetes mellitus without complication (HCC)    Dyspnea    Headache    migraines   Hyperlipidemia    Hypertension    Pneumonia 12/2021   hospitalized at [facility]    SURGICAL HISTORY: Past Surgical History:  Procedure Laterality Date   CARDIAC CATHETERIZATION     COLONOSCOPY     IR IMAGING GUIDED PORT INSERTION  02/12/2022   VIDEO BRONCHOSCOPY WITH ENDOBRONCHIAL NAVIGATION N/A 01/23/2022   Procedure: VIDEO BRONCHOSCOPY WITH ENDOBRONCHIAL NAVIGATION;  Surgeon: Vida Rigger, MD;  Location: [facility] ORS;  Service: Thoracic;  Laterality: N/A;   VIDEO BRONCHOSCOPY WITH ENDOBRONCHIAL ULTRASOUND N/A 01/23/2022   Procedure: VIDEO BRONCHOSCOPY WITH ENDOBRONCHIAL ULTRASOUND;  Surgeon: Vida Rigger, MD;  Location: [facility] ORS;  Service: Thoracic;  Laterality: N/A;    SOCIAL HISTORY: Social History   Socioeconomic History   Marital status: Widowed    Spouse name: Not on file   Number of children: Not on file   Years of education: Not on file   Highest education level: Not on file  Occupational History   Not on file  Tobacco Use   Smoking status: Former    Packs/day: 1.00    Years: 20.00    Total pack years: 20.00    Types: Cigarettes    Quit date: 02/11/2022    Years since quitting: 0.0   Smokeless tobacco: Never   Tobacco comments:    Request to MD for Nicotine patch to help him quit smoking.  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 Lives in [place]. Son passed in 2021;MI wife passed away in 1990s/aneurysm; daughter in [place].    Social Determinants of Health   Financial Resource Strain: Not on file  Food Insecurity: Not on file  Transportation Needs:  Not on file  Physical Activity: Not on file  Stress: Not on file  Social Connections: Not on file  Intimate Partner Violence: Not on file    FAMILY HISTORY: Family History  Problem Relation Age of Onset   Throat cancer Brother     ALLERGIES:  is allergic to paclitaxel.  MEDICATIONS:  Current Outpatient Medications  Medication Sig Dispense Refill   albuterol (PROVENTIL) (5 MG/ML) 0.5% nebulizer solution Take 2.5 mg by nebulization every morning.     albuterol (VENTOLIN HFA) 108 (90 Base) MCG/ACT inhaler Inhale 2 puffs into the lungs every 4 (four) hours as needed for wheezing or shortness of breath.     aspirin EC 81 MG tablet Take 81 mg by mouth daily.     atorvastatin (LIPITOR) 40 MG tablet Take 40 mg by mouth every morning.     blood glucose meter kit and supplies KIT Dispense based on patient and insurance preference. Use up to four times daily as directed. 1 each 0   Cholecalciferol (VITAMIN D3 PO) Take 1 tablet by mouth daily at 6 (six) AM.     lidocaine-prilocaine (EMLA) cream Apply on the port. 30 -45 min  prior to port access. 30 g 3   losartan (COZAAR) 100 MG tablet Take 100  mg by mouth every evening.     losartan (COZAAR) 50 MG tablet Take 50 mg by mouth in the morning.     metFORMIN (GLUCOPHAGE) 1000 MG tablet Take 1,000 mg by mouth daily with breakfast.     OXYGEN Inhale 2 L into the lungs at bedtime.     Nicotine 21-14-7 MG/24HR KIT Place 1 patch onto the skin daily. (Patient not taking: Reported on 03/12/2022) 60 kit 1   ondansetron (ZOFRAN) 8 MG tablet One pill every 8 hours as needed for nausea/vomitting. (Patient not taking: Reported on 03/05/2022) 40 tablet 1   prochlorperazine (COMPAZINE) 10 MG tablet TAKE 1 TABLET(10 MG) BY MOUTH EVERY 6 HOURS AS NEEDED FOR NAUSEA OR VOMITING (Patient not taking: Reported on 03/05/2022) 40 tablet 1   traZODone (DESYREL) 50 MG tablet Take 50 mg by mouth at bedtime. (Patient not taking: Reported on 02/19/2022)     No current  facility-administered medications for this visit.   Facility-Administered Medications Ordered in Other Visits  Medication Dose Route Frequency Provider Last Rate Last Admin   CARBOplatin (PARAPLATIN) 230 mg in sodium chloride 0.9 % 100 mL chemo infusion  230 mg Intravenous Once Louretta Shorten R, MD       famotidine (PEPCID) IVPB 20 mg premix  20 mg Intravenous Once Louretta Shorten R, MD 200 mL/hr at 03/12/22 1103 20 mg at 03/12/22 1103   heparin lock flush 100 unit/mL  500 Units Intracatheter Once PRN Earna Coder, MD       PACLitaxel-protein bound (ABRAXANE) chemo infusion 225 mg  100 mg/m2 (Treatment Plan Recorded) Intravenous Once Earna Coder, MD          .  PHYSICAL EXAMINATION: ECOG PERFORMANCE STATUS: 1 - Symptomatic but completely ambulatory  Vitals:   03/12/22 1000  BP: (!) 150/79  Pulse: 65  Resp: 20  Temp: (!) 96.6 F (35.9 C)  SpO2: 95%    Filed Weights   03/12/22 1000  Weight: 208 lb (94.3 kg)     Physical Exam Vitals and nursing note reviewed.  HENT:     Head: Normocephalic and atraumatic.     Mouth/Throat:     Pharynx: Oropharynx is clear.  Eyes:     Extraocular Movements: Extraocular movements intact.     Pupils: Pupils are equal, round, and reactive to light.  Cardiovascular:     Rate and Rhythm: Normal rate and regular rhythm.  Pulmonary:     Comments: Decreased breath sounds bilaterally.  Abdominal:     Palpations: Abdomen is soft.  Musculoskeletal:        General: Normal range of motion.     Cervical back: Normal range of motion.  Skin:    General: Skin is warm.  Neurological:     General: No focal deficit present.     Mental Status: He is alert and oriented to person, place, and time.  Psychiatric:        Behavior: Behavior normal.        Judgment: Judgment normal.      LABORATORY DATA:  I have reviewed the data as listed Lab Results  Component Value Date   WBC 3.7 (L) 03/12/2022   HGB 14.0 03/12/2022    HCT 44.5 03/12/2022   MCV 93.5 03/12/2022   PLT 169 03/12/2022   Recent Labs    02/27/22 0814 03/05/22 0905 03/12/22 0911  NA 138 139 136  K 4.3 4.2 3.9  CL 102 104 102  CO2 30 29 27   GLUCOSE  102* 106* 107*  BUN 12 9 10   CREATININE 0.90 0.82 0.97  CALCIUM 8.6* 8.6* 8.4*  GFRNONAA >60 >60 >60  PROT 7.0 6.8 7.0  ALBUMIN 3.8 3.5 3.7  AST 17 17 18   ALT 14 17 19   ALKPHOS 87 77 78  BILITOT 0.7 0.7 0.4    RADIOGRAPHIC STUDIES: I have personally reviewed the radiological images as listed and agreed with the findings in the report. IR IMAGING GUIDED PORT INSERTION  Result Date: 02/12/2022 INDICATION: 71 year old male with bilateral lung cancer. He requires durable venous access for concurrent chemo radiation therapy and presents for port catheter placement. EXAM: IMPLANTED PORT A CATH PLACEMENT WITH ULTRASOUND AND FLUOROSCOPIC GUIDANCE MEDICATIONS: None. ANESTHESIA/SEDATION: Versed 1 mg IV; Fentanyl 50 mcg IV; Moderate Sedation Time:  21 minutes The patient's vital signs and level of consciousness were continuously monitored during the procedure by the interventional radiology nurse under my direct supervision. FLUOROSCOPY: Radiation exposure index: 4.0 mGy reference air kerma COMPLICATIONS: None immediate. PROCEDURE: The right neck and chest was prepped with chlorhexidine, and draped in the usual sterile fashion using maximum barrier technique (cap and mask, sterile gown, sterile gloves, large sterile sheet, hand hygiene and cutaneous antiseptic). Local anesthesia was attained by infiltration with 1% lidocaine with epinephrine. Ultrasound demonstrated patency of the right internal jugular vein, and this was documented with an image. Under real-time ultrasound guidance, this vein was accessed with a 21 gauge micropuncture needle and image documentation was performed. A small dermatotomy was made at the access site with an 11 scalpel. A 0.018" wire was advanced into the SVC and the access  needle exchanged for a 78F micropuncture vascular sheath. The 0.018" wire was then removed and a 0.035" wire advanced into the IVC. An appropriate location for the subcutaneous reservoir was selected below the clavicle and an incision was made through the skin and underlying soft tissues. The subcutaneous tissues were then dissected using a combination of blunt and sharp surgical technique and a pocket was formed. A single lumen power injectable portacatheter was then tunneled through the subcutaneous tissues from the pocket to the dermatotomy and the port reservoir placed within the subcutaneous pocket. The venous access site was then serially dilated and a peel away vascular sheath placed over the wire. The wire was removed and the port catheter advanced into position under fluoroscopic guidance. The catheter tip is positioned in the superior cavoatrial junction. This was documented with a spot image. The portacatheter was then tested and found to flush and aspirate well. The port was flushed with saline followed by 100 units/mL heparinized saline. The pocket was then closed in two layers using first subdermal inverted interrupted absorbable sutures followed by a running subcuticular suture. The epidermis was then sealed with Dermabond. The dermatotomy at the venous access site was also closed with Dermabond. IMPRESSION: Successful placement of a right IJ approach Power Port with ultrasound and fluoroscopic guidance. The catheter is ready for use. Electronically Signed   By: Malachy Moan M.D.   On: 02/12/2022 15:15    ASSESSMENT & PLAN:   Malignant neoplasm of upper lobe of right lung (HCC) # Clinical stage III vs Stage IV [left hilar malignancy with contralateral right lung nodule] -right upper lobe lung cancer non-small cell favor-squamous cell; Left Hilar- Bx- squamous cell.  PET MAY 25th- Hypermetabolic cavitary mass of the right upper lobe; Hypermetabolic left hilar mass versus conglomerate hilar  lymphadenopathy, possibly due to nodal metastatic disease or primary lung malignancy. Pending-r NGS-F-ONE.  Currently  on chemoradiation-CarboTaxol weekly; followed by adjuvant immunotherapy.  Post chemoradiation plan SBRT-right upper lobe mass.  #Proceed with Carbo- Abraxane with RT [July 26th, 2023; Taxol:allergy] weekly #4 chemotherapy. Labs today reviewed;  acceptable for treatment today. See below   # ON 6/20-patient had repeated "infusion reaction" to Taxol-in spite of premedication-steroids Singulair dexamethasone Pepcid.  Will discontinue Taxol.  Start patient on Abraxane starting 6/27.   # Gastritis: #Recommend Pepcid over-the-counter-1 a day in the morning; take 30 minutes an hour before breakfast.  And also use Tums over-the-counter.  # Constipation- sec to chemo- recommend Miralax once or twice as needed. STABLE.   # Smoking: Active smoker; quit smoking recently. OFF  nicotine patches. STABLE.   # COPD: on proair- continue inhalers as per pulmonary.Stable-    #Diabetes-monitor closely on chemotherapy. Recommend BG/alncets; strips 2 or 3 times a day.   # DISPOSITION: # chemo today- carbo-Abarxane  weekly.  # JULY 5th- labs- cbc/cmp; carbo-abarxane weekly; CANCEL MD appt  # in 3 weeks- MD-labs/- cbc/cmp; carbo-abraxane-  # in 4 weeks-=-labs/- cbc/cmp; carbo-abraxane- Dr.B          All questions were answered. The patient knows to call the clinic with any problems, questions or concerns.       Earna Coder, MD 03/12/2022 11:10 AM

## 2022-03-12 NOTE — Assessment & Plan Note (Addendum)
#   Clinical stage III vs Stage IV [left hilar malignancy with contralateral right lung nodule] -right upper lobe lung cancer non-small cell favor-squamous cell; Left Hilar- Bx- squamous cell.  PET MAY 25th- Hypermetabolic cavitary mass of the right upper lobe; Hypermetabolic left hilar mass versus conglomerate hilar lymphadenopathy, possibly due to nodal metastatic disease or primary lung malignancy. Pending-r NGS-F-ONE.  Currently on chemoradiation-CarboTaxol weekly; followed by adjuvant immunotherapy.  Post chemoradiation plan SBRT-right upper lobe mass.  #Proceed with Carbo- Abraxane with RT [July 26th, 2023; Taxol:allergy] weekly #4 chemotherapy. Labs today reviewed;  acceptable for treatment today. See below   # ON 6/20-patient had repeated "infusion reaction" to Taxol-in spite of premedication-steroids Singulair dexamethasone Pepcid.  Will discontinue Taxol.  Start patient on Abraxane starting 6/27.   # Gastritis: #Recommend Pepcid over-the-counter-1 a day in the morning; take 30 minutes an hour before breakfast.  And also use Tums over-the-counter.  # Constipation- sec to chemo- recommend Miralax once or twice as needed. STABLE.   # Smoking: Active smoker; quit smoking recently. OFF  nicotine patches. STABLE.   # COPD: on proair- continue inhalers as per pulmonary.Stable-    #Diabetes-monitor closely on chemotherapy. Recommend BG/alncets; strips 2 or 3 times a day.   # DISPOSITION: # chemo today- carbo-Abarxane  weekly.  # JULY 5th- labs- cbc/cmp; carbo-abarxane weekly; CANCEL MD appt  # in 3 weeks- MD-labs/- cbc/cmp; carbo-abraxane-  # in 4 weeks-=-labs/- cbc/cmp; carbo-abraxane- Dr.B

## 2022-03-13 ENCOUNTER — Other Ambulatory Visit: Payer: Self-pay

## 2022-03-13 ENCOUNTER — Ambulatory Visit
Admission: RE | Admit: 2022-03-13 | Discharge: 2022-03-13 | Disposition: A | Discharge status: Home / Self Care | Payer: Medicare Other | Source: Ambulatory Visit | Attending: Radiation Oncology | Admitting: Radiation Oncology

## 2022-03-13 ENCOUNTER — Other Ambulatory Visit: Payer: Self-pay | Admitting: Internal Medicine

## 2022-03-13 DIAGNOSIS — C3402 Malignant neoplasm of left main bronchus: Secondary | ICD-10-CM | POA: Diagnosis not present

## 2022-03-13 LAB — RAD ONC ARIA SESSION SUMMARY
Course Elapsed Days: 21
Plan Fractions Treated to Date: 16
Plan Prescribed Dose Per Fraction: 2 Gy
Plan Total Fractions Prescribed: 35
Plan Total Prescribed Dose: 70 Gy
Reference Point Dosage Given to Date: 32 Gy
Reference Point Session Dosage Given: 2 Gy
Session Number: 16

## 2022-03-14 ENCOUNTER — Other Ambulatory Visit: Payer: Self-pay

## 2022-03-14 ENCOUNTER — Ambulatory Visit
Admission: RE | Admit: 2022-03-14 | Discharge: 2022-03-14 | Disposition: A | Discharge status: Home / Self Care | Payer: Medicare Other | Source: Ambulatory Visit | Attending: Radiation Oncology | Admitting: Radiation Oncology

## 2022-03-14 DIAGNOSIS — C3402 Malignant neoplasm of left main bronchus: Secondary | ICD-10-CM | POA: Diagnosis not present

## 2022-03-14 LAB — RAD ONC ARIA SESSION SUMMARY
Course Elapsed Days: 22
Plan Fractions Treated to Date: 17
Plan Prescribed Dose Per Fraction: 2 Gy
Plan Total Fractions Prescribed: 35
Plan Total Prescribed Dose: 70 Gy
Reference Point Dosage Given to Date: 34 Gy
Reference Point Session Dosage Given: 2 Gy
Session Number: 17

## 2022-03-15 ENCOUNTER — Other Ambulatory Visit: Payer: Self-pay

## 2022-03-15 ENCOUNTER — Ambulatory Visit
Admission: RE | Admit: 2022-03-15 | Discharge: 2022-03-15 | Disposition: A | Discharge status: Home / Self Care | Payer: Medicare Other | Source: Ambulatory Visit | Attending: Radiation Oncology | Admitting: Radiation Oncology

## 2022-03-15 DIAGNOSIS — C3402 Malignant neoplasm of left main bronchus: Secondary | ICD-10-CM | POA: Diagnosis not present

## 2022-03-15 LAB — RAD ONC ARIA SESSION SUMMARY
Course Elapsed Days: 23
Plan Fractions Treated to Date: 18
Plan Prescribed Dose Per Fraction: 2 Gy
Plan Total Fractions Prescribed: 35
Plan Total Prescribed Dose: 70 Gy
Reference Point Dosage Given to Date: 36 Gy
Reference Point Session Dosage Given: 2 Gy
Session Number: 18

## 2022-03-16 ENCOUNTER — Ambulatory Visit: Payer: Medicare Other

## 2022-03-18 ENCOUNTER — Ambulatory Visit
Admission: RE | Admit: 2022-03-18 | Discharge: 2022-03-18 | Disposition: A | Discharge status: Home / Self Care | Payer: Medicare Other | Source: Ambulatory Visit | Attending: Radiation Oncology | Admitting: Radiation Oncology

## 2022-03-18 ENCOUNTER — Other Ambulatory Visit: Payer: Self-pay

## 2022-03-18 DIAGNOSIS — C3412 Malignant neoplasm of upper lobe, left bronchus or lung: Secondary | ICD-10-CM | POA: Insufficient documentation

## 2022-03-18 DIAGNOSIS — Z79899 Other long term (current) drug therapy: Secondary | ICD-10-CM | POA: Insufficient documentation

## 2022-03-18 DIAGNOSIS — C3411 Malignant neoplasm of upper lobe, right bronchus or lung: Secondary | ICD-10-CM | POA: Diagnosis present

## 2022-03-18 DIAGNOSIS — Z87891 Personal history of nicotine dependence: Secondary | ICD-10-CM | POA: Insufficient documentation

## 2022-03-18 DIAGNOSIS — C3402 Malignant neoplasm of left main bronchus: Secondary | ICD-10-CM | POA: Insufficient documentation

## 2022-03-18 DIAGNOSIS — L309 Dermatitis, unspecified: Secondary | ICD-10-CM | POA: Insufficient documentation

## 2022-03-18 DIAGNOSIS — Z5111 Encounter for antineoplastic chemotherapy: Secondary | ICD-10-CM | POA: Diagnosis not present

## 2022-03-18 LAB — RAD ONC ARIA SESSION SUMMARY
Course Elapsed Days: 26
Plan Fractions Treated to Date: 19
Plan Prescribed Dose Per Fraction: 2 Gy
Plan Total Fractions Prescribed: 35
Plan Total Prescribed Dose: 70 Gy
Reference Point Dosage Given to Date: 38 Gy
Reference Point Session Dosage Given: 2 Gy
Session Number: 19

## 2022-03-20 ENCOUNTER — Inpatient Hospital Stay: Payer: Medicare Other

## 2022-03-20 ENCOUNTER — Other Ambulatory Visit: Payer: Self-pay

## 2022-03-20 ENCOUNTER — Ambulatory Visit: Payer: Medicare Other

## 2022-03-20 ENCOUNTER — Ambulatory Visit: Payer: Medicare Other | Admitting: Internal Medicine

## 2022-03-20 ENCOUNTER — Inpatient Hospital Stay: Payer: Medicare Other | Attending: Internal Medicine

## 2022-03-20 ENCOUNTER — Ambulatory Visit
Admission: RE | Admit: 2022-03-20 | Discharge: 2022-03-20 | Disposition: A | Discharge status: Home / Self Care | Payer: Medicare Other | Source: Ambulatory Visit | Attending: Radiation Oncology | Admitting: Radiation Oncology

## 2022-03-20 VITALS — BP 143/81 | HR 72 | Temp 96.3°F | Resp 18

## 2022-03-20 DIAGNOSIS — Z5111 Encounter for antineoplastic chemotherapy: Secondary | ICD-10-CM | POA: Diagnosis not present

## 2022-03-20 DIAGNOSIS — Z79899 Other long term (current) drug therapy: Secondary | ICD-10-CM | POA: Insufficient documentation

## 2022-03-20 DIAGNOSIS — C3402 Malignant neoplasm of left main bronchus: Secondary | ICD-10-CM | POA: Insufficient documentation

## 2022-03-20 DIAGNOSIS — L309 Dermatitis, unspecified: Secondary | ICD-10-CM | POA: Insufficient documentation

## 2022-03-20 DIAGNOSIS — C3411 Malignant neoplasm of upper lobe, right bronchus or lung: Secondary | ICD-10-CM

## 2022-03-20 DIAGNOSIS — Z87891 Personal history of nicotine dependence: Secondary | ICD-10-CM | POA: Insufficient documentation

## 2022-03-20 LAB — COMPREHENSIVE METABOLIC PANEL
ALT: 17 U/L (ref 0–44)
AST: 17 U/L (ref 15–41)
Albumin: 3.6 g/dL (ref 3.5–5.0)
Alkaline Phosphatase: 73 U/L (ref 38–126)
Anion gap: 7 (ref 5–15)
BUN: 11 mg/dL (ref 8–23)
CO2: 28 mmol/L (ref 22–32)
Calcium: 8.5 mg/dL — ABNORMAL LOW (ref 8.9–10.3)
Chloride: 101 mmol/L (ref 98–111)
Creatinine, Ser: 0.75 mg/dL (ref 0.61–1.24)
GFR, Estimated: >60 mL/min (ref >60)
Glucose, Bld: 93 mg/dL (ref 70–99)
Potassium: 3.9 mmol/L (ref 3.5–5.1)
Sodium: 136 mmol/L (ref 135–145)
Total Bilirubin: 0.7 mg/dL (ref 0.3–1.2)
Total Protein: 6.7 g/dL (ref 6.5–8.1)

## 2022-03-20 LAB — RAD ONC ARIA SESSION SUMMARY
Course Elapsed Days: 28
Plan Fractions Treated to Date: 20
Plan Prescribed Dose Per Fraction: 2 Gy
Plan Total Fractions Prescribed: 35
Plan Total Prescribed Dose: 70 Gy
Reference Point Dosage Given to Date: 40 Gy
Reference Point Session Dosage Given: 2 Gy
Session Number: 20

## 2022-03-20 LAB — CBC WITH DIFFERENTIAL/PLATELET
Abs Immature Granulocytes: 0.01 10*3/uL (ref 0.00–0.07)
Basophils Absolute: 0 10*3/uL (ref 0.0–0.1)
Basophils Relative: 0 %
Eosinophils Absolute: 0 10*3/uL (ref 0.0–0.5)
Eosinophils Relative: 1 %
HCT: 41.3 % (ref 39.0–52.0)
Hemoglobin: 13.1 g/dL (ref 13.0–17.0)
Immature Granulocytes: 0 %
Lymphocytes Relative: 18 %
Lymphs Abs: 0.6 10*3/uL — ABNORMAL LOW (ref 0.7–4.0)
MCH: 29.6 pg (ref 26.0–34.0)
MCHC: 31.7 g/dL (ref 30.0–36.0)
MCV: 93.4 fL (ref 80.0–100.0)
Monocytes Absolute: 0.3 10*3/uL (ref 0.1–1.0)
Monocytes Relative: 9 %
Neutro Abs: 2.3 10*3/uL (ref 1.7–7.7)
Neutrophils Relative %: 72 %
Platelets: 92 10*3/uL — ABNORMAL LOW (ref 150–400)
RBC: 4.42 MIL/uL (ref 4.22–5.81)
RDW: 13.4 % (ref 11.5–15.5)
WBC: 3.2 10*3/uL — ABNORMAL LOW (ref 4.0–10.5)
nRBC: 0 % (ref 0.0–0.2)

## 2022-03-20 MED ORDER — HEPARIN SOD (PORK) LOCK FLUSH 100 UNIT/ML IV SOLN
500.0000 [IU] | Freq: Once | INTRAVENOUS | Status: AC | PRN
  Administered 2022-03-20: 500 [IU]
  Filled 2022-03-20: qty 5

## 2022-03-20 MED ORDER — SODIUM CHLORIDE 0.9 % IV SOLN
10.0000 mg | Freq: Once | INTRAVENOUS | Status: AC
  Administered 2022-03-20: 10 mg via INTRAVENOUS
  Filled 2022-03-20: qty 1

## 2022-03-20 MED ORDER — SODIUM CHLORIDE 0.9 % IV SOLN
231.6000 mg | Freq: Once | INTRAVENOUS | Status: AC
  Administered 2022-03-20: 230 mg via INTRAVENOUS
  Filled 2022-03-20: qty 23

## 2022-03-20 MED ORDER — SODIUM CHLORIDE 0.9 % IV SOLN
Freq: Once | INTRAVENOUS | Status: AC
  Filled 2022-03-20: qty 250

## 2022-03-20 MED ORDER — PALONOSETRON HCL INJECTION 0.25 MG/5ML
0.2500 mg | Freq: Once | INTRAVENOUS | Status: AC
  Administered 2022-03-20: 0.25 mg via INTRAVENOUS
  Filled 2022-03-20: qty 5

## 2022-03-20 MED ORDER — PACLITAXEL PROTEIN-BOUND CHEMO INJECTION 100 MG
100.0000 mg/m2 | Freq: Once | INTRAVENOUS | Status: AC
  Administered 2022-03-20: 225 mg via INTRAVENOUS
  Filled 2022-03-20: qty 45

## 2022-03-20 MED ORDER — FAMOTIDINE IN NACL 20-0.9 MG/50ML-% IV SOLN
20.0000 mg | Freq: Once | INTRAVENOUS | Status: AC
  Administered 2022-03-20: 20 mg via INTRAVENOUS
  Filled 2022-03-20: qty 50

## 2022-03-20 NOTE — Patient Instructions (Signed)
[facility]CER CTR AT [place]  Discharge Instructions: Thank you for choosing [facility] to provide your oncology and hematology care.  If you have a lab appointment with the [facility], please go directly to the [facility] and check in at the registration area.  Wear comfortable clothing and clothing appropriate for easy access to any Portacath or PICC line.   We strive to give you quality time with your provider. You may need to reschedule your appointment if you arrive late (15 or more minutes).  Arriving late affects you and other patients whose appointments are after yours.  Also, if you miss three or more appointments without notifying the office, you may be dismissed from the clinic at the provider's discretion.      For prescription refill requests, have your pharmacy contact our office and allow 72 hours for refills to be completed.    Today you received the following chemotherapy and/or immunotherapy agents ABRAXENE and CARBOPLATIN      To help prevent nausea and vomiting after your treatment, we encourage you to take your nausea medication as directed.  BELOW ARE SYMPTOMS THAT SHOULD BE REPORTED IMMEDIATELY: *FEVER GREATER THAN 100.4 F (38 C) OR HIGHER *CHILLS OR SWEATING *NAUSEA AND VOMITING THAT IS NOT CONTROLLED WITH YOUR NAUSEA MEDICATION *UNUSUAL SHORTNESS OF BREATH *UNUSUAL BRUISING OR BLEEDING *URINARY PROBLEMS (pain or burning when urinating, or frequent urination) *BOWEL PROBLEMS (unusual diarrhea, constipation, pain near the anus) TENDERNESS IN MOUTH AND THROAT WITH OR WITHOUT PRESENCE OF ULCERS (sore throat, sores in mouth, or a toothache) UNUSUAL RASH, SWELLING OR PAIN  UNUSUAL VAGINAL DISCHARGE OR ITCHING   Items with * indicate a potential emergency and should be followed up as soon as possible or go to the Emergency Department if any problems should occur.  Please show the CHEMOTHERAPY ALERT CARD or IMMUNOTHERAPY ALERT CARD  at check-in to the Emergency Department and triage nurse.  Should you have questions after your visit or need to cancel or reschedule your appointment, please contact [facility] CANCER [place] AT [place]  [phone] and follow the prompts.  Office hours are 8:00 a.m. to 4:30 p.m. Monday - Friday. Please note that voicemails left after 4:00 p.m. may not be returned until the following business day.  We are closed weekends and major holidays. You have access to a nurse at all times for urgent questions. Please call the main number to the clinic [phone] and follow the prompts.  For any non-urgent questions, you may also contact your provider using MyChart. We now offer e-Visits for anyone 75 and older to request care online for non-urgent symptoms. For details visit [email or web address].   Also download the MyChart app! Go to the app store, search "MyChart", open the app, select [facility], and log in with your MyChart username and password.  Masks are optional in the cancer centers. If you would like for your care team to wear a mask while they are taking care of you, please let them know. For doctor visits, patients may have with them one support person who is at least 71 years old. At this time, visitors are not allowed in the infusion area.  Nanoparticle Albumin-Bound Paclitaxel injection What is this medication? NANOPARTICLE ALBUMIN-BOUND PACLITAXEL (Na no PAHR ti kuhl  al BYOO muhn-bound  PAK li TAX el) is a chemotherapy drug. It targets fast dividing cells, like cancer cells, and causes these cells to die. This medicine is used to treat advanced breast cancer, lung cancer,  and pancreatic cancer. This medicine may be used for other purposes; ask your health care provider or pharmacist if you have questions. COMMON BRAND NAME(S): Abraxane What should I tell my care team before I take this medication? They need to know if you have any of these conditions: kidney disease liver  disease low blood counts, like low white cell, platelet, or red cell counts lung or breathing disease, like asthma tingling of the fingers or toes, or other nerve disorder an unusual or allergic reaction to paclitaxel, albumin, other chemotherapy, other medicines, foods, dyes, or preservatives pregnant or trying to get pregnant breast-feeding How should I use this medication? This drug is given as an infusion into a vein. It is administered in a hospital or clinic by a specially trained health care professional. Talk to your pediatrician regarding the use of this medicine in children. Special care may be needed. Overdosage: If you think you have taken too much of this medicine contact a poison control center or emergency room at once. NOTE: This medicine is only for you. Do not share this medicine with others. What if I miss a dose? It is important not to miss your dose. Call your doctor or health care professional if you are unable to keep an appointment. What may interact with this medication? This medicine may interact with the following medications: antiviral medicines for hepatitis, HIV or AIDS certain antibiotics like erythromycin and clarithromycin certain medicines for fungal infections like ketoconazole and itraconazole certain medicines for seizures like carbamazepine, phenobarbital, phenytoin gemfibrozil nefazodone rifampin St. John's wort This list may not describe all possible interactions. Give your health care provider a list of all the medicines, herbs, non-prescription drugs, or dietary supplements you use. Also tell them if you smoke, drink alcohol, or use illegal drugs. Some items may interact with your medicine. What should I watch for while using this medication? Your condition will be monitored carefully while you are receiving this medicine. You will need important blood work done while you are taking this medicine. This medicine can cause serious allergic reactions.  If you experience allergic reactions like skin rash, itching or hives, swelling of the face, lips, or tongue, tell your doctor or health care professional right away. In some cases, you may be given additional medicines to help with side effects. Follow all directions for their use. This drug may make you feel generally unwell. This is not uncommon, as chemotherapy can affect healthy cells as well as cancer cells. Report any side effects. Continue your course of treatment even though you feel ill unless your doctor tells you to stop. Call your doctor or health care professional for advice if you get a fever, chills or sore throat, or other symptoms of a cold or flu. Do not treat yourself. This drug decreases your body's ability to fight infections. Try to avoid being around people who are sick. This medicine may increase your risk to bruise or bleed. Call your doctor or health care professional if you notice any unusual bleeding. Be careful brushing and flossing your teeth or using a toothpick because you may get an infection or bleed more easily. If you have any dental work done, tell your dentist you are receiving this medicine. Avoid taking products that contain aspirin, acetaminophen, ibuprofen, naproxen, or ketoprofen unless instructed by your doctor. These medicines may hide a fever. Do not become pregnant while taking this medicine or for 6 months after stopping it. Women should inform their doctor if they wish to  become pregnant or think they might be pregnant. Men should not father a child while taking this medicine or for 3 months after stopping it. There is a potential for serious side effects to an unborn child. Talk to your health care professional or pharmacist for more information. Do not breast-feed an infant while taking this medicine or for 2 weeks after stopping it. This medicine may interfere with the ability to get pregnant or to father a child. You should talk to your doctor or health  care professional if you are concerned about your fertility. What side effects may I notice from receiving this medication? Side effects that you should report to your doctor or health care professional as soon as possible: allergic reactions like skin rash, itching or hives, swelling of the face, lips, or tongue breathing problems changes in vision fast, irregular heartbeat low blood pressure mouth sores pain, tingling, numbness in the hands or feet signs of decreased platelets or bleeding - bruising, pinpoint red spots on the skin, black, tarry stools, blood in the urine signs of decreased red blood cells - unusually weak or tired, feeling faint or lightheaded, falls signs of infection - fever or chills, cough, sore throat, pain or difficulty passing urine signs and symptoms of liver injury like dark yellow or brown urine; general ill feeling or flu-like symptoms; light-colored stools; loss of appetite; nausea; right upper belly pain; unusually weak or tired; yellowing of the eyes or skin swelling of the ankles, feet, hands unusually slow heartbeat Side effects that usually do not require medical attention (report to your doctor or health care professional if they continue or are bothersome): diarrhea hair loss loss of appetite nausea, vomiting tiredness This list may not describe all possible side effects. Call your doctor for medical advice about side effects. You may report side effects to FDA at 1-800-FDA-1088. Where should I keep my medication? This drug is given in a hospital or clinic and will not be stored at home. NOTE: This sheet is a summary. It may not cover all possible information. If you have questions about this medicine, talk to your doctor, pharmacist, or health care provider.  2023 Elsevier/Gold Standard (2017-05-06 00:00:00)  Carboplatin injection What is this medication? CARBOPLATIN (KAR boe pla tin) is a chemotherapy drug. It targets fast dividing cells, like  cancer cells, and causes these cells to die. This medicine is used to treat ovarian cancer and many other cancers. This medicine may be used for other purposes; ask your health care provider or pharmacist if you have questions. COMMON BRAND NAME(S): Paraplatin What should I tell my care team before I take this medication? They need to know if you have any of these conditions: blood disorders hearing problems kidney disease recent or ongoing radiation therapy an unusual or allergic reaction to carboplatin, cisplatin, other chemotherapy, other medicines, foods, dyes, or preservatives pregnant or trying to get pregnant breast-feeding How should I use this medication? This drug is usually given as an infusion into a vein. It is administered in a hospital or clinic by a specially trained health care professional. Talk to your pediatrician regarding the use of this medicine in children. Special care may be needed. Overdosage: If you think you have taken too much of this medicine contact a poison control center or emergency room at once. NOTE: This medicine is only for you. Do not share this medicine with others. What if I miss a dose? It is important not to miss a dose. Call your doctor  or health care professional if you are unable to keep an appointment. What may interact with this medication? medicines for seizures medicines to increase blood counts like filgrastim, pegfilgrastim, sargramostim some antibiotics like amikacin, gentamicin, neomycin, streptomycin, tobramycin vaccines Talk to your doctor or health care professional before taking any of these medicines: acetaminophen aspirin ibuprofen ketoprofen naproxen This list may not describe all possible interactions. Give your health care provider a list of all the medicines, herbs, non-prescription drugs, or dietary supplements you use. Also tell them if you smoke, drink alcohol, or use illegal drugs. Some items may interact with your  medicine. What should I watch for while using this medication? Your condition will be monitored carefully while you are receiving this medicine. You will need important blood work done while you are taking this medicine. This drug may make you feel generally unwell. This is not uncommon, as chemotherapy can affect healthy cells as well as cancer cells. Report any side effects. Continue your course of treatment even though you feel ill unless your doctor tells you to stop. In some cases, you may be given additional medicines to help with side effects. Follow all directions for their use. Call your doctor or health care professional for advice if you get a fever, chills or sore throat, or other symptoms of a cold or flu. Do not treat yourself. This drug decreases your body's ability to fight infections. Try to avoid being around people who are sick. This medicine may increase your risk to bruise or bleed. Call your doctor or health care professional if you notice any unusual bleeding. Be careful brushing and flossing your teeth or using a toothpick because you may get an infection or bleed more easily. If you have any dental work done, tell your dentist you are receiving this medicine. Avoid taking products that contain aspirin, acetaminophen, ibuprofen, naproxen, or ketoprofen unless instructed by your doctor. These medicines may hide a fever. Do not become pregnant while taking this medicine. Women should inform their doctor if they wish to become pregnant or think they might be pregnant. There is a potential for serious side effects to an unborn child. Talk to your health care professional or pharmacist for more information. Do not breast-feed an infant while taking this medicine. What side effects may I notice from receiving this medication? Side effects that you should report to your doctor or health care professional as soon as possible: allergic reactions like skin rash, itching or hives, swelling  of the face, lips, or tongue signs of infection - fever or chills, cough, sore throat, pain or difficulty passing urine signs of decreased platelets or bleeding - bruising, pinpoint red spots on the skin, black, tarry stools, nosebleeds signs of decreased red blood cells - unusually weak or tired, fainting spells, lightheadedness breathing problems changes in hearing changes in vision chest pain high blood pressure low blood counts - This drug may decrease the number of white blood cells, red blood cells and platelets. You may be at increased risk for infections and bleeding. nausea and vomiting pain, swelling, redness or irritation at the injection site pain, tingling, numbness in the hands or feet problems with balance, talking, walking trouble passing urine or change in the amount of urine Side effects that usually do not require medical attention (report to your doctor or health care professional if they continue or are bothersome): hair loss loss of appetite metallic taste in the mouth or changes in taste This list may not describe all possible  side effects. Call your doctor for medical advice about side effects. You may report side effects to FDA at 1-800-FDA-1088. Where should I keep my medication? This drug is given in a hospital or clinic and will not be stored at home. NOTE: This sheet is a summary. It may not cover all possible information. If you have questions about this medicine, talk to your doctor, pharmacist, or health care provider.  2023 Elsevier/Gold Standard (2008-02-10 00:00:00)

## 2022-03-21 ENCOUNTER — Other Ambulatory Visit: Payer: Self-pay

## 2022-03-21 ENCOUNTER — Ambulatory Visit
Admission: RE | Admit: 2022-03-21 | Discharge: 2022-03-21 | Disposition: A | Discharge status: Home / Self Care | Payer: Medicare Other | Source: Ambulatory Visit | Attending: Radiation Oncology | Admitting: Radiation Oncology

## 2022-03-21 DIAGNOSIS — Z5111 Encounter for antineoplastic chemotherapy: Secondary | ICD-10-CM | POA: Diagnosis not present

## 2022-03-21 LAB — RAD ONC ARIA SESSION SUMMARY
Course Elapsed Days: 29
Plan Fractions Treated to Date: 21
Plan Prescribed Dose Per Fraction: 2 Gy
Plan Total Fractions Prescribed: 35
Plan Total Prescribed Dose: 70 Gy
Reference Point Dosage Given to Date: 42 Gy
Reference Point Session Dosage Given: 2 Gy
Session Number: 21

## 2022-03-22 ENCOUNTER — Other Ambulatory Visit: Payer: Self-pay

## 2022-03-22 ENCOUNTER — Ambulatory Visit
Admission: RE | Admit: 2022-03-22 | Discharge: 2022-03-22 | Disposition: A | Discharge status: Home / Self Care | Payer: Medicare Other | Source: Ambulatory Visit | Attending: Radiation Oncology | Admitting: Radiation Oncology

## 2022-03-22 DIAGNOSIS — Z5111 Encounter for antineoplastic chemotherapy: Secondary | ICD-10-CM | POA: Diagnosis not present

## 2022-03-22 LAB — RAD ONC ARIA SESSION SUMMARY
Course Elapsed Days: 30
Plan Fractions Treated to Date: 22
Plan Prescribed Dose Per Fraction: 2 Gy
Plan Total Fractions Prescribed: 35
Plan Total Prescribed Dose: 70 Gy
Reference Point Dosage Given to Date: 44 Gy
Reference Point Session Dosage Given: 2 Gy
Session Number: 22

## 2022-03-25 ENCOUNTER — Ambulatory Visit
Admission: RE | Admit: 2022-03-25 | Discharge: 2022-03-25 | Disposition: A | Discharge status: Home / Self Care | Payer: Medicare Other | Source: Ambulatory Visit | Attending: Radiation Oncology | Admitting: Radiation Oncology

## 2022-03-25 ENCOUNTER — Inpatient Hospital Stay (HOSPITAL_BASED_OUTPATIENT_CLINIC_OR_DEPARTMENT_OTHER): Payer: Medicare Other | Admitting: Nurse Practitioner

## 2022-03-25 ENCOUNTER — Other Ambulatory Visit: Payer: Self-pay

## 2022-03-25 DIAGNOSIS — L309 Dermatitis, unspecified: Secondary | ICD-10-CM

## 2022-03-25 DIAGNOSIS — Z5111 Encounter for antineoplastic chemotherapy: Secondary | ICD-10-CM | POA: Diagnosis not present

## 2022-03-25 LAB — RAD ONC ARIA SESSION SUMMARY
Course Elapsed Days: 33
Plan Fractions Treated to Date: 23
Plan Prescribed Dose Per Fraction: 2 Gy
Plan Total Fractions Prescribed: 35
Plan Total Prescribed Dose: 70 Gy
Reference Point Dosage Given to Date: 46 Gy
Reference Point Session Dosage Given: 2 Gy
Session Number: 23

## 2022-03-25 MED ORDER — HYDROCORTISONE 2.5 % EX OINT: TOPICAL_OINTMENT | Freq: Two times a day (BID) | CUTANEOUS | 0 refills | Status: AC

## 2022-03-25 MED ORDER — PREDNISONE 10 MG (21) PO TBPK: ORAL_TABLET | ORAL | 0 refills | Status: DC

## 2022-03-25 NOTE — Progress Notes (Signed)
[facility] [place] [facility] [place]. [facility] [address] [place], [place] [address] [phone] (phone) [phone] (fax)  Patient Care Team: [place] as PCP - General Telford Nab, RN as Oncology Nurse Navigator Cammie Sickle, MD as Consulting Physician (Oncology)   Name of the patient: Louis Sullivan  [id number]  [birth year]   Date of visit: 03/25/22  Diagnosis- Lung Cancer  Chief complaint/ Reason for visit- Rash  Heme/Onc history:  Oncology History Overview Note  IMPRESSION: 1. Thick-walled cavitary lesion of the posterior right upper lobe abutting the major fissure measuring 3.3 x 3.3 cm, highly concerning for primary lung malignancy or metastasis. 2. Although evaluation is limited by lack of intravenous contrast, suspect significant left hilar lymphadenopathy or mass, with apparent obstruction of the left upper lobe bronchus and severe narrowing of the left lower lobe bronchus. This finding is likewise highly concerning for primary lung malignancy or alternately nodal metastasis. Contrast enhanced CT may be helpful to more clearly evaluate the hilum. 3. There may be additional right hilar lymphadenopathy, again assessment limited by noncontrast CT. 4. Scattered heterogeneous and ground-glass airspace opacity throughout the upper lobes and right lower lobe, possibly reflecting postobstructive airspace disease in the left lung generally nonspecific. 5. Emphysema. 6. Coronary artery disease.   Aortic Atherosclerosis (ICD10-I70.0) and Emphysema (ICD10-J43.9).     Electronically Signed   By: Delanna Ahmadi M.D.   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 [Dr.Fleming/Dr.Aleskerov]  # LEFT HILAR- June 6th, 2023-  chemo-RT- plan SBRT of RUL nodule  # JUNE 14th-carbo-Taxol; 6/20 Taxol discontinued [acute  infusion reaction]; 6/27-carbo Abraxane weekly  # CAD; COPD [Dr.Fleming- O2 none now]; DM; HTN   Malignant neoplasm of upper lobe of right lung ([place])  02/01/2022 Initial Diagnosis   Malignant neoplasm of upper lobe of right lung ([place])   02/01/2022 Cancer Staging   Staging form: Lung, AJCC 8th Edition - Clinical: Stage IIIB (cT2a, cN3, cM0) - Signed by Cammie Sickle, MD on 02/01/2022   02/19/2022 -  Chemotherapy   Patient is on Treatment Plan : LUNG Carboplatin / Paclitaxel + XRT q7d       Interval history- Patient is 71 year old male diagnosed with lung cancer currently receiving chemo and radiation who presents [facility] for complaints of rash. He developed symptoms a few days ago and feels it is spreading. Localizes to right side of his body. Started on his shoulder and has spread to his side, face, and neck. Pruritic. Burning sensation. Denies contact irritants. No new medications.   Review of systems- Review of Systems  Constitutional:  Positive for malaise/fatigue. Negative for chills, fever and weight loss.  HENT:  Negative for hearing loss, nosebleeds, sore throat and tinnitus.   Eyes:  Negative for blurred vision and double vision.  Respiratory:  Negative for cough, hemoptysis, shortness of breath and wheezing.   Cardiovascular:  Negative for chest pain, palpitations and leg swelling.  Gastrointestinal:  Negative for abdominal pain, blood in stool, constipation, diarrhea, melena, nausea and vomiting.  Genitourinary:  Negative for dysuria and urgency.  Musculoskeletal:  Negative for back pain, falls, joint pain and myalgias.  Skin:  Negative for itching and rash.  Neurological:  Negative for dizziness, tingling, sensory change, loss of consciousness, weakness and headaches.  Endo/Heme/Allergies:  Negative for environmental allergies. Does not bruise/bleed easily.  Psychiatric/Behavioral:  Negative for depression. The patient is not nervous/anxious and does  not have insomnia.     Allergies  Allergen Reactions   Paclitaxel Shortness Of Breath, Swelling, Other (See Comments), Cough and Hypertension    Chest pain and tightness   Past Medical History:  Diagnosis Date   COPD (chronic obstructive pulmonary disease) (HCC)    Coronary artery disease    1 stent placed in about 2008   Diabetes mellitus without complication (HCC)    Dyspnea    Headache    migraines   Hyperlipidemia    Hypertension    Pneumonia 12/2021   hospitalized at [place]   Past Surgical History:  Procedure Laterality Date   CARDIAC CATHETERIZATION     COLONOSCOPY     IR IMAGING GUIDED PORT INSERTION  02/12/2022   VIDEO BRONCHOSCOPY WITH ENDOBRONCHIAL NAVIGATION N/A 01/23/2022   Procedure: VIDEO BRONCHOSCOPY WITH ENDOBRONCHIAL NAVIGATION;  Surgeon: Ottie Glazier, MD;  Location: [facility] ORS;  Service: Thoracic;  Laterality: N/A;   VIDEO BRONCHOSCOPY WITH ENDOBRONCHIAL ULTRASOUND N/A 01/23/2022   Procedure: VIDEO BRONCHOSCOPY WITH ENDOBRONCHIAL ULTRASOUND;  Surgeon: Ottie Glazier, MD;  Location: [facility] ORS;  Service: Thoracic;  Laterality: N/A;   Social History   Socioeconomic History   Marital status: Widowed    Spouse name: Not on file   Number of children: Not on file   Years of education: Not on file   Highest education level: Not on file  Occupational History   Not on file  Tobacco Use   Smoking status: Former    Packs/day: 1.00    Years: 20.00    Total pack years: 20.00    Types: Cigarettes    Quit date: 02/11/2022    Years since quitting: 0.1   Smokeless tobacco: Never   Tobacco comments:    Request to MD for Nicotine patch to help him quit smoking.  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 Lives in [place] self. Son passed in 2021;MI  wife passed away in 1990s/aneurysm; daughter in [place].    Social Determinants of Health   Financial Resource Strain: Not on file  Food Insecurity: Not on file  Transportation Needs: Not on file  Physical Activity: Not on file  Stress: Not on file  Social Connections: Not on file  Intimate Partner Violence: Not on file   Family History  Problem Relation Age of Onset   Throat cancer Brother    Current Outpatient Medications:    albuterol (PROVENTIL) (5 MG/ML) 0.5% nebulizer solution, Take 2.5 mg by nebulization every morning., Disp: , Rfl:    albuterol (VENTOLIN HFA) 108 (90 Base) MCG/ACT inhaler, Inhale 2 puffs into the lungs every 4 (four) hours as needed for wheezing or shortness of breath., Disp: , Rfl:    aspirin EC 81 MG tablet, Take 81 mg by mouth daily., Disp: , Rfl:    atorvastatin (LIPITOR) 40 MG tablet, Take 40 mg by mouth every morning., Disp: , Rfl:    blood glucose meter kit and supplies KIT, Dispense based on patient and insurance preference. Use up to four times daily as directed., Disp: 1 each, Rfl: 0   Cholecalciferol (VITAMIN D3 PO), Take 1 tablet by mouth daily at 6 (six) AM., Disp: ,  Rfl:    lidocaine-prilocaine (EMLA) cream, Apply on the port. 30 -45 min  prior to port access., Disp: 30 g, Rfl: 3   losartan (COZAAR) 100 MG tablet, Take 100 mg by mouth every evening., Disp: , Rfl:    losartan (COZAAR) 50 MG tablet, Take 50 mg by mouth in the morning., Disp: , Rfl:    metFORMIN (GLUCOPHAGE) 1000 MG tablet, Take 1,000 mg by mouth daily with breakfast., Disp: , Rfl:    Nicotine 21-14-7 MG/24HR KIT, Place 1 patch onto the skin daily. (Patient not taking: Reported on 03/12/2022), Disp: 60 kit, Rfl: 1   ondansetron (ZOFRAN) 8 MG tablet, One pill every 8 hours as needed for nausea/vomitting. (Patient not taking: Reported on 03/05/2022), Disp: 40 tablet, Rfl: 1   OXYGEN, Inhale 2 L into the lungs at bedtime., Disp: , Rfl:    prochlorperazine (COMPAZINE) 10 MG tablet, TAKE 1  TABLET(10 MG) BY MOUTH EVERY 6 HOURS AS NEEDED FOR NAUSEA OR VOMITING, Disp: 40 tablet, Rfl: 1   traZODone (DESYREL) 50 MG tablet, Take 50 mg by mouth at bedtime. (Patient not taking: Reported on 02/19/2022), Disp: , Rfl:   Physical exam: There were no vitals filed for this visit. Physical Exam Constitutional:      Appearance: He is not ill-appearing.  Skin:    Findings: Rash present.     Comments: See images  Neurological:     Mental Status: He is alert and oriented to person, place, and time.  Psychiatric:        Mood and Affect: Mood normal.        Behavior: Behavior normal.            Assessment and plan- Patient is a 71 y.o. male currently undergoing chemo-radiation for lung cancer who presents to [facility] for rash.   Rash- suspect allergic or irritative contact dermatitis. Does not correlate with radiation field. No new medications or topicals. Unlikely viral. Recommend oral steroids, prednisone taper with topical hydrocortisone 2.5% twice a day. Can take oral antihistamines such as benadryl or claritin (non-drowsy) as well.   Return to clinic if symptoms don't improve or worsen.    Visit Diagnosis 1. Dermatitis    Patient expressed understanding and was in agreement with this plan. He also understands that He can call clinic at any time with any questions, concerns, or complaints.   Thank you for allowing me to participate in the care of this very pleasant patient.   Beckey Rutter, DNP, AGNP-C [place] [facility] [place]

## 2022-03-26 ENCOUNTER — Other Ambulatory Visit: Payer: Self-pay

## 2022-03-26 ENCOUNTER — Ambulatory Visit
Admission: RE | Admit: 2022-03-26 | Discharge: 2022-03-26 | Disposition: A | Discharge status: Home / Self Care | Payer: Medicare Other | Source: Ambulatory Visit | Attending: Radiation Oncology | Admitting: Radiation Oncology

## 2022-03-26 DIAGNOSIS — Z5111 Encounter for antineoplastic chemotherapy: Secondary | ICD-10-CM | POA: Diagnosis not present

## 2022-03-26 LAB — RAD ONC ARIA SESSION SUMMARY
Course Elapsed Days: 34
Plan Fractions Treated to Date: 24
Plan Prescribed Dose Per Fraction: 2 Gy
Plan Total Fractions Prescribed: 35
Plan Total Prescribed Dose: 70 Gy
Reference Point Dosage Given to Date: 48 Gy
Reference Point Session Dosage Given: 2 Gy
Session Number: 24

## 2022-03-27 ENCOUNTER — Ambulatory Visit
Admission: RE | Admit: 2022-03-27 | Discharge: 2022-03-27 | Disposition: A | Discharge status: Home / Self Care | Payer: Medicare Other | Source: Ambulatory Visit | Attending: Radiation Oncology | Admitting: Radiation Oncology

## 2022-03-27 ENCOUNTER — Other Ambulatory Visit: Payer: Self-pay

## 2022-03-27 DIAGNOSIS — Z5111 Encounter for antineoplastic chemotherapy: Secondary | ICD-10-CM | POA: Diagnosis not present

## 2022-03-27 LAB — RAD ONC ARIA SESSION SUMMARY
Course Elapsed Days: 35
Plan Fractions Treated to Date: 25
Plan Prescribed Dose Per Fraction: 2 Gy
Plan Total Fractions Prescribed: 35
Plan Total Prescribed Dose: 70 Gy
Reference Point Dosage Given to Date: 50 Gy
Reference Point Session Dosage Given: 2 Gy
Session Number: 25

## 2022-03-28 ENCOUNTER — Ambulatory Visit
Admission: RE | Admit: 2022-03-28 | Discharge: 2022-03-28 | Disposition: A | Discharge status: Home / Self Care | Payer: Medicare Other | Source: Ambulatory Visit | Attending: Radiation Oncology | Admitting: Radiation Oncology

## 2022-03-28 ENCOUNTER — Other Ambulatory Visit: Payer: Self-pay

## 2022-03-28 DIAGNOSIS — Z5111 Encounter for antineoplastic chemotherapy: Secondary | ICD-10-CM | POA: Diagnosis not present

## 2022-03-28 LAB — RAD ONC ARIA SESSION SUMMARY
Course Elapsed Days: 36
Plan Fractions Treated to Date: 26
Plan Prescribed Dose Per Fraction: 2 Gy
Plan Total Fractions Prescribed: 35
Plan Total Prescribed Dose: 70 Gy
Reference Point Dosage Given to Date: 52 Gy
Reference Point Session Dosage Given: 2 Gy
Session Number: 26

## 2022-03-29 ENCOUNTER — Other Ambulatory Visit: Payer: Self-pay

## 2022-03-29 ENCOUNTER — Ambulatory Visit
Admission: RE | Admit: 2022-03-29 | Discharge: 2022-03-29 | Disposition: A | Discharge status: Home / Self Care | Payer: Medicare Other | Source: Ambulatory Visit | Attending: Radiation Oncology | Admitting: Radiation Oncology

## 2022-03-29 DIAGNOSIS — Z5111 Encounter for antineoplastic chemotherapy: Secondary | ICD-10-CM | POA: Diagnosis not present

## 2022-03-29 LAB — RAD ONC ARIA SESSION SUMMARY
Course Elapsed Days: 37
Plan Fractions Treated to Date: 27
Plan Prescribed Dose Per Fraction: 2 Gy
Plan Total Fractions Prescribed: 35
Plan Total Prescribed Dose: 70 Gy
Reference Point Dosage Given to Date: 54 Gy
Reference Point Session Dosage Given: 2 Gy
Session Number: 27

## 2022-04-01 ENCOUNTER — Other Ambulatory Visit: Payer: Self-pay

## 2022-04-01 ENCOUNTER — Ambulatory Visit
Admission: RE | Admit: 2022-04-01 | Discharge: 2022-04-01 | Disposition: A | Discharge status: Home / Self Care | Payer: Medicare Other | Source: Ambulatory Visit | Attending: Radiation Oncology | Admitting: Radiation Oncology

## 2022-04-01 DIAGNOSIS — Z5111 Encounter for antineoplastic chemotherapy: Secondary | ICD-10-CM | POA: Diagnosis not present

## 2022-04-01 LAB — RAD ONC ARIA SESSION SUMMARY
Course Elapsed Days: 40
Plan Fractions Treated to Date: 28
Plan Prescribed Dose Per Fraction: 2 Gy
Plan Total Fractions Prescribed: 35
Plan Total Prescribed Dose: 70 Gy
Reference Point Dosage Given to Date: 56 Gy
Reference Point Session Dosage Given: 2 Gy
Session Number: 28

## 2022-04-02 ENCOUNTER — Inpatient Hospital Stay: Payer: Medicare Other

## 2022-04-02 ENCOUNTER — Inpatient Hospital Stay (HOSPITAL_BASED_OUTPATIENT_CLINIC_OR_DEPARTMENT_OTHER): Payer: Medicare Other | Admitting: Internal Medicine

## 2022-04-02 ENCOUNTER — Other Ambulatory Visit: Payer: Self-pay

## 2022-04-02 ENCOUNTER — Encounter: Payer: Self-pay | Admitting: Internal Medicine

## 2022-04-02 ENCOUNTER — Ambulatory Visit
Admission: RE | Admit: 2022-04-02 | Discharge: 2022-04-02 | Disposition: A | Discharge status: Home / Self Care | Payer: Medicare Other | Source: Ambulatory Visit | Attending: Radiation Oncology | Admitting: Radiation Oncology

## 2022-04-02 VITALS — BP 146/78 | HR 70

## 2022-04-02 DIAGNOSIS — C3411 Malignant neoplasm of upper lobe, right bronchus or lung: Secondary | ICD-10-CM | POA: Diagnosis not present

## 2022-04-02 DIAGNOSIS — Z5111 Encounter for antineoplastic chemotherapy: Secondary | ICD-10-CM | POA: Diagnosis not present

## 2022-04-02 LAB — CBC WITH DIFFERENTIAL/PLATELET
Abs Immature Granulocytes: 0.01 10*3/uL (ref 0.00–0.07)
Basophils Absolute: 0 10*3/uL (ref 0.0–0.1)
Basophils Relative: 1 %
Eosinophils Absolute: 0 10*3/uL (ref 0.0–0.5)
Eosinophils Relative: 2 %
HCT: 41.7 % (ref 39.0–52.0)
Hemoglobin: 13.3 g/dL (ref 13.0–17.0)
Immature Granulocytes: 1 %
Lymphocytes Relative: 24 %
Lymphs Abs: 0.5 10*3/uL — ABNORMAL LOW (ref 0.7–4.0)
MCH: 29.9 pg (ref 26.0–34.0)
MCHC: 31.9 g/dL (ref 30.0–36.0)
MCV: 93.7 fL (ref 80.0–100.0)
Monocytes Absolute: 0.4 10*3/uL (ref 0.1–1.0)
Monocytes Relative: 19 %
Neutro Abs: 1.1 10*3/uL — ABNORMAL LOW (ref 1.7–7.7)
Neutrophils Relative %: 53 %
Platelets: 127 10*3/uL — ABNORMAL LOW (ref 150–400)
RBC: 4.45 MIL/uL (ref 4.22–5.81)
RDW: 14.6 % (ref 11.5–15.5)
WBC: 2.1 10*3/uL — ABNORMAL LOW (ref 4.0–10.5)
nRBC: 0 % (ref 0.0–0.2)

## 2022-04-02 LAB — COMPREHENSIVE METABOLIC PANEL
ALT: 21 U/L (ref 0–44)
AST: 18 U/L (ref 15–41)
Albumin: 3.6 g/dL (ref 3.5–5.0)
Alkaline Phosphatase: 66 U/L (ref 38–126)
Anion gap: 7 (ref 5–15)
BUN: 15 mg/dL (ref 8–23)
CO2: 25 mmol/L (ref 22–32)
Calcium: 8 mg/dL — ABNORMAL LOW (ref 8.9–10.3)
Chloride: 107 mmol/L (ref 98–111)
Creatinine, Ser: 0.98 mg/dL (ref 0.61–1.24)
GFR, Estimated: >60 mL/min (ref >60)
Glucose, Bld: 100 mg/dL — ABNORMAL HIGH (ref 70–99)
Potassium: 3.7 mmol/L (ref 3.5–5.1)
Sodium: 139 mmol/L (ref 135–145)
Total Bilirubin: 0.7 mg/dL (ref 0.3–1.2)
Total Protein: 6.3 g/dL — ABNORMAL LOW (ref 6.5–8.1)

## 2022-04-02 LAB — RAD ONC ARIA SESSION SUMMARY
Course Elapsed Days: 41
Plan Fractions Treated to Date: 29
Plan Prescribed Dose Per Fraction: 2 Gy
Plan Total Fractions Prescribed: 35
Plan Total Prescribed Dose: 70 Gy
Reference Point Dosage Given to Date: 58 Gy
Reference Point Session Dosage Given: 2 Gy
Session Number: 29

## 2022-04-02 MED ORDER — PALONOSETRON HCL INJECTION 0.25 MG/5ML
0.2500 mg | Freq: Once | INTRAVENOUS | Status: AC
  Administered 2022-04-02: 0.25 mg via INTRAVENOUS
  Filled 2022-04-02: qty 5

## 2022-04-02 MED ORDER — HEPARIN SOD (PORK) LOCK FLUSH 100 UNIT/ML IV SOLN
500.0000 [IU] | Freq: Once | INTRAVENOUS | Status: AC | PRN
  Administered 2022-04-02: 500 [IU]
  Filled 2022-04-02: qty 5

## 2022-04-02 MED ORDER — SODIUM CHLORIDE 0.9 % IV SOLN
10.0000 mg | Freq: Once | INTRAVENOUS | Status: AC
  Administered 2022-04-02: 10 mg via INTRAVENOUS
  Filled 2022-04-02: qty 1

## 2022-04-02 MED ORDER — SODIUM CHLORIDE 0.9 % IV SOLN
Freq: Once | INTRAVENOUS | Status: AC
  Filled 2022-04-02: qty 250

## 2022-04-02 MED ORDER — FAMOTIDINE IN NACL 20-0.9 MG/50ML-% IV SOLN
20.0000 mg | Freq: Once | INTRAVENOUS | Status: AC
  Administered 2022-04-02: 20 mg via INTRAVENOUS

## 2022-04-02 MED ORDER — PACLITAXEL PROTEIN-BOUND CHEMO INJECTION 100 MG
100.0000 mg/m2 | Freq: Once | INTRAVENOUS | Status: AC
  Administered 2022-04-02: 225 mg via INTRAVENOUS
  Filled 2022-04-02: qty 45

## 2022-04-02 MED ORDER — SODIUM CHLORIDE 0.9 % IV SOLN
231.6000 mg | Freq: Once | INTRAVENOUS | Status: AC
  Administered 2022-04-02: 230 mg via INTRAVENOUS
  Filled 2022-04-02: qty 23

## 2022-04-02 MED FILL — Dexamethasone Sodium Phosphate Inj 100 MG/10ML: INTRAMUSCULAR | Qty: 1 | Status: AC

## 2022-04-02 NOTE — Progress Notes (Signed)
[place] NOTE  Patient Care Team: [place] as PCP - General Telford Nab, RN as Oncology Nurse Navigator Cammie Sickle, MD as Consulting Physician (Oncology)  CHIEF COMPLAINTS/PURPOSE OF CONSULTATION: lung cancer  #  Oncology History Overview Note  IMPRESSION: 1. Thick-walled cavitary lesion of the posterior right upper lobe abutting the major fissure measuring 3.3 x 3.3 cm, highly concerning for primary lung malignancy or metastasis. 2. Although evaluation is limited by lack of intravenous contrast, suspect significant left hilar lymphadenopathy or mass, with apparent obstruction of the left upper lobe bronchus and severe narrowing of the left lower lobe bronchus. This finding is likewise highly concerning for primary lung malignancy or alternately nodal metastasis. Contrast enhanced CT may be helpful to more clearly evaluate the hilum. 3. There may be additional right hilar lymphadenopathy, again assessment limited by noncontrast CT. 4. Scattered heterogeneous and ground-glass airspace opacity throughout the upper lobes and right lower lobe, possibly reflecting postobstructive airspace disease in the left lung generally nonspecific. 5. Emphysema. 6. Coronary artery disease.   Aortic Atherosclerosis (ICD10-I70.0) and Emphysema (ICD10-J43.9).     Electronically Signed   By: Delanna Ahmadi M.D.   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 [Dr.Fleming/Dr.Aleskerov]  # LEFT HILAR- June 6th, 2023-  chemo-RT- plan SBRT of RUL nodule  # JUNE 14th-carbo-Taxol; 6/20 Taxol discontinued [acute infusion reaction]; 6/27-carbo Abraxane weekly  # CAD; COPD [Dr.Fleming- O2 none now]; DM; HTN   Malignant neoplasm of upper lobe of right lung ([place])  02/01/2022 Initial Diagnosis   Malignant neoplasm of upper lobe of right lung ([place])   02/01/2022 Cancer Staging   Staging form: Lung, AJCC 8th Edition -  Clinical: Stage IIIB (cT2a, cN3, cM0) - Signed by Cammie Sickle, MD on 02/01/2022   02/19/2022 -  Chemotherapy   Patient is on Treatment Plan : LUNG Carboplatin / Paclitaxel + XRT q7d        HISTORY OF PRESENTING ILLNESS: Alone.  Ambulating independently.  Louis Sullivan 71 y.o.  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  Patient denies any worsening shortness of breath or cough. Patient has chronic mild shortness of breath.  Chronic mild cough not any worse.  Louis Sullivan has been using his inhalers ProAir.  Denies any headaches or vision changes.  Review of Systems  Constitutional:  Negative for chills, diaphoresis, fever, malaise/fatigue and weight loss.  HENT:  Negative for nosebleeds and sore throat.   Eyes:  Negative for double vision.  Respiratory:  Positive for cough and shortness of breath. Negative for hemoptysis, sputum production and wheezing.   Cardiovascular:  Negative for chest pain, palpitations, orthopnea and leg swelling.  Gastrointestinal:  Negative for abdominal pain, blood in stool, constipation, diarrhea, heartburn, melena, nausea and vomiting.  Genitourinary:  Negative for dysuria, frequency and urgency.  Musculoskeletal:  Positive for joint pain. Negative for back pain.  Skin: Negative.  Negative for itching and rash.  Neurological:  Negative for dizziness, tingling, focal weakness, weakness and headaches.  Endo/Heme/Allergies:  Does not bruise/bleed easily.  Psychiatric/Behavioral:  Negative for depression. The patient is not nervous/anxious and does not have insomnia.      MEDICAL HISTORY:  Past Medical History:  Diagnosis Date   COPD (chronic obstructive pulmonary disease) ([place])    Coronary artery disease    1 stent placed  in about 2008   Diabetes mellitus without complication ([place])    Dyspnea    Headache    migraines    Hyperlipidemia    Hypertension    Pneumonia 12/2021   hospitalized at [place]: Past Surgical History:  Procedure Laterality Date   CARDIAC CATHETERIZATION     COLONOSCOPY     IR IMAGING GUIDED PORT INSERTION  02/12/2022   VIDEO BRONCHOSCOPY WITH ENDOBRONCHIAL NAVIGATION N/A 01/23/2022   Procedure: VIDEO BRONCHOSCOPY WITH ENDOBRONCHIAL NAVIGATION;  Surgeon: Ottie Glazier, MD;  Location: [facility] ORS;  Service: Thoracic;  Laterality: N/A;   VIDEO BRONCHOSCOPY WITH ENDOBRONCHIAL ULTRASOUND N/A 01/23/2022   Procedure: VIDEO BRONCHOSCOPY WITH ENDOBRONCHIAL ULTRASOUND;  Surgeon: Ottie Glazier, MD;  Location: [facility] ORS;  Service: Thoracic;  Laterality: N/A;    SOCIAL HISTORY: Social History   Socioeconomic History   Marital status: Widowed    Spouse name: Not on file   Number of children: Not on file   Years of education: Not on file   Highest education level: Not on file  Occupational History   Not on file  Tobacco Use   Smoking status: Former    Packs/day: 1.00    Years: 20.00    Total pack years: 20.00    Types: Cigarettes    Quit date: 02/11/2022    Years since quitting: 0.1   Smokeless tobacco: Never   Tobacco comments:    Request to MD for Nicotine patch to help him quit smoking.  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 Son passed in 2021;MI wife passed away in 1990s/aneurysm; daughter in [place].    Social Determinants of Health   Financial Resource Strain: Not on file  Food Insecurity: Not on file  Transportation Needs: Not on file  Physical Activity: Not on file  Stress: Not on file  Social Connections: Not on file  Intimate Partner Violence: Not on file    FAMILY HISTORY: Family History  Problem  Relation Age of Onset   Throat cancer Brother     ALLERGIES:  is allergic to paclitaxel.  MEDICATIONS:  Current Outpatient Medications  Medication Sig Dispense Refill   albuterol (PROVENTIL) (5 MG/ML) 0.5% nebulizer solution Take 2.5 mg by nebulization every morning.     albuterol (VENTOLIN HFA) 108 (90 Base) MCG/ACT inhaler Inhale 2 puffs into the lungs every 4 (four) hours as needed for wheezing or shortness of breath.     aspirin EC 81 MG tablet Take 81 mg by mouth daily.     atorvastatin (LIPITOR) 40 MG tablet Take 40 mg by mouth every morning.     blood glucose meter kit and supplies KIT Dispense based on patient and insurance preference. Use up to four times daily as directed. 1 each 0   Cholecalciferol (VITAMIN D3 PO) Take 1 tablet by mouth daily at 6 (six) AM.     hydrocortisone 2.5 % ointment Apply topically 2 (two) times daily. Apply to skin rash twice a day as needed for itch. 30 g 0   lidocaine-prilocaine (EMLA) cream Apply on the port. 30 -45 min  prior to port access. 30 g 3   losartan (COZAAR) 100 MG tablet Take 100 mg by mouth  every evening.     losartan (COZAAR) 50 MG tablet Take 50 mg by mouth in the morning.     metFORMIN (GLUCOPHAGE) 1000 MG tablet Take 1,000 mg by mouth daily with breakfast.     OXYGEN Inhale 2 L into the lungs at bedtime.     predniSONE (STERAPRED UNI-PAK 21 TAB) 10 MG (21) TBPK tablet Day 1: 2 tab before breakfast, 1 after lunch, 1 after dinner, & 2 at bedtime Day 2: 1 tab before breakfast, 1 after lunch, 1 after dinner, & 2 at bedtime Day 3: 1 tab before breakfast, 1 after lunch, 1 after dinner, & 1 at bedtime Day 4: 1 tab before breakfast, 1 after lunch, & 1 at bedtime Day 5: 1 tab before breakfast & 1 at bedtime Day 6: 1 tablet before breakfast 21 tablet 0   prochlorperazine (COMPAZINE) 10 MG tablet TAKE 1 TABLET(10 MG) BY MOUTH EVERY 6 HOURS AS NEEDED FOR NAUSEA OR VOMITING 40 tablet 1   Nicotine 21-14-7 MG/24HR KIT Place 1 patch onto the skin daily.  (Patient not taking: Reported on 03/12/2022) 60 kit 1   ondansetron (ZOFRAN) 8 MG tablet One pill every 8 hours as needed for nausea/vomitting. (Patient not taking: Reported on 03/05/2022) 40 tablet 1   traZODone (DESYREL) 50 MG tablet Take 50 mg by mouth at bedtime. (Patient not taking: Reported on 02/19/2022)     No current facility-administered medications for this visit.   Facility-Administered Medications Ordered in Other Visits  Medication Dose Route Frequency Provider Last Rate Last Admin   0.9 %  sodium chloride infusion   Intravenous Once Cammie Sickle, MD       CARBOplatin (PARAPLATIN) 230 mg in sodium chloride 0.9 % 100 mL chemo infusion  230 mg Intravenous Once Cammie Sickle, MD       dexamethasone (DECADRON) 10 mg in sodium chloride 0.9 % 50 mL IVPB  10 mg Intravenous Once Cammie Sickle, MD       famotidine (PEPCID) IVPB 20 mg premix  20 mg Intravenous Once Cammie Sickle, MD       PACLitaxel-protein bound (ABRAXANE) chemo infusion 225 mg  100 mg/m2 (Treatment Plan Recorded) Intravenous Once Cammie Sickle, MD       palonosetron (ALOXI) injection 0.25 mg  0.25 mg Intravenous Once Charlaine Dalton R, MD          .  PHYSICAL EXAMINATION: ECOG PERFORMANCE STATUS: 1 - Symptomatic but completely ambulatory  Vitals:   04/02/22 1054  BP: (!) 141/81  Pulse: 72  Resp: 20  Temp: 98.7 F (37.1 C)  SpO2: 97%    Filed Weights   04/02/22 1054  Weight: 214 lb 6.4 oz (97.3 kg)     Physical Exam Vitals and nursing note reviewed.  HENT:     Head: Normocephalic and atraumatic.     Mouth/Throat:     Pharynx: Oropharynx is clear.  Eyes:     Extraocular Movements: Extraocular movements intact.     Pupils: Pupils are equal, round, and reactive to light.  Cardiovascular:     Rate and Rhythm: Normal rate and regular rhythm.  Pulmonary:     Comments: Decreased breath sounds bilaterally.  Abdominal:     Palpations: Abdomen is soft.   Musculoskeletal:        General: Normal range of motion.     Cervical back: Normal range of motion.  Skin:    General: Skin is warm.  Neurological:     General:  No focal deficit present.     Mental Status: Louis Sullivan is alert and oriented to person, place, and time.  Psychiatric:        Behavior: Behavior normal.        Judgment: Judgment normal.      LABORATORY DATA:  I have reviewed the data as listed Lab Results  Component Value Date   WBC 2.1 (L) 04/02/2022   HGB 13.3 04/02/2022   HCT 41.7 04/02/2022   MCV 93.7 04/02/2022   PLT 127 (L) 04/02/2022   Recent Labs    03/12/22 0911 03/20/22 0934 04/02/22 1038  NA 136 136 139  K 3.9 3.9 3.7  CL 102 101 107  CO2 27 28 25   GLUCOSE 107* 93 100*  BUN 10 11 15   CREATININE 0.97 0.75 0.98  CALCIUM 8.4* 8.5* 8.0*  GFRNONAA >60 >60 >60  PROT 7.0 6.7 6.3*  ALBUMIN 3.7 3.6 3.6  AST 18 17 18   ALT 19 17 21   ALKPHOS 78 73 66  BILITOT 0.4 0.7 0.7    RADIOGRAPHIC STUDIES: I have personally reviewed the radiological images as listed and agreed with the findings in the report. No results found.  ASSESSMENT & PLAN:   Malignant neoplasm of upper lobe of right lung (HCC) # Clinical stage III vs Stage IV [left hilar malignancy with contralateral right lung nodule] -right upper lobe lung cancer non-small cell favor-squamous cell; Left Hilar- Bx- squamous cell.  PET MAY 16XW- Hypermetabolic cavitary mass of the right upper lobe; Hypermetabolic left hilar mass versus conglomerate hilar lymphadenopathy, possibly due to nodal metastatic disease or primary lung malignancy. Pending-r NGS-F-ONE.  Currently on chemoradiation-CarboTaxol weekly; followed by adjuvant immunotherapy.  Post chemoradiation plan SBRT-right upper lobe mass.   #Proceed with Carbo- Abraxane with RT [July 26th - 07/26; Taxol:allergy] weekly #6 chemotherapy. Labs today reviewed; ANC 1.1; platelets- 127;  acceptable for treatment today.  Last weekly CarboTaxol plan on July 25.  Discussed with patient that we will plan to repeat imaging in about a month or so after finishing treatment  #Right upper lobe mass -awaiting SBRT [08/07- 8-21].     Gastritis: #Recommend Pepcid over-the-counter-1 a day in the morning; take 30 minutes an hour before breakfast.  And also use Tums over-the-counter.  # Constipation- sec to chemo- recommend Miralax once or twice as needed. STABLE.   # Smoking: Active smoker; quit smoking recently. OFF  nicotine patches. STABLE.   # COPD: on proair- continue inhalers as per pulmonary.Stable-    #Diabetes-monitor closely on chemotherapy. Recommend BG/alncets; strips 2 or 3 times a day- Stable-   # IV ACCESS: port-functional.    # DISPOSITION: # chemo today- carbo-Abarxane  weekly.  # in 1 week as planned- labs/- cbc/cmp; carbo-abraxane- # in 3-4 weeks--labs/- cbc/cmp;NO chemo- Dr.B          All questions were answered. The patient knows to call the clinic with any problems, questions or concerns.       Cammie Sickle, MD 04/02/2022 11:32 AM

## 2022-04-02 NOTE — Assessment & Plan Note (Addendum)
#   Clinical stage III vs Stage IV [left hilar malignancy with contralateral right lung nodule] -right upper lobe lung cancer non-small cell favor-squamous cell; Left Hilar- Bx- squamous cell.  PET MAY 16FB- Hypermetabolic cavitary mass of the right upper lobe; Hypermetabolic left hilar mass versus conglomerate hilar lymphadenopathy, possibly due to nodal metastatic disease or primary lung malignancy. Pending-r NGS-F-ONE.  Currently on chemoradiation-CarboTaxol weekly; followed by adjuvant immunotherapy.  Post chemoradiation plan SBRT-right upper lobe mass.   #Proceed with Carbo- Abraxane with RT [July 26th - 07/26; Taxol:allergy] weekly #6 chemotherapy. Labs today reviewed; ANC 1.1; platelets- 127;  acceptable for treatment today.  Last weekly CarboTaxol plan on July 25. Discussed with patient that we will plan to repeat imaging in about a month or so after finishing treatment  #Right upper lobe mass -awaiting SBRT [08/07- 8-21].     Gastritis: #Recommend Pepcid over-the-counter-1 a day in the morning; take 30 minutes an hour before breakfast.  And also use Tums over-the-counter.  # Constipation- sec to chemo- recommend Miralax once or twice as needed. STABLE.   # Smoking: Active smoker; quit smoking recently. OFF  nicotine patches. STABLE.   # COPD: on proair- continue inhalers as per pulmonary.Stable-    #Diabetes-monitor closely on chemotherapy. Recommend BG/alncets; strips 2 or 3 times a day- Stable-   # IV ACCESS: port-functional.    # DISPOSITION: # chemo today- carbo-Abarxane  weekly.  # in 1 week as planned- labs/- cbc/cmp; carbo-abraxane- # in 3-4 weeks--labs/- cbc/cmp;NO chemo- Dr.B

## 2022-04-02 NOTE — Patient Instructions (Signed)
[facility]  Discharge Instructions: Thank you for choosing  Cancer Center to provide your oncology and hematology care.  If you have a lab appointment with the Cancer Center, please go directly to the Cancer Center and check in at the registration area.  Wear comfortable clothing and clothing appropriate for easy access to any Portacath or PICC line.   We strive to give you quality time with your provider. You may need to reschedule your appointment if you arrive late (15 or more minutes).  Arriving late affects you and other patients whose appointments are after yours.  Also, if you miss three or more appointments without notifying the office, you may be dismissed from the clinic at the provider's discretion.      For prescription refill requests, have your pharmacy contact our office and allow 72 hours for refills to be completed.       To help prevent nausea and vomiting after your treatment, we encourage you to take your nausea medication as directed.  BELOW ARE SYMPTOMS THAT SHOULD BE REPORTED IMMEDIATELY: *FEVER GREATER THAN 100.4 F (38 C) OR HIGHER *CHILLS OR SWEATING *NAUSEA AND VOMITING THAT IS NOT CONTROLLED WITH YOUR NAUSEA MEDICATION *UNUSUAL SHORTNESS OF BREATH *UNUSUAL BRUISING OR BLEEDING *URINARY PROBLEMS (pain or burning when urinating, or frequent urination) *BOWEL PROBLEMS (unusual diarrhea, constipation, pain near the anus) TENDERNESS IN MOUTH AND THROAT WITH OR WITHOUT PRESENCE OF ULCERS (sore throat, sores in mouth, or a toothache) UNUSUAL RASH, SWELLING OR PAIN  UNUSUAL VAGINAL DISCHARGE OR ITCHING   Items with * indicate a potential emergency and should be followed up as soon as possible or go to the Emergency Department if any problems should occur.  Please show the CHEMOTHERAPY ALERT CARD or IMMUNOTHERAPY ALERT CARD at check-in to the Emergency Department and triage nurse.  Should you have questions after your  visit or need to cancel or reschedule your appointment, please contact [facility] [place] [facility]  [phone] and follow the prompts.  Office hours are 8:00 a.m. to 4:30 p.m. Monday - Friday. Please note that voicemails left after 4:00 p.m. may not be returned until the following business day.  We are closed weekends and major holidays. You have access to a nurse at all times for urgent questions. Please call the main number to the clinic [phone] and follow the prompts.  For any non-urgent questions, you may also contact your provider using MyChart. We now offer e-Visits for anyone 18 and older to request care online for non-urgent symptoms. For details visit [email or web address].   Also download the MyChart app! Go to the app store, search "MyChart", open the app, select , and log in with your MyChart username and password.  Masks are optional in the cancer centers. If you would like for your care team to wear a mask while they are taking care of you, please let them know. For doctor visits, patients may have with them one support person who is at least 71 years old. At this time, visitors are not allowed in the infusion area.   

## 2022-04-03 ENCOUNTER — Other Ambulatory Visit: Payer: Self-pay

## 2022-04-03 ENCOUNTER — Ambulatory Visit
Admission: RE | Admit: 2022-04-03 | Discharge: 2022-04-03 | Disposition: A | Discharge status: Home / Self Care | Payer: Medicare Other | Source: Ambulatory Visit | Attending: Radiation Oncology | Admitting: Radiation Oncology

## 2022-04-03 DIAGNOSIS — Z5111 Encounter for antineoplastic chemotherapy: Secondary | ICD-10-CM | POA: Diagnosis not present

## 2022-04-03 LAB — RAD ONC ARIA SESSION SUMMARY
Course Elapsed Days: 42
Plan Fractions Treated to Date: 30
Plan Prescribed Dose Per Fraction: 2 Gy
Plan Total Fractions Prescribed: 35
Plan Total Prescribed Dose: 70 Gy
Reference Point Dosage Given to Date: 60 Gy
Reference Point Session Dosage Given: 2 Gy
Session Number: 30

## 2022-04-04 ENCOUNTER — Ambulatory Visit
Admission: RE | Admit: 2022-04-04 | Discharge: 2022-04-04 | Disposition: A | Discharge status: Home / Self Care | Payer: Medicare Other | Source: Ambulatory Visit | Attending: Radiation Oncology | Admitting: Radiation Oncology

## 2022-04-04 ENCOUNTER — Other Ambulatory Visit: Payer: Self-pay

## 2022-04-04 DIAGNOSIS — Z5111 Encounter for antineoplastic chemotherapy: Secondary | ICD-10-CM | POA: Diagnosis not present

## 2022-04-04 LAB — RAD ONC ARIA SESSION SUMMARY
Course Elapsed Days: 43
Plan Fractions Treated to Date: 31
Plan Prescribed Dose Per Fraction: 2 Gy
Plan Total Fractions Prescribed: 35
Plan Total Prescribed Dose: 70 Gy
Reference Point Dosage Given to Date: 62 Gy
Reference Point Session Dosage Given: 2 Gy
Session Number: 31

## 2022-04-05 ENCOUNTER — Other Ambulatory Visit: Payer: Self-pay

## 2022-04-05 ENCOUNTER — Ambulatory Visit
Admission: RE | Admit: 2022-04-05 | Discharge: 2022-04-05 | Disposition: A | Discharge status: Home / Self Care | Payer: Medicare Other | Source: Ambulatory Visit | Attending: Radiation Oncology | Admitting: Radiation Oncology

## 2022-04-05 DIAGNOSIS — Z5111 Encounter for antineoplastic chemotherapy: Secondary | ICD-10-CM | POA: Diagnosis not present

## 2022-04-05 LAB — RAD ONC ARIA SESSION SUMMARY
Course Elapsed Days: 44
Plan Fractions Treated to Date: 32
Plan Prescribed Dose Per Fraction: 2 Gy
Plan Total Fractions Prescribed: 35
Plan Total Prescribed Dose: 70 Gy
Reference Point Dosage Given to Date: 64 Gy
Reference Point Session Dosage Given: 2 Gy
Session Number: 32

## 2022-04-08 ENCOUNTER — Other Ambulatory Visit: Payer: Self-pay

## 2022-04-08 ENCOUNTER — Ambulatory Visit
Admission: RE | Admit: 2022-04-08 | Discharge: 2022-04-08 | Disposition: A | Discharge status: Home / Self Care | Payer: Medicare Other | Source: Ambulatory Visit | Attending: Radiation Oncology | Admitting: Radiation Oncology

## 2022-04-08 DIAGNOSIS — Z5111 Encounter for antineoplastic chemotherapy: Secondary | ICD-10-CM | POA: Diagnosis not present

## 2022-04-08 LAB — RAD ONC ARIA SESSION SUMMARY
Course Elapsed Days: 47
Plan Fractions Treated to Date: 33
Plan Prescribed Dose Per Fraction: 2 Gy
Plan Total Fractions Prescribed: 35
Plan Total Prescribed Dose: 70 Gy
Reference Point Dosage Given to Date: 66 Gy
Reference Point Session Dosage Given: 2 Gy
Session Number: 33

## 2022-04-09 ENCOUNTER — Ambulatory Visit
Admission: RE | Admit: 2022-04-09 | Discharge: 2022-04-09 | Disposition: A | Discharge status: Home / Self Care | Payer: Medicare Other | Source: Ambulatory Visit | Attending: Radiation Oncology | Admitting: Radiation Oncology

## 2022-04-09 ENCOUNTER — Inpatient Hospital Stay: Payer: Medicare Other

## 2022-04-09 ENCOUNTER — Other Ambulatory Visit: Payer: Self-pay

## 2022-04-09 VITALS — BP 119/75 | HR 69 | Temp 96.8°F | Resp 18 | Ht 69.0 in | Wt 212.2 lb

## 2022-04-09 DIAGNOSIS — Z5111 Encounter for antineoplastic chemotherapy: Secondary | ICD-10-CM | POA: Diagnosis not present

## 2022-04-09 DIAGNOSIS — C3411 Malignant neoplasm of upper lobe, right bronchus or lung: Secondary | ICD-10-CM

## 2022-04-09 LAB — CBC WITH DIFFERENTIAL/PLATELET
Abs Immature Granulocytes: 0.01 10*3/uL (ref 0.00–0.07)
Basophils Absolute: 0 10*3/uL (ref 0.0–0.1)
Basophils Relative: 1 %
Eosinophils Absolute: 0 10*3/uL (ref 0.0–0.5)
Eosinophils Relative: 1 %
HCT: 38.7 % — ABNORMAL LOW (ref 39.0–52.0)
Hemoglobin: 12.3 g/dL — ABNORMAL LOW (ref 13.0–17.0)
Immature Granulocytes: 1 %
Lymphocytes Relative: 24 %
Lymphs Abs: 0.5 10*3/uL — ABNORMAL LOW (ref 0.7–4.0)
MCH: 29.8 pg (ref 26.0–34.0)
MCHC: 31.8 g/dL (ref 30.0–36.0)
MCV: 93.7 fL (ref 80.0–100.0)
Monocytes Absolute: 0.2 10*3/uL (ref 0.1–1.0)
Monocytes Relative: 11 %
Neutro Abs: 1.3 10*3/uL — ABNORMAL LOW (ref 1.7–7.7)
Neutrophils Relative %: 62 %
Platelets: 164 10*3/uL (ref 150–400)
RBC: 4.13 MIL/uL — ABNORMAL LOW (ref 4.22–5.81)
RDW: 14.7 % (ref 11.5–15.5)
WBC: 2 10*3/uL — ABNORMAL LOW (ref 4.0–10.5)
nRBC: 0 % (ref 0.0–0.2)

## 2022-04-09 LAB — RAD ONC ARIA SESSION SUMMARY
Course Elapsed Days: 48
Plan Fractions Treated to Date: 34
Plan Prescribed Dose Per Fraction: 2 Gy
Plan Total Fractions Prescribed: 35
Plan Total Prescribed Dose: 70 Gy
Reference Point Dosage Given to Date: 68 Gy
Reference Point Session Dosage Given: 2 Gy
Session Number: 34

## 2022-04-09 LAB — COMPREHENSIVE METABOLIC PANEL
ALT: 18 U/L (ref 0–44)
AST: 18 U/L (ref 15–41)
Albumin: 3.7 g/dL (ref 3.5–5.0)
Alkaline Phosphatase: 61 U/L (ref 38–126)
Anion gap: 1 — ABNORMAL LOW (ref 5–15)
BUN: 13 mg/dL (ref 8–23)
CO2: 26 mmol/L (ref 22–32)
Calcium: 8.4 mg/dL — ABNORMAL LOW (ref 8.9–10.3)
Chloride: 108 mmol/L (ref 98–111)
Creatinine, Ser: 0.93 mg/dL (ref 0.61–1.24)
GFR, Estimated: >60 mL/min (ref >60)
Glucose, Bld: 101 mg/dL — ABNORMAL HIGH (ref 70–99)
Potassium: 4.1 mmol/L (ref 3.5–5.1)
Sodium: 135 mmol/L (ref 135–145)
Total Bilirubin: 0.7 mg/dL (ref 0.3–1.2)
Total Protein: 6.9 g/dL (ref 6.5–8.1)

## 2022-04-09 MED ORDER — SODIUM CHLORIDE 0.9 % IV SOLN
10.0000 mg | Freq: Once | INTRAVENOUS | Status: AC
  Administered 2022-04-09: 10 mg via INTRAVENOUS
  Filled 2022-04-09 (×2): qty 1

## 2022-04-09 MED ORDER — SODIUM CHLORIDE 0.9 % IV SOLN
Freq: Once | INTRAVENOUS | Status: AC
  Filled 2022-04-09: qty 250

## 2022-04-09 MED ORDER — FAMOTIDINE IN NACL 20-0.9 MG/50ML-% IV SOLN
20.0000 mg | Freq: Once | INTRAVENOUS | Status: AC
  Administered 2022-04-09: 20 mg via INTRAVENOUS
  Filled 2022-04-09: qty 50

## 2022-04-09 MED ORDER — PACLITAXEL PROTEIN-BOUND CHEMO INJECTION 100 MG
100.0000 mg/m2 | Freq: Once | INTRAVENOUS | Status: AC
  Administered 2022-04-09: 225 mg via INTRAVENOUS
  Filled 2022-04-09: qty 45

## 2022-04-09 MED ORDER — SODIUM CHLORIDE 0.9 % IV SOLN
231.6000 mg | Freq: Once | INTRAVENOUS | Status: AC
  Administered 2022-04-09: 230 mg via INTRAVENOUS
  Filled 2022-04-09: qty 23

## 2022-04-09 MED ORDER — HEPARIN SOD (PORK) LOCK FLUSH 100 UNIT/ML IV SOLN
500.0000 [IU] | Freq: Once | INTRAVENOUS | Status: AC | PRN
  Administered 2022-04-09: 500 [IU]
  Filled 2022-04-09: qty 5

## 2022-04-09 MED ORDER — PALONOSETRON HCL INJECTION 0.25 MG/5ML
0.2500 mg | Freq: Once | INTRAVENOUS | Status: AC
  Administered 2022-04-09: 0.25 mg via INTRAVENOUS
  Filled 2022-04-09: qty 5

## 2022-04-09 NOTE — Patient Instructions (Signed)
[facility] [place]  Discharge Instructions: Thank you for choosing [facility] to provide your oncology and hematology care.  If you have a lab appointment with the [facility], please go directly to the [place] [facility] [place] and check in at the registration area.  Wear comfortable clothing and clothing appropriate for easy access to any Portacath or PICC line.   We strive to give you quality time with your provider. You may need to reschedule your appointment if you arrive late (15 or more minutes).  Arriving late affects you and other patients whose appointments are after yours.  Also, if you miss three or more appointments without notifying the office, you may be dismissed from the clinic at the provider's discretion.      For prescription refill requests, have your pharmacy contact our office and allow 72 hours for refills to be completed.    Today you received the following chemotherapy and/or immunotherapy agents CARBOPLATIN and ABRAXENE      To help prevent nausea and vomiting after your treatment, we encourage you to take your nausea medication as directed.  BELOW ARE SYMPTOMS THAT SHOULD BE REPORTED IMMEDIATELY: *FEVER GREATER THAN 100.4 F (38 C) OR HIGHER *CHILLS OR SWEATING *NAUSEA AND VOMITING THAT IS NOT CONTROLLED WITH YOUR NAUSEA MEDICATION *UNUSUAL SHORTNESS OF BREATH *UNUSUAL BRUISING OR BLEEDING *URINARY PROBLEMS (pain or burning when urinating, or frequent urination) *BOWEL PROBLEMS (unusual diarrhea, constipation, pain near the anus) TENDERNESS IN MOUTH AND THROAT WITH OR WITHOUT PRESENCE OF ULCERS (sore throat, sores in mouth, or a toothache) UNUSUAL RASH, SWELLING OR PAIN  UNUSUAL VAGINAL DISCHARGE OR ITCHING   Items with * indicate a potential emergency and should be followed up as soon as possible or go to the Emergency Department if any problems should occur.  Please show the CHEMOTHERAPY ALERT CARD or IMMUNOTHERAPY ALERT CARD  at check-in to the Emergency Department and triage nurse.  Should you have questions after your visit or need to cancel or reschedule your appointment, please contact [facility] [place] [facility] [place]  [phone] and follow the prompts.  Office hours are 8:00 a.m. to 4:30 p.m. Monday - Friday. Please note that voicemails left after 4:00 p.m. may not be returned until the following business day.  We are closed weekends and major holidays. You have access to a nurse at all times for urgent questions. Please call the main number to the clinic [phone] and follow the prompts.  For any non-urgent questions, you may also contact your provider using MyChart. We now offer e-Visits for anyone 45 and older to request care online for non-urgent symptoms. For details visit [email or web address].   Also download the MyChart app! Go to the app store, search "MyChart", open the app, select [facility], and log in with your MyChart username and password.  Masks are optional in the cancer centers. If you would like for your care team to wear a mask while they are taking care of you, please let them know. For doctor visits, patients may have with them one support person who is at least 71 years old. At this time, visitors are not allowed in the infusion area.  Carboplatin injection What is this medication? CARBOPLATIN (KAR boe pla tin) is a chemotherapy drug. It targets fast dividing cells, like cancer cells, and causes these cells to die. This medicine is used to treat ovarian cancer and many other cancers. This medicine may be used for other purposes; ask your health care provider  or pharmacist if you have questions. COMMON BRAND NAME(S): Paraplatin What should I tell my care team before I take this medication? They need to know if you have any of these conditions: blood disorders hearing problems kidney disease recent or ongoing radiation therapy an unusual or allergic reaction to  carboplatin, cisplatin, other chemotherapy, other medicines, foods, dyes, or preservatives pregnant or trying to get pregnant breast-feeding How should I use this medication? This drug is usually given as an infusion into a vein. It is administered in a hospital or clinic by a specially trained health care professional. Talk to your pediatrician regarding the use of this medicine in children. Special care may be needed. Overdosage: If you think you have taken too much of this medicine contact a poison control center or emergency room at once. NOTE: This medicine is only for you. Do not share this medicine with others. What if I miss a dose? It is important not to miss a dose. Call your doctor or health care professional if you are unable to keep an appointment. What may interact with this medication? medicines for seizures medicines to increase blood counts like filgrastim, pegfilgrastim, sargramostim some antibiotics like amikacin, gentamicin, neomycin, streptomycin, tobramycin vaccines Talk to your doctor or health care professional before taking any of these medicines: acetaminophen aspirin ibuprofen ketoprofen naproxen This list may not describe all possible interactions. Give your health care provider a list of all the medicines, herbs, non-prescription drugs, or dietary supplements you use. Also tell them if you smoke, drink alcohol, or use illegal drugs. Some items may interact with your medicine. What should I watch for while using this medication? Your condition will be monitored carefully while you are receiving this medicine. You will need important blood work done while you are taking this medicine. This drug may make you feel generally unwell. This is not uncommon, as chemotherapy can affect healthy cells as well as cancer cells. Report any side effects. Continue your course of treatment even though you feel ill unless your doctor tells you to stop. In some cases, you may be  given additional medicines to help with side effects. Follow all directions for their use. Call your doctor or health care professional for advice if you get a fever, chills or sore throat, or other symptoms of a cold or flu. Do not treat yourself. This drug decreases your body's ability to fight infections. Try to avoid being around people who are sick. This medicine may increase your risk to bruise or bleed. Call your doctor or health care professional if you notice any unusual bleeding. Be careful brushing and flossing your teeth or using a toothpick because you may get an infection or bleed more easily. If you have any dental work done, tell your dentist you are receiving this medicine. Avoid taking products that contain aspirin, acetaminophen, ibuprofen, naproxen, or ketoprofen unless instructed by your doctor. These medicines may hide a fever. Do not become pregnant while taking this medicine. Women should inform their doctor if they wish to become pregnant or think they might be pregnant. There is a potential for serious side effects to an unborn child. Talk to your health care professional or pharmacist for more information. Do not breast-feed an infant while taking this medicine. What side effects may I notice from receiving this medication? Side effects that you should report to your doctor or health care professional as soon as possible: allergic reactions like skin rash, itching or hives, swelling of the face, lips, or  tongue signs of infection - fever or chills, cough, sore throat, pain or difficulty passing urine signs of decreased platelets or bleeding - bruising, pinpoint red spots on the skin, black, tarry stools, nosebleeds signs of decreased red blood cells - unusually weak or tired, fainting spells, lightheadedness breathing problems changes in hearing changes in vision chest pain high blood pressure low blood counts - This drug may decrease the number of white blood cells, red  blood cells and platelets. You may be at increased risk for infections and bleeding. nausea and vomiting pain, swelling, redness or irritation at the injection site pain, tingling, numbness in the hands or feet problems with balance, talking, walking trouble passing urine or change in the amount of urine Side effects that usually do not require medical attention (report to your doctor or health care professional if they continue or are bothersome): hair loss loss of appetite metallic taste in the mouth or changes in taste This list may not describe all possible side effects. Call your doctor for medical advice about side effects. You may report side effects to FDA at 1-800-FDA-1088. Where should I keep my medication? This drug is given in a hospital or clinic and will not be stored at home. NOTE: This sheet is a summary. It may not cover all possible information. If you have questions about this medicine, talk to your doctor, pharmacist, or health care provider.  2023 Elsevier/Gold Standard (2008-02-10 00:00:00)  Nanoparticle Albumin-Bound Paclitaxel injection What is this medication? NANOPARTICLE ALBUMIN-BOUND PACLITAXEL (Na no PAHR ti kuhl  al BYOO muhn-bound  PAK li TAX el) is a chemotherapy drug. It targets fast dividing cells, like cancer cells, and causes these cells to die. This medicine is used to treat advanced breast cancer, lung cancer, and pancreatic cancer. This medicine may be used for other purposes; ask your health care provider or pharmacist if you have questions. COMMON BRAND NAME(S): Abraxane What should I tell my care team before I take this medication? They need to know if you have any of these conditions: kidney disease liver disease low blood counts, like low white cell, platelet, or red cell counts lung or breathing disease, like asthma tingling of the fingers or toes, or other nerve disorder an unusual or allergic reaction to paclitaxel, albumin, other  chemotherapy, other medicines, foods, dyes, or preservatives pregnant or trying to get pregnant breast-feeding How should I use this medication? This drug is given as an infusion into a vein. It is administered in a hospital or clinic by a specially trained health care professional. Talk to your pediatrician regarding the use of this medicine in children. Special care may be needed. Overdosage: If you think you have taken too much of this medicine contact a poison control center or emergency room at once. NOTE: This medicine is only for you. Do not share this medicine with others. What if I miss a dose? It is important not to miss your dose. Call your doctor or health care professional if you are unable to keep an appointment. What may interact with this medication? This medicine may interact with the following medications: antiviral medicines for hepatitis, HIV or AIDS certain antibiotics like erythromycin and clarithromycin certain medicines for fungal infections like ketoconazole and itraconazole certain medicines for seizures like carbamazepine, phenobarbital, phenytoin gemfibrozil nefazodone rifampin St. John's wort This list may not describe all possible interactions. Give your health care provider a list of all the medicines, herbs, non-prescription drugs, or dietary supplements you use. Also tell them  if you smoke, drink alcohol, or use illegal drugs. Some items may interact with your medicine. What should I watch for while using this medication? Your condition will be monitored carefully while you are receiving this medicine. You will need important blood work done while you are taking this medicine. This medicine can cause serious allergic reactions. If you experience allergic reactions like skin rash, itching or hives, swelling of the face, lips, or tongue, tell your doctor or health care professional right away. In some cases, you may be given additional medicines to help with  side effects. Follow all directions for their use. This drug may make you feel generally unwell. This is not uncommon, as chemotherapy can affect healthy cells as well as cancer cells. Report any side effects. Continue your course of treatment even though you feel ill unless your doctor tells you to stop. Call your doctor or health care professional for advice if you get a fever, chills or sore throat, or other symptoms of a cold or flu. Do not treat yourself. This drug decreases your body's ability to fight infections. Try to avoid being around people who are sick. This medicine may increase your risk to bruise or bleed. Call your doctor or health care professional if you notice any unusual bleeding. Be careful brushing and flossing your teeth or using a toothpick because you may get an infection or bleed more easily. If you have any dental work done, tell your dentist you are receiving this medicine. Avoid taking products that contain aspirin, acetaminophen, ibuprofen, naproxen, or ketoprofen unless instructed by your doctor. These medicines may hide a fever. Do not become pregnant while taking this medicine or for 6 months after stopping it. Women should inform their doctor if they wish to become pregnant or think they might be pregnant. Men should not father a child while taking this medicine or for 3 months after stopping it. There is a potential for serious side effects to an unborn child. Talk to your health care professional or pharmacist for more information. Do not breast-feed an infant while taking this medicine or for 2 weeks after stopping it. This medicine may interfere with the ability to get pregnant or to father a child. You should talk to your doctor or health care professional if you are concerned about your fertility. What side effects may I notice from receiving this medication? Side effects that you should report to your doctor or health care professional as soon as  possible: allergic reactions like skin rash, itching or hives, swelling of the face, lips, or tongue breathing problems changes in vision fast, irregular heartbeat low blood pressure mouth sores pain, tingling, numbness in the hands or feet signs of decreased platelets or bleeding - bruising, pinpoint red spots on the skin, black, tarry stools, blood in the urine signs of decreased red blood cells - unusually weak or tired, feeling faint or lightheaded, falls signs of infection - fever or chills, cough, sore throat, pain or difficulty passing urine signs and symptoms of liver injury like dark yellow or brown urine; general ill feeling or flu-like symptoms; light-colored stools; loss of appetite; nausea; right upper belly pain; unusually weak or tired; yellowing of the eyes or skin swelling of the ankles, feet, hands unusually slow heartbeat Side effects that usually do not require medical attention (report to your doctor or health care professional if they continue or are bothersome): diarrhea hair loss loss of appetite nausea, vomiting tiredness This list may not describe all possible  side effects. Call your doctor for medical advice about side effects. You may report side effects to FDA at 1-800-FDA-1088. Where should I keep my medication? This drug is given in a hospital or clinic and will not be stored at home. NOTE: This sheet is a summary. It may not cover all possible information. If you have questions about this medicine, talk to your doctor, pharmacist, or health care provider.  2023 Elsevier/Gold Standard (2017-05-06 00:00:00)

## 2022-04-10 ENCOUNTER — Ambulatory Visit: Payer: Medicare Other

## 2022-04-10 ENCOUNTER — Other Ambulatory Visit: Payer: Self-pay

## 2022-04-10 ENCOUNTER — Ambulatory Visit: Admission: RE | Admit: 2022-04-10 | Payer: Medicare Other | Source: Ambulatory Visit

## 2022-04-10 ENCOUNTER — Ambulatory Visit
Admission: RE | Admit: 2022-04-10 | Discharge: 2022-04-10 | Disposition: A | Discharge status: Home / Self Care | Payer: Medicare Other | Source: Ambulatory Visit | Attending: Radiation Oncology | Admitting: Radiation Oncology

## 2022-04-10 DIAGNOSIS — Z5111 Encounter for antineoplastic chemotherapy: Secondary | ICD-10-CM | POA: Diagnosis not present

## 2022-04-10 LAB — RAD ONC ARIA SESSION SUMMARY
Course Elapsed Days: 49
Plan Fractions Treated to Date: 35
Plan Prescribed Dose Per Fraction: 2 Gy
Plan Total Fractions Prescribed: 35
Plan Total Prescribed Dose: 70 Gy
Reference Point Dosage Given to Date: 70 Gy
Reference Point Session Dosage Given: 2 Gy
Session Number: 35

## 2022-04-11 ENCOUNTER — Ambulatory Visit: Payer: Medicare Other

## 2022-04-11 DIAGNOSIS — Z5111 Encounter for antineoplastic chemotherapy: Secondary | ICD-10-CM | POA: Diagnosis not present

## 2022-04-15 ENCOUNTER — Encounter: Payer: Self-pay | Admitting: Cardiology

## 2022-04-15 ENCOUNTER — Ambulatory Visit (INDEPENDENT_AMBULATORY_CARE_PROVIDER_SITE_OTHER): Payer: Medicare Other

## 2022-04-15 ENCOUNTER — Ambulatory Visit (INDEPENDENT_AMBULATORY_CARE_PROVIDER_SITE_OTHER): Payer: Medicare Other | Admitting: Cardiology

## 2022-04-15 VITALS — BP 134/70 | HR 71 | Ht 69.0 in | Wt 216.6 lb

## 2022-04-15 DIAGNOSIS — I10 Essential (primary) hypertension: Secondary | ICD-10-CM

## 2022-04-15 DIAGNOSIS — R002 Palpitations: Secondary | ICD-10-CM

## 2022-04-15 DIAGNOSIS — I2510 Atherosclerotic heart disease of native coronary artery without angina pectoris: Secondary | ICD-10-CM

## 2022-04-15 DIAGNOSIS — I499 Cardiac arrhythmia, unspecified: Secondary | ICD-10-CM

## 2022-04-15 NOTE — Progress Notes (Signed)
Cardiology Office Note:    Date:  04/15/2022   ID:  Louis Sullivan, DOB [birth year], MRN [id number]  PCP:  [facility] Cardiologist:  None     Referring MD: [place]   Chief Complaint  Patient presents with   Irregular Heart Beat    Patient states that he feels fine today. Meds reviewed with patient.     History of Present Illness:    Louis Sullivan is a 71 y.o. male with a hx of CAD (chest CT 5/23, 3V coronary Ca2+ ) ,hypertension, hyperlipidemia, former smoker x40 years, COPD, lung cancer s/p chemo plus radiation who presents due to irregular heartbeats.  Patient diagnosed with lung cancer 2 months ago, underwent chemotherapy, radiation being planned.  During his recent oncology visit, heartbeat irregularly was noted.  He denies chest pain or palpitations.  Was told 25yr years ago he has coronary artery disease, not amenable to stenting.  On aspirin, Lipitor.     Past Medical History:  Diagnosis Date   [place]    Coronary artery disease    1 stent placed in about 2008   Diabetes mellitus without complication (HCC)    Dyspnea    Headache    migraines   Hyperlipidemia    Hypertension    Pneumonia 12/2021   [facility]   Past Surgical History:  Procedure Laterality Date   CARDIAC CATHETERIZATION     COLONOSCOPY     IR IMAGING GUIDED PORT INSERTION  02/12/2022   VIDEO BRONCHOSCOPY WITH ENDOBRONCHIAL NAVIGATION N/A 01/23/2022   Procedure: [place]  Surgeon: AOttie Glazier MD;  Location: [facility] ORS;  Service: Thoracic;  Laterality: N/A;   VIDEO BRONCHOSCOPY WITH ENDOBRONCHIAL ULTRASOUND N/A 01/23/2022   Procedure: VIDEO BRONCHOSCOPY WITH ENDOBRONCHIAL ULTRASOUND;  Surgeon: AOttie Glazier MD;  Location: [facility] ORS;  Service: Thoracic;  Laterality: N/A;    Current Medications: Current Meds  Medication Sig   albuterol  (PROVENTIL) (5 MG/ML) 0.5% nebulizer solution Take 2.5 mg by nebulization every morning.   albuterol (VENTOLIN HFA) 108 (90 Base) MCG/ACT inhaler Inhale 2 puffs into the lungs every 4 (four) hours as needed for wheezing or shortness of breath.   aspirin EC 81 MG tablet Take 81 mg by mouth daily.   atorvastatin (LIPITOR) 40 MG tablet Take 40 mg by mouth every morning.   blood glucose meter kit and supplies KIT Dispense based on patient and insurance preference. Use up to four times daily as directed.   Cholecalciferol (VITAMIN D3 PO) Take 1 tablet by mouth daily at 6 (six) AM.   hydrocortisone 2.5 % ointment Apply topically 2 (two) times daily. Apply to skin rash twice a day as needed for itch.   lidocaine-prilocaine (EMLA) cream Apply on the port. 30 -45 min  prior to port access.   losartan (COZAAR) 100 MG tablet Take 100 mg by mouth every evening.   losartan (COZAAR) 50 MG tablet Take 50 mg by mouth in the morning.   metFORMIN (GLUCOPHAGE) 1000 MG tablet Take 1,000 mg by mouth daily with breakfast.   OXYGEN Inhale 2 L into the lungs at bedtime.   prochlorperazine (COMPAZINE) 10 MG tablet TAKE 1 TABLET(10 MG) BY MOUTH EVERY 6 HOURS AS NEEDED FOR NAUSEA OR VOMITING     Allergies:   Paclitaxel   Social History   Socioeconomic History   Marital status: Widowed    Spouse name: Not  on file   Number of children: Not on file   Years of education: Not on file   Highest education level: Not on file  Occupational History   Not on file  Tobacco Use   Smoking status: Former    Packs/day: 1.00    Years: 20.00    Total pack years: 20.00    Types: Cigarettes    Quit date: 02/11/2022    Years since quitting: 0.1   Smokeless tobacco: Never   Tobacco comments:    Request to MD for Nicotine patch to help him quit smoking.  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 Lives in [place]. Son passed in 2021;MI wife passed away in 1990s/aneurysm; daughter in [place].    Social Determinants of Health   Financial Resource Strain: Not on file  Food Insecurity: Not on file  Transportation Needs: Not on file  Physical Activity: Not on file  Stress: Not on file  Social Connections: Not on file     Family History: The patient's family history includes Throat cancer in his brother.  ROS:   Please see the history of present illness.     All other systems reviewed and are negative.  EKGs/Labs/Other Studies Reviewed:    The following studies were reviewed today:   EKG:  EKG is  ordered today.  The ekg ordered today demonstrates sinus rhythm, normal ECG.  Recent Labs: 02/01/2022: TSH 1.091 04/09/2022: ALT 18; BUN 13; Creatinine, Ser 0.93; Hemoglobin 12.3; Platelets 164; Potassium 4.1; Sodium 135  Recent Lipid Panel No results found for: "CHOL", "TRIG", "HDL", "CHOLHDL", "VLDL", "LDLCALC", "LDLDIRECT"   Risk Assessment/Calculations:        Physical Exam:    VS:  BP 134/70 (BP Location: Right Arm, Patient Position: Sitting, Cuff Size: Normal)   Pulse 71   Ht 5' 9"  (1.753 m)   Wt 216 lb 9.6 oz (98.2 kg)   SpO2 95%   BMI 31.99 kg/m     Wt Readings from Last 3 Encounters:  04/15/22 216 lb 9.6 oz (98.2 kg)  04/09/22 212 lb 3.1 oz (96.2 kg)  04/02/22 214 lb 6.4 oz (97.3 kg)     GEN:  Well nourished, well developed in no acute distress HEENT: Normal NECK: No JVD; No carotid bruits CARDIAC: RRR, no murmurs, rubs, gallops RESPIRATORY: Bilateral wheezing. ABDOMEN: Soft, non-tender, non-distended MUSCULOSKELETAL:  No edema; No deformity  SKIN: Warm and dry NEUROLOGIC:  Alert and oriented x 3 PSYCHIATRIC:  Normal affect   ASSESSMENT:    1. Irregular heart beat   2. Coronary artery disease involving native coronary artery of native heart without angina  pectoris   3. Primary hypertension   4. Palpitations    PLAN:    In order of problems listed above:  Irregular heartbeat, place cardiac monitor x2 weeks. CAD, three-vessel calcifications on chest CT.  Denies chest pain.  Continue aspirin, Lipitor.  Get echocardiogram. Hypertension, BP controlled.  Continue Lipitor.     Follow-up after echo and cardiac monitor  Medication Adjustments/Labs and Tests Ordered: Current medicines are reviewed at length with the patient today.  Concerns regarding medicines are outlined above.  Orders Placed This Encounter  Procedures   LONG TERM MONITOR (3-14 DAYS)  EKG 12-Lead   ECHOCARDIOGRAM COMPLETE   No orders of the defined types were placed in this encounter.   Patient Instructions  Medication Instructions:   Your physician recommends that you continue on your current medications as directed. Please refer to the Current Medication list given to you today.   *If you need a refill on your cardiac medications before your next appointment, please call your pharmacy*   Lab Work: None ordered  If you have labs (blood work) drawn today and your tests are completely normal, you will receive your results only by: [place] (if you have MyChart) OR A paper copy in the mail If you have any lab test that is abnormal or we need to change your treatment, we will call you to review the results.   Testing/Procedures:  1) Echocardiogram  Your physician has requested that you have an echocardiogram. Echocardiography is a painless test that uses sound waves to create images of your heart. It provides your doctor with information about the size and shape of your heart and how well your heart's chambers and valves are working. This procedure takes approximately one hour. There are no restrictions for this procedure. There is a possibility that an IV may need to be started during your test to inject an image enhancing agent. This is done to obtain  more optimal pictures of your heart. Therefore we ask that you do at least drink some water prior to coming in to hydrate your veins.    2) Heart Monitor:  Length of Wear: 2 weeks  Your monitor will be mailed to your home address within 3-5 business days. However, if you have not received your monitor after 5 business days please send [place] a MyChart message or call the office at [phone], so we may follow up on this for you.   Your physician has recommended that you wear a Zio XT monitor.   This monitor is a medical device that records the heart's electrical activity. Doctors most often use these monitors to diagnose arrhythmias. Arrhythmias are problems with the speed or rhythm of the heartbeat. The monitor is a small device applied to your chest. You can wear one while you do your normal daily activities. While wearing this monitor if you have any symptoms to push the button and record what you felt. Once you have worn this monitor for the period of time provider prescribed (Usually 14 days), you will return the monitor device in the postage paid box. Once it is returned they will download the data collected and provide [place] with a report which the provider will then review and we will call you with those results. Important tips:  Avoid showering during the first 24 hours of wearing the monitor. Avoid excessive sweating to help maximize wear time. Do not submerge the device, no hot tubs, and no swimming pools. Keep any lotions or oils away from the patch. After 24 hours you may shower with the patch on. Take brief showers with your back facing the shower head.  Do not remove patch once it has been placed because that will interrupt data and decrease adhesive wear time. Push the button when you have any symptoms and write down what you were feeling. Once you have completed wearing your monitor, remove and place into box which has postage paid and place in your outgoing mailbox.  If for some  reason you have misplaced your box then call our office and we can provide another box and/or  mail it off for you.       Follow-Up: At [facility], you and your health needs are our priority.  As part of our continuing mission to provide you with exceptional heart care, we have created designated Provider Care Teams.  These Care Teams include your primary Cardiologist (physician) and Advanced Practice Providers (APPs -  Physician Assistants and Nurse Practitioners) who all work together to provide you with the care you need, when you need it.  We recommend signing up for the patient portal called "MyChart".  Sign up information is provided on this After Visit Summary.  MyChart is used to connect with patients for Virtual Visits (Telemedicine).  Patients are able to view lab/test results, encounter notes, upcoming appointments, etc.  Non-urgent messages can be sent to your provider as well.   To learn more about what you can do with MyChart, go to [email or web address].    Your next appointment:   5 week(s)  The format for your next appointment:   In Person  Provider:   You may see Kate Sable, MD or one of the following Advanced Practice Providers on your designated Care Team:   Murray Hodgkins, NP Christell Faith, PA-C Cadence Kathlen Mody, PA-C    Other Instructions Echocardiogram An echocardiogram is a test that uses sound waves (ultrasound) to produce images of the heart. Images from an echocardiogram can provide important information about: Heart size and shape. The size and thickness and movement of your heart's walls. Heart muscle function and strength. Heart valve function or if you have stenosis. Stenosis is when the heart valves are too narrow. If blood is flowing backward through the heart valves (regurgitation). A tumor or infectious growth around the heart valves. Areas of heart muscle that are not working well because of poor blood flow or injury from a heart  attack. Aneurysm detection. An aneurysm is a weak or damaged part of an artery wall. The wall bulges out from the normal force of blood pumping through the body. Tell a health care provider about: Any allergies you have. All medicines you are taking, including vitamins, herbs, eye drops, creams, and over-the-counter medicines. Any blood disorders you have. Any surgeries you have had. Any medical conditions you have. Whether you are pregnant or may be pregnant. What are the risks? Generally, this is a safe test. However, problems may occur, including an allergic reaction to dye (contrast) that may be used during the test. What happens before the test? No specific preparation is needed. You may eat and drink normally. What happens during the test?  You will take off your clothes from the waist up and put on a hospital gown. Electrodes or electrocardiogram (ECG)patches may be placed on your chest. The electrodes or patches are then connected to a device that monitors your heart rate and rhythm. You will lie down on a table for an ultrasound exam. A gel will be applied to your chest to help sound waves pass through your skin. A handheld device, called a transducer, will be pressed against your chest and moved over your heart. The transducer produces sound waves that travel to your heart and bounce back (or "echo" back) to the transducer. These sound waves will be captured in real-time and changed into images of your heart that can be viewed on a video monitor. The images will be recorded on a computer and reviewed by your health care provider. You may be asked to change positions or hold your breath for a short time.  This makes it easier to get different views or better views of your heart. In some cases, you may receive contrast through an IV in one of your veins. This can improve the quality of the pictures from your heart. The procedure may vary among health care providers and hospitals. What  can I expect after the test? You may return to your normal, everyday life, including diet, activities, and medicines, unless your health care provider tells you not to do that. Follow these instructions at home: It is up to you to get the results of your test. Ask your health care provider, or the department that is doing the test, when your results will be ready. Keep all follow-up visits. This is important. Summary An echocardiogram is a test that uses sound waves (ultrasound) to produce images of the heart. Images from an echocardiogram can provide important information about the size and shape of your heart, heart muscle function, heart valve function, and other possible heart problems. You do not need to do anything to prepare before this test. You may eat and drink normally. After the echocardiogram is completed, you may return to your normal, everyday life, unless your health care provider tells you not to do that. This information is not intended to replace advice given to you by your health care provider. Make sure you discuss any questions you have with your health care provider. Document Revised: 05/16/2021 Document Reviewed: 04/25/2020 Elsevier Patient Education  [place]         Signed, Kate Sable, MD  04/15/2022 3:50 PM    [place]

## 2022-04-15 NOTE — Patient Instructions (Signed)
Medication Instructions:   Your physician recommends that you continue on your current medications as directed. Please refer to the Current Medication list given to you today.   *If you need a refill on your cardiac medications before your next appointment, please call your pharmacy*   Lab Work: None ordered  If you have labs (blood work) drawn today and your tests are completely normal, you will receive your results only by: [place] (if you have MyChart) OR A paper copy in the mail If you have any lab test that is abnormal or we need to change your treatment, we will call you to review the results.   Testing/Procedures:  1) Echocardiogram  Your physician has requested that you have an echocardiogram. Echocardiography is a painless test that uses sound waves to create images of your heart. It provides your doctor with information about the size and shape of your heart and how well your heart's chambers and valves are working. This procedure takes approximately one hour. There are no restrictions for this procedure. There is a possibility that an IV may need to be started during your test to inject an image enhancing agent. This is done to obtain more optimal pictures of your heart. Therefore we ask that you do at least drink some water prior to coming in to hydrate your veins.    2) Heart Monitor:  Length of Wear: 2 weeks  Your monitor will be mailed to your home address within 3-5 business days. However, if you have not received your monitor after 5 business days please send [place] a MyChart message or call the office at [phone], so we may follow up on this for you.   Your physician has recommended that you wear a Zio XT monitor.   This monitor is a medical device that records the heart's electrical activity. Doctors most often use these monitors to diagnose arrhythmias. Arrhythmias are problems with the speed or rhythm of the heartbeat. The monitor is a small device  applied to your chest. You can wear one while you do your normal daily activities. While wearing this monitor if you have any symptoms to push the button and record what you felt. Once you have worn this monitor for the period of time provider prescribed (Usually 14 days), you will return the monitor device in the postage paid box. Once it is returned they will download the data collected and provide [place] with a report which the provider will then review and we will call you with those results. Important tips:  Avoid showering during the first 24 hours of wearing the monitor. Avoid excessive sweating to help maximize wear time. Do not submerge the device, no hot tubs, and no swimming pools. Keep any lotions or oils away from the patch. After 24 hours you may shower with the patch on. Take brief showers with your back facing the shower head.  Do not remove patch once it has been placed because that will interrupt data and decrease adhesive wear time. Push the button when you have any symptoms and write down what you were feeling. Once you have completed wearing your monitor, remove and place into box which has postage paid and place in your outgoing mailbox.  If for some reason you have misplaced your box then call our office and we can provide another box and/or mail it off for you.       Follow-Up: At [facility], you and your health needs are our priority.  As  part of our continuing mission to provide you with exceptional heart care, we have created designated Provider Care Teams.  These Care Teams include your primary Cardiologist (physician) and Advanced Practice Providers (APPs -  Physician Assistants and Nurse Practitioners) who all work together to provide you with the care you need, when you need it.  We recommend signing up for the patient portal called "MyChart".  Sign up information is provided on this After Visit Summary.  MyChart is used to connect with patients for Virtual Visits  (Telemedicine).  Patients are able to view lab/test results, encounter notes, upcoming appointments, etc.  Non-urgent messages can be sent to your provider as well.   To learn more about what you can do with MyChart, go to [email or web address].    Your next appointment:   5 week(s)  The format for your next appointment:   In Person  Provider:   You may see Kate Sable, MD or one of the following Advanced Practice Providers on your designated Care Team:   Murray Hodgkins, NP Christell Faith, PA-C Cadence Kathlen Mody, PA-C    Other Instructions Echocardiogram An echocardiogram is a test that uses sound waves (ultrasound) to produce images of the heart. Images from an echocardiogram can provide important information about: Heart size and shape. The size and thickness and movement of your heart's walls. Heart muscle function and strength. Heart valve function or if you have stenosis. Stenosis is when the heart valves are too narrow. If blood is flowing backward through the heart valves (regurgitation). A tumor or infectious growth around the heart valves. Areas of heart muscle that are not working well because of poor blood flow or injury from a heart attack. Aneurysm detection. An aneurysm is a weak or damaged part of an artery wall. The wall bulges out from the normal force of blood pumping through the body. Tell a health care provider about: Any allergies you have. All medicines you are taking, including vitamins, herbs, eye drops, creams, and over-the-counter medicines. Any blood disorders you have. Any surgeries you have had. Any medical conditions you have. Whether you are pregnant or may be pregnant. What are the risks? Generally, this is a safe test. However, problems may occur, including an allergic reaction to dye (contrast) that may be used during the test. What happens before the test? No specific preparation is needed. You may eat and drink normally. What happens during  the test?  You will take off your clothes from the waist up and put on a hospital gown. Electrodes or electrocardiogram (ECG)patches may be placed on your chest. The electrodes or patches are then connected to a device that monitors your heart rate and rhythm. You will lie down on a table for an ultrasound exam. A gel will be applied to your chest to help sound waves pass through your skin. A handheld device, called a transducer, will be pressed against your chest and moved over your heart. The transducer produces sound waves that travel to your heart and bounce back (or "echo" back) to the transducer. These sound waves will be captured in real-time and changed into images of your heart that can be viewed on a video monitor. The images will be recorded on a computer and reviewed by your health care provider. You may be asked to change positions or hold your breath for a short time. This makes it easier to get different views or better views of your heart. In some cases, you may receive contrast through an IV  in one of your veins. This can improve the quality of the pictures from your heart. The procedure may vary among health care providers and hospitals. What can I expect after the test? You may return to your normal, everyday life, including diet, activities, and medicines, unless your health care provider tells you not to do that. Follow these instructions at home: It is up to you to get the results of your test. Ask your health care provider, or the department that is doing the test, when your results will be ready. Keep all follow-up visits. This is important. Summary An echocardiogram is a test that uses sound waves (ultrasound) to produce images of the heart. Images from an echocardiogram can provide important information about the size and shape of your heart, heart muscle function, heart valve function, and other possible heart problems. You do not need to do anything to prepare before this  test. You may eat and drink normally. After the echocardiogram is completed, you may return to your normal, everyday life, unless your health care provider tells you not to do that. This information is not intended to replace advice given to you by your health care provider. Make sure you discuss any questions you have with your health care provider. Document Revised: 05/16/2021 Document Reviewed: 04/25/2020 Elsevier Patient Education  [place]

## 2022-04-16 ENCOUNTER — Other Ambulatory Visit: Payer: Self-pay

## 2022-04-22 ENCOUNTER — Ambulatory Visit
Admission: RE | Admit: 2022-04-22 | Discharge: 2022-04-22 | Disposition: A | Discharge status: Home / Self Care | Payer: Medicare Other | Source: Ambulatory Visit | Attending: Radiation Oncology | Admitting: Radiation Oncology

## 2022-04-22 ENCOUNTER — Other Ambulatory Visit: Payer: Self-pay

## 2022-04-22 DIAGNOSIS — C3412 Malignant neoplasm of upper lobe, left bronchus or lung: Secondary | ICD-10-CM | POA: Diagnosis present

## 2022-04-22 DIAGNOSIS — C3402 Malignant neoplasm of left main bronchus: Secondary | ICD-10-CM | POA: Diagnosis present

## 2022-04-22 LAB — RAD ONC ARIA SESSION SUMMARY
Course Elapsed Days: 61
Plan Fractions Treated to Date: 1
Plan Prescribed Dose Per Fraction: 12 Gy
Plan Total Fractions Prescribed: 5
Plan Total Prescribed Dose: 60 Gy
Reference Point Dosage Given to Date: 12 Gy
Reference Point Session Dosage Given: 12 Gy
Session Number: 36

## 2022-04-24 ENCOUNTER — Ambulatory Visit
Admission: RE | Admit: 2022-04-24 | Discharge: 2022-04-24 | Disposition: A | Discharge status: Home / Self Care | Payer: Medicare Other | Source: Ambulatory Visit | Attending: Radiation Oncology | Admitting: Radiation Oncology

## 2022-04-24 ENCOUNTER — Other Ambulatory Visit: Payer: Self-pay

## 2022-04-24 DIAGNOSIS — C3402 Malignant neoplasm of left main bronchus: Secondary | ICD-10-CM | POA: Diagnosis not present

## 2022-04-24 LAB — RAD ONC ARIA SESSION SUMMARY
Course Elapsed Days: 63
Plan Fractions Treated to Date: 2
Plan Prescribed Dose Per Fraction: 12 Gy
Plan Total Fractions Prescribed: 5
Plan Total Prescribed Dose: 60 Gy
Reference Point Dosage Given to Date: 24 Gy
Reference Point Session Dosage Given: 12 Gy
Session Number: 37

## 2022-04-25 ENCOUNTER — Inpatient Hospital Stay: Payer: Medicare Other | Attending: Internal Medicine

## 2022-04-25 DIAGNOSIS — C3402 Malignant neoplasm of left main bronchus: Secondary | ICD-10-CM | POA: Diagnosis present

## 2022-04-25 DIAGNOSIS — C3411 Malignant neoplasm of upper lobe, right bronchus or lung: Secondary | ICD-10-CM | POA: Diagnosis present

## 2022-04-25 DIAGNOSIS — Z95828 Presence of other vascular implants and grafts: Secondary | ICD-10-CM

## 2022-04-25 LAB — CBC WITH DIFFERENTIAL/PLATELET
Abs Immature Granulocytes: 0.01 10*3/uL (ref 0.00–0.07)
Basophils Absolute: 0 10*3/uL (ref 0.0–0.1)
Basophils Relative: 1 %
Eosinophils Absolute: 0.1 10*3/uL (ref 0.0–0.5)
Eosinophils Relative: 2 %
HCT: 38.6 % — ABNORMAL LOW (ref 39.0–52.0)
Hemoglobin: 12.3 g/dL — ABNORMAL LOW (ref 13.0–17.0)
Immature Granulocytes: 0 %
Lymphocytes Relative: 17 %
Lymphs Abs: 0.6 10*3/uL — ABNORMAL LOW (ref 0.7–4.0)
MCH: 30.2 pg (ref 26.0–34.0)
MCHC: 31.9 g/dL (ref 30.0–36.0)
MCV: 94.8 fL (ref 80.0–100.0)
Monocytes Absolute: 1 10*3/uL (ref 0.1–1.0)
Monocytes Relative: 30 %
Neutro Abs: 1.6 10*3/uL — ABNORMAL LOW (ref 1.7–7.7)
Neutrophils Relative %: 50 %
Platelets: 133 10*3/uL — ABNORMAL LOW (ref 150–400)
RBC: 4.07 MIL/uL — ABNORMAL LOW (ref 4.22–5.81)
RDW: 16.7 % — ABNORMAL HIGH (ref 11.5–15.5)
WBC: 3.2 10*3/uL — ABNORMAL LOW (ref 4.0–10.5)
nRBC: 0 % (ref 0.0–0.2)

## 2022-04-25 LAB — COMPREHENSIVE METABOLIC PANEL
ALT: 18 U/L (ref 0–44)
AST: 22 U/L (ref 15–41)
Albumin: 3.8 g/dL (ref 3.5–5.0)
Alkaline Phosphatase: 64 U/L (ref 38–126)
Anion gap: 7 (ref 5–15)
BUN: 8 mg/dL (ref 8–23)
CO2: 27 mmol/L (ref 22–32)
Calcium: 8.9 mg/dL (ref 8.9–10.3)
Chloride: 106 mmol/L (ref 98–111)
Creatinine, Ser: 0.99 mg/dL (ref 0.61–1.24)
GFR, Estimated: >60 mL/min (ref >60)
Glucose, Bld: 105 mg/dL — ABNORMAL HIGH (ref 70–99)
Potassium: 3.7 mmol/L (ref 3.5–5.1)
Sodium: 140 mmol/L (ref 135–145)
Total Bilirubin: 0.4 mg/dL (ref 0.3–1.2)
Total Protein: 6.7 g/dL (ref 6.5–8.1)

## 2022-04-25 MED ORDER — SODIUM CHLORIDE 0.9% FLUSH
10.0000 mL | INTRAVENOUS | Status: DC | PRN
  Administered 2022-04-25: 10 mL via INTRAVENOUS
  Filled 2022-04-25: qty 10

## 2022-04-25 MED ORDER — HEPARIN SOD (PORK) LOCK FLUSH 100 UNIT/ML IV SOLN
500.0000 [IU] | Freq: Once | INTRAVENOUS | Status: AC
  Administered 2022-04-25: 500 [IU] via INTRAVENOUS
  Filled 2022-04-25: qty 5

## 2022-04-29 ENCOUNTER — Ambulatory Visit
Admission: RE | Admit: 2022-04-29 | Discharge: 2022-04-29 | Disposition: A | Discharge status: Home / Self Care | Payer: Medicare Other | Source: Ambulatory Visit | Attending: Radiation Oncology | Admitting: Radiation Oncology

## 2022-04-29 ENCOUNTER — Other Ambulatory Visit: Payer: Self-pay

## 2022-04-29 DIAGNOSIS — C3402 Malignant neoplasm of left main bronchus: Secondary | ICD-10-CM | POA: Diagnosis not present

## 2022-04-29 LAB — RAD ONC ARIA SESSION SUMMARY
Course Elapsed Days: 68
Plan Fractions Treated to Date: 3
Plan Prescribed Dose Per Fraction: 12 Gy
Plan Total Fractions Prescribed: 5
Plan Total Prescribed Dose: 60 Gy
Reference Point Dosage Given to Date: 36 Gy
Reference Point Session Dosage Given: 12 Gy
Session Number: 38

## 2022-05-01 ENCOUNTER — Other Ambulatory Visit: Payer: Self-pay

## 2022-05-01 ENCOUNTER — Ambulatory Visit
Admission: RE | Admit: 2022-05-01 | Discharge: 2022-05-01 | Disposition: A | Discharge status: Home / Self Care | Payer: Medicare Other | Source: Ambulatory Visit | Attending: Radiation Oncology | Admitting: Radiation Oncology

## 2022-05-01 DIAGNOSIS — C3402 Malignant neoplasm of left main bronchus: Secondary | ICD-10-CM | POA: Diagnosis not present

## 2022-05-01 LAB — RAD ONC ARIA SESSION SUMMARY
Course Elapsed Days: 70
Plan Fractions Treated to Date: 4
Plan Prescribed Dose Per Fraction: 12 Gy
Plan Total Fractions Prescribed: 5
Plan Total Prescribed Dose: 60 Gy
Reference Point Dosage Given to Date: 48 Gy
Reference Point Session Dosage Given: 12 Gy
Session Number: 39

## 2022-05-06 ENCOUNTER — Ambulatory Visit
Admission: RE | Admit: 2022-05-06 | Discharge: 2022-05-06 | Disposition: A | Discharge status: Home / Self Care | Payer: Medicare Other | Source: Ambulatory Visit | Attending: Radiation Oncology | Admitting: Radiation Oncology

## 2022-05-06 ENCOUNTER — Other Ambulatory Visit: Payer: Self-pay

## 2022-05-06 DIAGNOSIS — C3402 Malignant neoplasm of left main bronchus: Secondary | ICD-10-CM | POA: Diagnosis not present

## 2022-05-06 LAB — RAD ONC ARIA SESSION SUMMARY
Course Elapsed Days: 75
Plan Fractions Treated to Date: 5
Plan Prescribed Dose Per Fraction: 12 Gy
Plan Total Fractions Prescribed: 5
Plan Total Prescribed Dose: 60 Gy
Reference Point Dosage Given to Date: 60 Gy
Reference Point Session Dosage Given: 12 Gy
Session Number: 40

## 2022-05-08 ENCOUNTER — Encounter: Payer: Self-pay | Admitting: Cardiology

## 2022-05-15 ENCOUNTER — Ambulatory Visit: Payer: Medicare Other | Attending: Cardiology

## 2022-05-15 DIAGNOSIS — Z87891 Personal history of nicotine dependence: Secondary | ICD-10-CM | POA: Insufficient documentation

## 2022-05-15 DIAGNOSIS — I2510 Atherosclerotic heart disease of native coronary artery without angina pectoris: Secondary | ICD-10-CM | POA: Diagnosis present

## 2022-05-15 DIAGNOSIS — I340 Nonrheumatic mitral (valve) insufficiency: Secondary | ICD-10-CM | POA: Insufficient documentation

## 2022-05-15 DIAGNOSIS — E785 Hyperlipidemia, unspecified: Secondary | ICD-10-CM | POA: Insufficient documentation

## 2022-05-15 DIAGNOSIS — I10 Essential (primary) hypertension: Secondary | ICD-10-CM | POA: Diagnosis not present

## 2022-05-15 DIAGNOSIS — Z9221 Personal history of antineoplastic chemotherapy: Secondary | ICD-10-CM | POA: Insufficient documentation

## 2022-05-15 DIAGNOSIS — J449 Chronic obstructive pulmonary disease, unspecified: Secondary | ICD-10-CM | POA: Insufficient documentation

## 2022-05-15 DIAGNOSIS — E119 Type 2 diabetes mellitus without complications: Secondary | ICD-10-CM | POA: Diagnosis not present

## 2022-05-15 LAB — ECHOCARDIOGRAM COMPLETE
AR max vel: 2.6 cm2
AV Area VTI: 3.14 cm2
AV Area mean vel: 2.92 cm2
AV Mean grad: 3 mmHg
AV Peak grad: 7.1 mmHg
Ao pk vel: 1.33 m/s
Area-P 1/2: 3.6 cm2
S' Lateral: 3.4 cm

## 2022-05-15 MED ORDER — PERFLUTREN LIPID MICROSPHERE
1.0000 mL | INTRAVENOUS | Status: AC | PRN
  Administered 2022-05-15: 2 mL via INTRAVENOUS

## 2022-05-16 ENCOUNTER — Ambulatory Visit: Admission: RE | Admit: 2022-05-16 | Payer: Medicare Other | Source: Ambulatory Visit | Admitting: Radiation Oncology

## 2022-05-17 DIAGNOSIS — R002 Palpitations: Secondary | ICD-10-CM | POA: Diagnosis not present

## 2022-05-31 ENCOUNTER — Telehealth: Payer: Self-pay

## 2022-05-31 MED ORDER — METOPROLOL SUCCINATE ER 25 MG PO TB24: 25.0000 mg | ORAL_TABLET | Freq: Every day | ORAL | 3 refills | Status: AC

## 2022-05-31 NOTE — Telephone Encounter (Signed)
5 beat run of VT noted. 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  Start Toprol-XL 25 mg daily to help suppress PVCs.

## 2022-06-12 ENCOUNTER — Encounter: Payer: Self-pay | Admitting: Radiation Oncology

## 2022-06-12 ENCOUNTER — Ambulatory Visit
Admission: RE | Admit: 2022-06-12 | Discharge: 2022-06-12 | Disposition: A | Discharge status: Home / Self Care | Payer: Medicare Other | Source: Ambulatory Visit | Attending: Radiation Oncology | Admitting: Radiation Oncology

## 2022-06-12 VITALS — BP 149/87 | HR 62 | Temp 96.0°F | Resp 18 | Ht 69.0 in | Wt 221.0 lb

## 2022-06-12 DIAGNOSIS — Z923 Personal history of irradiation: Secondary | ICD-10-CM | POA: Insufficient documentation

## 2022-06-12 DIAGNOSIS — C3411 Malignant neoplasm of upper lobe, right bronchus or lung: Secondary | ICD-10-CM

## 2022-06-12 DIAGNOSIS — H9202 Otalgia, left ear: Secondary | ICD-10-CM | POA: Diagnosis not present

## 2022-06-12 DIAGNOSIS — C3492 Malignant neoplasm of unspecified part of left bronchus or lung: Secondary | ICD-10-CM | POA: Diagnosis present

## 2022-06-12 NOTE — Progress Notes (Signed)
Radiation Oncology Follow up Note  Name: Louis Sullivan   Date:   06/12/2022 MRN:  [id number] DOB: 07-03-1951    This 70 y.o. male presents to the clinic today for 1 month follow-up status post.  Concurrent chemoradiation therapy for stage IIIa disease of the left lung as well as SBRT to his right lung.  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  Seen today in routine follow-up he is doing well.  Specifically [place] cough hemoptysis or chest tightness.  He does have some what describes as ear pain in his left ear we will see his GP for that..  COMPLICATIONS OF TREATMENT: none  FOLLOW UP COMPLIANCE: keeps appointments   PHYSICAL EXAM:  BP (!) 149/87   Pulse 62   Temp (!) 96 F (35.6 C)   Resp 18   Ht 5\' 9"  (1.753 m)   Wt 221 lb (100.2 kg)   BMI 32.64 kg/m  Well-developed well-nourished patient in NAD. HEENT reveals PERLA, EOMI, discs not visualized.  Oral cavity is clear. No oral mucosal lesions are identified. Neck is clear without evidence of cervical or supraclavicular adenopathy. Lungs are clear to A&P. Cardiac examination is essentially unremarkable with regular rate and rhythm without murmur rub or thrill. Abdomen is benign with no organomegaly or masses noted. Motor sensory and DTR levels are equal and symmetric in the upper and lower extremities. Cranial nerves II through XII are grossly intact. Proprioception is intact. No peripheral adenopathy or edema is identified. No motor or sensory levels are noted. Crude visual fields are within normal range.  RADIOLOGY RESULTS: CT scan of chest ordered for early January  PLAN: Present time patient is doing well clinically very low side effect profile from radiation therapy to both lungs.  Pleased with his  overall progress.  I have asked to see him back in 3 months for follow-up.  He is a CT scan of his chest scheduled for early January which I will review.  Patient continues close follow-up care with medical oncology.  Patient is to call with any concerns.  I would like to take this opportunity to thank you for allowing me to participate in the care of your patient.Noreene Filbert, MD

## 2022-06-13 ENCOUNTER — Encounter: Payer: Self-pay | Admitting: Cardiology

## 2022-06-13 ENCOUNTER — Other Ambulatory Visit
Admission: RE | Admit: 2022-06-13 | Discharge: 2022-06-13 | Disposition: A | Discharge status: Home / Self Care | Payer: Medicare Other | Source: Ambulatory Visit | Attending: Cardiology | Admitting: Cardiology

## 2022-06-13 ENCOUNTER — Inpatient Hospital Stay: Payer: Medicare Other

## 2022-06-13 ENCOUNTER — Ambulatory Visit: Payer: Medicare Other | Attending: Cardiology | Admitting: Cardiology

## 2022-06-13 ENCOUNTER — Other Ambulatory Visit: Payer: Self-pay

## 2022-06-13 VITALS — BP 112/68 | HR 65 | Ht 69.0 in | Wt 222.8 lb

## 2022-06-13 DIAGNOSIS — I493 Ventricular premature depolarization: Secondary | ICD-10-CM | POA: Insufficient documentation

## 2022-06-13 DIAGNOSIS — I10 Essential (primary) hypertension: Secondary | ICD-10-CM | POA: Diagnosis not present

## 2022-06-13 DIAGNOSIS — I2510 Atherosclerotic heart disease of native coronary artery without angina pectoris: Secondary | ICD-10-CM | POA: Diagnosis not present

## 2022-06-13 DIAGNOSIS — E785 Hyperlipidemia, unspecified: Secondary | ICD-10-CM | POA: Diagnosis present

## 2022-06-13 LAB — LIPID PANEL
Cholesterol: 157 mg/dL (ref 0–200)
HDL: 55 mg/dL (ref >40)
LDL Cholesterol: 90 mg/dL (ref 0–99)
Total CHOL/HDL Ratio: 2.9 RATIO
Triglycerides: 61 mg/dL (ref <150)
VLDL: 12 mg/dL (ref 0–40)

## 2022-06-13 NOTE — Progress Notes (Signed)
Cardiology Office Note:    Date:  06/13/2022   ID:  [name], DOB 1951-02-28, MRN [id number]  PCP:  [facility] Prov[facility] Cardiologist:  None     Referring MD: [facility]   Chief Complaint  Patient presents with   Follow-up    Echo Follow up, warm feeling down neck left side    History of Present Illness:    Louis Sullivan is a 71 y.o. male with a hx of CAD (chest CT 5/23, 3V coronary Ca2+ ) ,hypertension, hyperlipidemia, former smoker x40 years, COPD, lung cancer s/p chemo plus radiation who presents for follow-up.  Previously seen due to irregular heartbeats.  Cardiac monitor was placed to evaluate any significant arrhythmias.  Echocardiogram obtained due to three-vessel coronary artery calcification/CAD.  Denies chest pain, compliant with aspirin, Lipitor, losartan.  States taking losartan 150 mg daily over the past 6 months.  Toprol-XL was started after cardiac monitor showed frequent PVCs.  Tolerating medications with no adverse effects.  Prior notes Echo 04/2022 EF 55 to 60% Cardiac monitor 05/2022 frequent PVCs, 5.1% burden.      Past Medical History:  Diagnosis Date   COPD (chronic obstructive pulmonary disease) ([place])    Coronary artery disease    1 stent placed in about 2008   Diabetes mellitus without complication (HCC)    Dyspnea    Headache    migraines   Hyperlipidemia    Hypertension    Pneumonia 12/2021   hospitalized at [facility]    Past Surgical History:  Procedure Laterality Date   CARDIAC CATHETERIZATION     COLONOSCOPY     IR IMAGING GUIDED PORT INSERTION  02/12/2022   VIDEO BRONCHOSCOPY WITH ENDOBRONCHIAL NAVIGATION N/A 01/23/2022   Procedure: [place];  Surgeon: Ottie Glazier, MD;  Location: [facility] ORS;  Service: Thoracic;  Laterality: N/A;   VIDEO BRONCHOSCOPY WITH ENDOBRONCHIAL ULTRASOUND N/A 01/23/2022   Procedure: VIDEO BRONCHOSCOPY WITH ENDOBRONCHIAL  ULTRASOUND;  Surgeon: Ottie Glazier, MD;  Location: [facility] ORS;  Service: Thoracic;  Laterality: N/A;    Current Medications: Current Meds  Medication Sig   albuterol (PROVENTIL) (5 MG/ML) 0.5% nebulizer solution Take 2.5 mg by nebulization every morning.   albuterol (VENTOLIN HFA) 108 (90 Base) MCG/ACT inhaler Inhale 2 puffs into the lungs every 4 (four) hours as needed for wheezing or shortness of breath.   aspirin EC 81 MG tablet Take 81 mg by mouth daily.   atorvastatin (LIPITOR) 40 MG tablet Take 40 mg by mouth every morning.   blood glucose meter kit and supplies KIT Dispense based on patient and insurance preference. Use up to four times daily as directed.   Cholecalciferol (VITAMIN D3 PO) Take 1 tablet by mouth daily at 6 (six) AM.   hydrocortisone 2.5 % ointment Apply topically 2 (two) times daily. Apply to skin rash twice a day as needed for itch.   lidocaine-prilocaine (EMLA) cream Apply on the port. 30 -45 min  prior to port access.   losartan (COZAAR) 100 MG tablet Take 100 mg by mouth every evening.   metFORMIN (GLUCOPHAGE) 1000 MG tablet Take 1,000 mg by mouth daily with breakfast.   metoprolol succinate (TOPROL XL) 25 MG 24 hr tablet Take 1 tablet (25 mg total) by mouth daily.   Nicotine 21-14-7 MG/24HR KIT Place 1 patch onto the skin daily.   ondansetron (ZOFRAN) 8 MG tablet One pill every 8 hours as needed for nausea/vomitting.  OXYGEN Inhale 2 L into the lungs at bedtime.   predniSONE (STERAPRED UNI-PAK 21 TAB) 10 MG (21) TBPK tablet Day 1: 2 tab before breakfast, 1 after lunch, 1 after dinner, & 2 at bedtime Day 2: 1 tab before breakfast, 1 after lunch, 1 after dinner, & 2 at bedtime Day 3: 1 tab before breakfast, 1 after lunch, 1 after dinner, & 1 at bedtime Day 4: 1 tab before breakfast, 1 after lunch, & 1 at bedtime Day 5: 1 tab before breakfast & 1 at bedtime Day 6: 1 tablet before breakfast   prochlorperazine (COMPAZINE) 10 MG tablet TAKE 1 TABLET(10 MG) BY MOUTH EVERY  6 HOURS AS NEEDED FOR NAUSEA OR VOMITING   traZODone (DESYREL) 50 MG tablet Take 50 mg by mouth at bedtime.   [DISCONTINUED] losartan (COZAAR) 50 MG tablet Take 50 mg by mouth in the morning.     Allergies:   Paclitaxel   Social History   Socioeconomic History   Marital status: Widowed    Spouse name: Not on file   Number of children: Not on file   Years of education: Not on file   Highest education level: Not on file  Occupational History   Not on file  Tobacco Use   Smoking status: Former    Packs/day: 1.00    Years: 20.00    Total pack years: 20.00    Types: Cigarettes    Quit date: 02/11/2022    Years since quitting: 0.3   Smokeless tobacco: Never   Tobacco comments:    Request to MD for Nicotine patch to help him quit smoking.  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 Lives in [place] self. Son passed in 2021;MI wife passed away in 1990s/aneurysm; daughter in [place].    Social Determinants of Health   Financial Resource Strain: Not on file  Food Insecurity: Not on file  Transportation Needs: Not on file  Physical Activity: Not on file  Stress: Not on file  Social Connections: Not on file     Family History: The patient's family history includes Throat cancer in his brother.  ROS:   Please see the history of present illness.     All other systems reviewed and are negative.  EKGs/Labs/Other Studies Reviewed:    The following studies were reviewed today:   EKG:  EKG is  ordered today.  The ekg ordered today demonstrates sinus rhythm, occasional PVCs.  Recent Labs: 02/01/2022: TSH 1.091 04/25/2022: ALT 18; BUN 8; Creatinine, Ser 0.99; Hemoglobin 12.3; Platelets 133; Potassium 3.7; Sodium 140  Recent Lipid Panel No results found for:  "CHOL", "TRIG", "HDL", "CHOLHDL", "VLDL", "LDLCALC", "LDLDIRECT"   Risk Assessment/Calculations:        Physical Exam:    VS:  BP 112/68 (BP Location: Left Arm, Patient Position: Sitting, Cuff Size: Large)   Pulse 65   Ht 5' 9"  (1.753 m)   Wt 222 lb 12.8 oz (101.1 kg)   SpO2 93% Comment: 2 liter PM  BMI 32.90 kg/m     Wt Readings from Last 3 Encounters:  06/13/22 222 lb 12.8 oz (101.1 kg)  06/12/22 221 lb (100.2 kg)  04/15/22 216 lb 9.6 oz (98.2 kg)     GEN:  Well  nourished, well developed in no acute distress HEENT: Normal NECK: No JVD; No carotid bruits CARDIAC: RRR, no murmurs, rubs, gallops RESPIRATORY: Bilateral wheezing. ABDOMEN: Soft, non-tender, non-distended MUSCULOSKELETAL:  No edema; No deformity  SKIN: Warm and dry NEUROLOGIC:  Alert and oriented x 3 PSYCHIATRIC:  Normal affect   ASSESSMENT:    1. Frequent PVCs   2. Coronary artery disease involving native coronary artery of native heart without angina pectoris   3. Primary hypertension   4. Hyperlipidemia LDL goal <70    PLAN:    In order of problems listed above:  Irregular heartbeat, cardiac monitor shows frequent PVCs, 5.1% burden.  Echo 8/23 EF 55 to 60%. CAD, three-vessel calcifications on chest CT.  Denies chest pain.  Continue aspirin, Lipitor.  EF 55 to 60%.  Obtain fasting lipid profile today. Hypertension, BP controlled.  Advised to take losartan 100 mg daily, Toprol-XL 25 mg daily. LDL goal less than 70.  Obtain fasting lipid profile.  Continue Lipitor.     Follow-up in 6 months.  Medication Adjustments/Labs and Tests Ordered: Current medicines are reviewed at length with the patient today.  Concerns regarding medicines are outlined above.  Orders Placed This Encounter  Procedures   Lipid panel   EKG 12-Lead   No orders of the defined types were placed in this encounter.   Patient Instructions  Medication Instructions:   Your physician has recommended you make the following  change in your medication:    Take Losartan 100 MG once a day.  *If you need a refill on your cardiac medications before your next appointment, please call your pharmacy*   Lab Work:  Please go to the [facility] for a Fasting Lipid profile lab draw after your office visit today.    Follow-Up: At [facility], you and your health needs are our priority.  As part of our continuing mission to provide you with exceptional heart care, we have created designated Provider Care Teams.  These Care Teams include your primary Cardiologist (physician) and Advanced Practice Providers (APPs -  Physician Assistants and Nurse Practitioners) who all work together to provide you with the care you need, when you need it.  We recommend signing up for the patient portal called "MyChart".  Sign up information is provided on this After Visit Summary.  MyChart is used to connect with patients for Virtual Visits (Telemedicine).  Patients are able to view lab/test results, encounter notes, upcoming appointments, etc.  Non-urgent messages can be sent to your provider as well.   To learn more about what you can do with MyChart, go to [email or web address].    Your next appointment:   6 month(s)  The format for your next appointment:   In Person  Provider:   You may see Kate Sable, MD or one of the following Advanced Practice Providers on your designated Care Team:   Murray Hodgkins, NP Christell Faith, PA-C Cadence Kathlen Mody, PA-C Gerrie Nordmann, NP    Other Instructions   Important Information About Sugar         Signed, Kate Sable, MD  06/13/2022 9:18 AM    [place]

## 2022-06-13 NOTE — Patient Instructions (Signed)
Medication Instructions:   Your physician has recommended you make the following change in your medication:    Take Losartan 100 MG once a day.  *If you need a refill on your cardiac medications before your next appointment, please call your pharmacy*   Lab Work:  Please go to the [facility] for a Fasting Lipid profile lab draw after your office visit today.    Follow-Up: At [facility], you and your health needs are our priority.  As part of our continuing mission to provide you with exceptional heart care, we have created designated Provider Care Teams.  These Care Teams include your primary Cardiologist (physician) and Advanced Practice Providers (APPs -  Physician Assistants and Nurse Practitioners) who all work together to provide you with the care you need, when you need it.  We recommend signing up for the patient portal called "MyChart".  Sign up information is provided on this After Visit Summary.  MyChart is used to connect with patients for Virtual Visits (Telemedicine).  Patients are able to view lab/test results, encounter notes, upcoming appointments, etc.  Non-urgent messages can be sent to your provider as well.   To learn more about what you can do with MyChart, go to [email or web address].    Your next appointment:   6 month(s)  The format for your next appointment:   In Person  Provider:   You may see Kate Sable, MD or one of the following Advanced Practice Providers on your designated Care Team:   Murray Hodgkins, NP Christell Faith, PA-C Cadence Kathlen Mody, PA-C Gerrie Nordmann, NP    Other Instructions   Important Information About Sugar

## 2022-06-14 ENCOUNTER — Telehealth: Payer: Self-pay

## 2022-06-14 ENCOUNTER — Other Ambulatory Visit: Payer: Self-pay

## 2022-06-14 MED ORDER — ATORVASTATIN CALCIUM 80 MG PO TABS: 80.0000 mg | ORAL_TABLET | ORAL | 1 refills | Status: AC

## 2022-06-14 NOTE — Telephone Encounter (Signed)
The patient has been notified of the result and verbalized understanding.  All questions (if any) were answered. [place], RN 06/14/2022 1:20 PM

## 2022-06-14 NOTE — Telephone Encounter (Signed)
-----   Message from Kate Sable, MD sent at 06/14/2022 12:53 PM EDT ----- Cholesterol controlled overall but not at goal.  Continue low-cholesterol diet.  Increase Lipitor to 80 mg daily.

## 2022-06-19 ENCOUNTER — Encounter: Payer: Self-pay | Admitting: Internal Medicine

## 2022-06-19 ENCOUNTER — Inpatient Hospital Stay: Payer: Medicare Other | Attending: Internal Medicine

## 2022-06-19 ENCOUNTER — Inpatient Hospital Stay (HOSPITAL_BASED_OUTPATIENT_CLINIC_OR_DEPARTMENT_OTHER): Payer: Medicare Other | Admitting: Internal Medicine

## 2022-06-19 ENCOUNTER — Ambulatory Visit: Payer: Medicare Other

## 2022-06-19 ENCOUNTER — Other Ambulatory Visit: Payer: Self-pay

## 2022-06-19 VITALS — BP 146/81 | HR 67 | Temp 96.8°F | Resp 16 | Wt 221.0 lb

## 2022-06-19 DIAGNOSIS — T451X5A Adverse effect of antineoplastic and immunosuppressive drugs, initial encounter: Secondary | ICD-10-CM | POA: Diagnosis not present

## 2022-06-19 DIAGNOSIS — Z5112 Encounter for antineoplastic immunotherapy: Secondary | ICD-10-CM | POA: Insufficient documentation

## 2022-06-19 DIAGNOSIS — R5383 Other fatigue: Secondary | ICD-10-CM

## 2022-06-19 DIAGNOSIS — K5909 Other constipation: Secondary | ICD-10-CM | POA: Insufficient documentation

## 2022-06-19 DIAGNOSIS — C3411 Malignant neoplasm of upper lobe, right bronchus or lung: Secondary | ICD-10-CM

## 2022-06-19 DIAGNOSIS — Z79899 Other long term (current) drug therapy: Secondary | ICD-10-CM | POA: Insufficient documentation

## 2022-06-19 DIAGNOSIS — Z87891 Personal history of nicotine dependence: Secondary | ICD-10-CM | POA: Diagnosis not present

## 2022-06-19 DIAGNOSIS — J449 Chronic obstructive pulmonary disease, unspecified: Secondary | ICD-10-CM | POA: Insufficient documentation

## 2022-06-19 DIAGNOSIS — C3402 Malignant neoplasm of left main bronchus: Secondary | ICD-10-CM | POA: Insufficient documentation

## 2022-06-19 DIAGNOSIS — R059 Cough, unspecified: Secondary | ICD-10-CM | POA: Insufficient documentation

## 2022-06-19 DIAGNOSIS — Z23 Encounter for immunization: Secondary | ICD-10-CM | POA: Insufficient documentation

## 2022-06-19 DIAGNOSIS — Z95828 Presence of other vascular implants and grafts: Secondary | ICD-10-CM

## 2022-06-19 LAB — COMPREHENSIVE METABOLIC PANEL
ALT: 17 U/L (ref 0–44)
AST: 18 U/L (ref 15–41)
Albumin: 4.1 g/dL (ref 3.5–5.0)
Alkaline Phosphatase: 79 U/L (ref 38–126)
Anion gap: 7 (ref 5–15)
BUN: 10 mg/dL (ref 8–23)
CO2: 28 mmol/L (ref 22–32)
Calcium: 9.1 mg/dL (ref 8.9–10.3)
Chloride: 104 mmol/L (ref 98–111)
Creatinine, Ser: 0.86 mg/dL (ref 0.61–1.24)
GFR, Estimated: >60 mL/min (ref >60)
Glucose, Bld: 76 mg/dL (ref 70–99)
Potassium: 3.8 mmol/L (ref 3.5–5.1)
Sodium: 139 mmol/L (ref 135–145)
Total Bilirubin: 0.6 mg/dL (ref 0.3–1.2)
Total Protein: 7.4 g/dL (ref 6.5–8.1)

## 2022-06-19 LAB — TSH: TSH: 1.83 u[IU]/mL (ref 0.350–4.500)

## 2022-06-19 LAB — CBC WITH DIFFERENTIAL/PLATELET
Abs Immature Granulocytes: 0 10*3/uL (ref 0.00–0.07)
Basophils Absolute: 0 10*3/uL (ref 0.0–0.1)
Basophils Relative: 1 %
Eosinophils Absolute: 0.1 10*3/uL (ref 0.0–0.5)
Eosinophils Relative: 2 %
HCT: 39.1 % (ref 39.0–52.0)
Hemoglobin: 12.9 g/dL — ABNORMAL LOW (ref 13.0–17.0)
Immature Granulocytes: 0 %
Lymphocytes Relative: 20 %
Lymphs Abs: 0.8 10*3/uL (ref 0.7–4.0)
MCH: 33.1 pg (ref 26.0–34.0)
MCHC: 33 g/dL (ref 30.0–36.0)
MCV: 100.3 fL — ABNORMAL HIGH (ref 80.0–100.0)
Monocytes Absolute: 0.8 10*3/uL (ref 0.1–1.0)
Monocytes Relative: 20 %
Neutro Abs: 2.2 10*3/uL (ref 1.7–7.7)
Neutrophils Relative %: 57 %
Platelets: 180 10*3/uL (ref 150–400)
RBC: 3.9 MIL/uL — ABNORMAL LOW (ref 4.22–5.81)
RDW: 14.5 % (ref 11.5–15.5)
WBC: 3.9 10*3/uL — ABNORMAL LOW (ref 4.0–10.5)
nRBC: 0 % (ref 0.0–0.2)

## 2022-06-19 MED ORDER — HEPARIN SOD (PORK) LOCK FLUSH 100 UNIT/ML IV SOLN
500.0000 [IU] | Freq: Once | INTRAVENOUS | Status: AC
  Administered 2022-06-19: 500 [IU] via INTRAVENOUS
  Filled 2022-06-19: qty 5

## 2022-06-19 MED ORDER — SODIUM CHLORIDE 0.9% FLUSH
10.0000 mL | Freq: Once | INTRAVENOUS | Status: AC
  Administered 2022-06-19: 10 mL via INTRAVENOUS
  Filled 2022-06-19: qty 10

## 2022-06-19 NOTE — Progress Notes (Signed)
[facility] NOTE  Patient Care Team: [place] as PCP - General Telford Nab, RN as Oncology Nurse Navigator Cammie Sickle, MD as Consulting Physician (Oncology)  CHIEF COMPLAINTS/PURPOSE OF CONSULTATION: lung cancer  #  Oncology History Overview Note  IMPRESSION: 1. Thick-walled cavitary lesion of the posterior right upper lobe abutting the major fissure measuring 3.3 x 3.3 cm, highly concerning for primary lung malignancy or metastasis. 2. Although evaluation is limited by lack of intravenous contrast, suspect significant left hilar lymphadenopathy or mass, with apparent obstruction of the left upper lobe bronchus and severe narrowing of the left lower lobe bronchus. This finding is likewise highly concerning for primary lung malignancy or alternately nodal metastasis. Contrast enhanced CT may be helpful to more clearly evaluate the hilum. 3. There may be additional right hilar lymphadenopathy, again assessment limited by noncontrast CT. 4. Scattered heterogeneous and ground-glass airspace opacity throughout the upper lobes and right lower lobe, possibly reflecting postobstructive airspace disease in the left lung generally nonspecific. 5. Emphysema. 6. Coronary artery disease.   Aortic Atherosclerosis (ICD10-I70.0) and Emphysema (ICD10-J43.9).     Electronically Signed   By: Delanna Ahmadi M.D.   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 [Dr.Fleming/Dr.Aleskerov]  # LEFT HILAR- June 6th, 2023-  chemo-RT- plan SBRT of RUL nodule  # JUNE 14th-carbo-Taxol; 6/20 Taxol discontinued [acute infusion reaction]; 6/27-carbo Abraxane weekly  # CAD; COPD [Dr.Fleming- O2 none now]; DM; HTN   Malignant neoplasm of upper lobe of right lung ([place])  02/01/2022 Initial Diagnosis   Malignant neoplasm of upper lobe of right lung ([name])   02/01/2022 Cancer Staging   Staging form: Lung, AJCC 8th Edition -  Clinical: Stage IIIB (cT2a, cN3, cM0) - Signed by Cammie Sickle, MD on 02/01/2022   02/19/2022 - 04/09/2022 Chemotherapy   Patient is on Treatment Plan : LUNG Carboplatin / Paclitaxel + XRT q7d     06/26/2022 -  Chemotherapy   Patient is on Treatment Plan : LUNG Durvalumab (10) q14d        HISTORY OF PRESENTING ILLNESS: Alone.  Ambulating independently.  Louis Sullivan 71 y.o.  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  Patient finished concurrent chemoradiation end of July 2023.  Patient also status post SBRT to the right lung nodule.   In the interim patient was evaluated by radiation oncology.   Patient denies any worsening shortness of breath or cough. Patient has chronic mild shortness of breath.  Chronic mild cough not any worse.  He has been using his inhalers ProAir.  Denies any headaches or vision changes.  Review of Systems  Constitutional:  Negative for chills, diaphoresis, fever, malaise/fatigue and weight loss.  HENT:  Negative for nosebleeds and sore throat.   Eyes:  Negative for double vision.  Respiratory:  Positive for cough and shortness of breath. Negative for hemoptysis, sputum production and wheezing.   Cardiovascular:  Negative for chest pain, palpitations, orthopnea and leg swelling.  Gastrointestinal:  Negative for abdominal pain, blood in stool, constipation, diarrhea, heartburn, melena, nausea and vomiting.  Genitourinary:  Negative for dysuria, frequency and urgency.  Musculoskeletal:  Positive for joint pain. Negative for back pain.  Skin: Negative.  Negative for itching and rash.  Neurological:  Negative for dizziness, tingling, focal weakness, weakness and headaches.  Endo/Heme/Allergies:  Does not  bruise/bleed easily.  Psychiatric/Behavioral:  Negative for depression. The patient is not nervous/anxious and does  not have insomnia.      MEDICAL HISTORY:  Past Medical History:  Diagnosis Date   COPD (chronic obstructive pulmonary disease) ([place])    Coronary artery disease    1 stent placed in about 2008   Diabetes mellitus without complication (HCC)    Dyspnea    Headache    migraines   Hyperlipidemia    Hypertension    Pneumonia 12/2021   hospitalized at [facility]: Past Surgical History:  Procedure Laterality Date   CARDIAC CATHETERIZATION     COLONOSCOPY     IR IMAGING GUIDED PORT INSERTION  02/12/2022   VIDEO BRONCHOSCOPY WITH ENDOBRONCHIAL NAVIGATION N/A 01/23/2022   Procedure: VIDEO BRONCHOSCOPY WITH ENDOBRONCHIAL NAVIGATION;  Surgeon: Ottie Glazier, MD;  Location: [facility] ORS;  Service: Thoracic;  Laterality: N/A;   VIDEO BRONCHOSCOPY WITH ENDOBRONCHIAL ULTRASOUND N/A 01/23/2022   Procedure: VIDEO BRONCHOSCOPY WITH ENDOBRONCHIAL ULTRASOUND;  Surgeon: Ottie Glazier, MD;  Location: [facility] ORS;  Service: Thoracic;  Laterality: N/A;    SOCIAL HISTORY: Social History   Socioeconomic History   Marital status: Widowed    Spouse name: Not on file   Number of children: Not on file   Years of education: Not on file   Highest education level: Not on file  Occupational History   Not on file  Tobacco Use   Smoking status: Former    Packs/day: 1.00    Years: 20.00    Total pack years: 20.00    Types: Cigarettes    Quit date: 02/11/2022    Years since quitting: 0.3   Smokeless tobacco: Never   Tobacco comments:    Request to MD for Nicotine patch to help him quit smoking.  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 Lives in [place]. Son passed in 2021;MI wife passed away in 1990s/aneurysm; daughter in [place].    Social Determinants of Health    Financial Resource Strain: Not on file  Food Insecurity: Not on file  Transportation Needs: Not on file  Physical Activity: Not on file  Stress: Not on file  Social Connections: Not on file  Intimate Partner Violence: Not on file    FAMILY HISTORY: Family History  Problem Relation Age of Onset   Throat cancer Brother     ALLERGIES:  is allergic to paclitaxel.  MEDICATIONS:  Current Outpatient Medications  Medication Sig Dispense Refill   albuterol (PROVENTIL) (5 MG/ML) 0.5% nebulizer solution Take 2.5 mg by nebulization every morning.     albuterol (VENTOLIN HFA) 108 (90 Base) MCG/ACT inhaler Inhale 2 puffs into the lungs every 4 (four) hours as needed for wheezing or shortness of breath.     aspirin EC 81 MG tablet Take 81 mg by mouth daily.     blood glucose meter kit and supplies KIT Dispense based on patient and insurance preference. Use up to four times daily as directed. 1 each 0   Cholecalciferol (VITAMIN D3 PO) Take 1 tablet by mouth daily at 6 (six) AM.     hydrocortisone 2.5 % ointment Apply topically 2 (two) times daily. Apply to skin rash twice a day as needed for  itch. 30 g 0   lidocaine-prilocaine (EMLA) cream Apply on the port. 30 -45 min  prior to port access. 30 g 3   losartan (COZAAR) 100 MG tablet Take 100 mg by mouth every evening.     metFORMIN (GLUCOPHAGE) 1000 MG tablet Take 1,000 mg by mouth daily with breakfast.     metoprolol succinate (TOPROL XL) 25 MG 24 hr tablet Take 1 tablet (25 mg total) by mouth daily. 30 tablet 3   OXYGEN Inhale 2 L into the lungs at bedtime.     traZODone (DESYREL) 50 MG tablet Take 50 mg by mouth at bedtime.     atorvastatin (LIPITOR) 80 MG tablet Take 1 tablet (80 mg total) by mouth every morning. (Patient not taking: Reported on 06/19/2022) 90 tablet 1   Nicotine 21-14-7 MG/24HR KIT Place 1 patch onto the skin daily. (Patient not taking: Reported on 06/19/2022) 60 kit 1   ondansetron (ZOFRAN) 8 MG tablet One pill every 8 hours  as needed for nausea/vomitting. (Patient not taking: Reported on 06/19/2022) 40 tablet 1   prochlorperazine (COMPAZINE) 10 MG tablet TAKE 1 TABLET(10 MG) BY MOUTH EVERY 6 HOURS AS NEEDED FOR NAUSEA OR VOMITING (Patient not taking: Reported on 06/19/2022) 40 tablet 1   No current facility-administered medications for this visit.      Marland Kitchen  PHYSICAL EXAMINATION: ECOG PERFORMANCE STATUS: 1 - Symptomatic but completely ambulatory  Vitals:   06/19/22 1331  BP: (!) 146/81  Pulse: 67  Resp: 16  Temp: (!) 96.8 F (36 C)  SpO2: 94%     Filed Weights   06/19/22 1331  Weight: 221 lb (100.2 kg)      Physical Exam Vitals and nursing note reviewed.  HENT:     Head: Normocephalic and atraumatic.     Mouth/Throat:     Pharynx: Oropharynx is clear.  Eyes:     Extraocular Movements: Extraocular movements intact.     Pupils: Pupils are equal, round, and reactive to light.  Cardiovascular:     Rate and Rhythm: Normal rate and regular rhythm.  Pulmonary:     Comments: Decreased breath sounds bilaterally.  Abdominal:     Palpations: Abdomen is soft.  Musculoskeletal:        General: Normal range of motion.     Cervical back: Normal range of motion.  Skin:    General: Skin is warm.  Neurological:     General: No focal deficit present.     Mental Status: He is alert and oriented to person, place, and time.  Psychiatric:        Behavior: Behavior normal.        Judgment: Judgment normal.      LABORATORY DATA:  I have reviewed the data as listed Lab Results  Component Value Date   WBC 3.9 (L) 06/19/2022   HGB 12.9 (L) 06/19/2022   HCT 39.1 06/19/2022   MCV 100.3 (H) 06/19/2022   PLT 180 06/19/2022   Recent Labs    04/09/22 1041 04/25/22 0944 06/19/22 1309  NA 135 140 139  K 4.1 3.7 3.8  CL 108 106 104  CO2 _0 GLUCOSE 101* 105* 76  BUN _1 CREATININE 0.93 0.99 0.86  CALCIUM 8.4* 8.9 9.1  GFRNONAA >60 >60 >60  PROT 6.9 6.7 7.4  ALBUMIN 3.7 3.8 4.1  AST  _2 ALT _3 ALKPHOS 61 64 79  BILITOT 0.7 0.4 0.6  RADIOGRAPHIC STUDIES: I have personally reviewed the radiological images as listed and agreed with the findings in the report. CT CHEST W CONTRAST  Result Date: 06/21/2022 CLINICAL DATA:  Non-small-cell lung cancer. Restaging. * Tracking Code: BO * EXAM: CT CHEST WITH CONTRAST TECHNIQUE: Multidetector CT imaging of the chest was performed during intravenous contrast administration. RADIATION DOSE REDUCTION: This exam was performed according to the departmental dose-optimization program which includes automated exposure control, adjustment of the mA and/or kV according to patient size and/or use of iterative reconstruction technique. CONTRAST:  51m OMNIPAQUE IOHEXOL 300 MG/ML  SOLN COMPARISON:  PET-CT 02/08/2022.  Chest CT 01/21/2022. FINDINGS: Cardiovascular: The heart size is normal. No substantial pericardial effusion. Coronary artery calcification is evident. Moderate atherosclerotic calcification is noted in the wall of the thoracic aorta. Right Port-A-Cath tip is positioned at the SVC/RA junction. Mediastinum/Nodes: 13 mm subcarinal short axis lymph node on 74/2 is stable. Soft tissue fullness noted in the left hilum with patient noted to have left hilar hypermetabolism on previous PET-CT. No discrete left hilar mass on the current study. No right hilar lymphadenopathy. The esophagus has normal imaging features. There is no axillary lymphadenopathy. Lungs/Pleura: Extensive changes of centrilobular and paraseptal emphysema. Cavitary posterior right upper lobe pulmonary lesion measured previously at 3.3 x 3.3 cm is now 1.9 x 1.9 cm on image 41/3. A new ill-defined irregular 10 x 6 mm nodule is seen in the anterior right upper lobe on 54/3. Otherwise no new suspicious pulmonary nodule or mass. No focal airspace consolidation. There is no evidence of pleural effusion. Upper Abdomen: Unremarkable. Musculoskeletal: No worrisome lytic or  sclerotic osseous abnormality. Small sclerotic lesion in the left seventh lateral rib is stable, likely bone island. IMPRESSION: 1. Interval decrease in size of the cavitary posterior right upper lobe pulmonary lesion. 2. New ill-defined irregular 10 x 6 mm anterior right upper lobe pulmonary nodule. This may be infectious/inflammatory. Close attention on follow-up recommended. 3. Similar appearance of soft tissue fullness in the left hilum with patient noted to have left hilar hypermetabolism on previous PET-CT. No discrete left hilar mass on the current study. 4. Stable 13 mm subcarinal lymph node. 5. The peribronchovascular nodularity seen on the 01/21/2022 exam in the left upper lobe in both lower lobes has largely resolved in the interval suggesting infectious/inflammatory etiology. 6. Aortic Atherosclerosis (ICD10-I70.0) and Emphysema (ICD10-J43.9). Electronically Signed   By: EMisty StanleyM.D.   On: 06/21/2022 07:52   LONG TERM MONITOR (3-14 DAYS)  Result Date: 05/30/2022 Patch Wear Time:  2 days and 9 hours (2023-09-01T00:10:15-0400 to 2023-09-03T09:55:31-0400) Patient had a min HR of 55 bpm, max HR of 167 bpm, and avg HR of 83 bpm. Predominant underlying rhythm was Sinus Rhythm. Bundle Branch Block/IVCD was present. 2 Ventricular Tachycardia runs occurred, the run with the fastest interval lasting 5 beats with  a max rate of 167 bpm, the longest lasting 6 beats with an avg rate of 121 bpm. Isolated SVEs were occasional (2.9%, 8106), SVE Couplets were rare (<1.0%, 103), and SVE Triplets were rare (<1.0%, 5). Isolated VEs were frequent (5.1%, 14483), VE Couplets  were rare (<1.0%, 25), and VE Triplets were rare (<1.0%, 1). Ventricular Bigeminy and Trigeminy were present. Conclusion 5 beat run of VT noted Frequent PVCs 5.1% burden noted    ASSESSMENT & PLAN:   Malignant neoplasm of upper lobe of right lung (HCC) # Clinical stage III vs Stage IV [left hilar malignancy with contralateral right lung  nodule] -right upper lobe lung  cancer non-small cell favor-squamous cell; Left Hilar- Bx- squamous cell.  PET MAY 09BD- Hypermetabolic cavitary mass of the right upper lobe; Hypermetabolic left hilar mass versus conglomerate hilar lymphadenopathy, possibly due to nodal metastatic disease or primary lung malignancy. NGS-F-ONE- PDL-1 0%; no targets**.  Currently status post with Carbo- Abraxane with RT [facility] 26th - 07/26; Taxol:allergy] weekly #6 chemotherapy [uly 25 th]. Right upper lobe mass -also SBRT [08/07- 8-21].    #  We will plan reimaging with CT scan posttreatment; CT scan ordered-patient will proceed with immunotherapy. In 1 week.   # Cough- dry cough- ? RT- recommend OTC robitussin prn.    # Constipation- sec to chemo- recommend Miralax once or twice as needed. STABLE.   # Smoking: Active smoker; quit smoking recently. OFF  nicotine patches. STABLE.   # COPD: on proair- continue inhalers as per pulmonary.STABLE.   # IV ACCESS: port-functional.    # DISPOSITION: # Add TSH to labs today #  CT SCAN ASAP-  # follow up in 1 week- MD; no labs-;port-  Durvalumab [new]-Dr.B           All questions were answered. The patient knows to call the clinic with any problems, questions or concerns.       Cammie Sickle, MD 06/21/2022 12:40 PM

## 2022-06-19 NOTE — Progress Notes (Signed)
Pt in for follow up, reports has had new cough which started 3 weeks ago. Pt also reports having a "warm sensation that runs down side of face on left side".

## 2022-06-19 NOTE — Assessment & Plan Note (Addendum)
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  PET MAY 64BR- Hypermetabolic cavitary mass of the right upper lobe; Hypermetabolic left hilar mass versus conglomerate hilar lymphadenopathy, possibly due to nodal metastatic disease or primary lung malignancy. NGS-F-ONE- PDL-1 0%; no targets**.  Currently status post with Carbo- Abraxane with RT [facility] 26th - 07/26; Taxol:allergy] weekly #6 chemotherapy [uly 25 th]. Right upper lobe mass -also SBRT [08/07- 8-21].    #  We will plan reimaging with CT scan posttreatment; CT scan ordered-patient will proceed with immunotherapy. In 1 week.   # Cough- dry cough- ? RT- recommend OTC robitussin prn.    # Constipation- sec to chemo- recommend Miralax once or twice as needed. STABLE.   # Smoking: Active smoker; quit smoking recently. OFF  nicotine patches. STABLE.   # COPD: on proair- continue inhalers as per pulmonary.STABLE.   # IV ACCESS: port-functional.    # DISPOSITION: # Add TSH to labs today #  CT SCAN ASAP-  # follow up in 1 week- MD; no labs-;port-  Durvalumab [new]-Dr.B

## 2022-06-19 NOTE — Progress Notes (Signed)
DISCONTINUE ON PATHWAY REGIMEN - Non-Small Cell Lung     Administer weekly:     Paclitaxel      Carboplatin   **Always confirm dose/schedule in your pharmacy ordering system**  REASON: Continuation Of Treatment PRIOR TREATMENT: [id number]: Carboplatin AUC=2 + Paclitaxel 45 mg/m2 Weekly During Radiation TREATMENT RESPONSE: Stable Disease (SD)  START ON PATHWAY REGIMEN - Non-Small Cell Lung     A cycle is every 14 days:     Durvalumab   **Always confirm dose/schedule in your pharmacy ordering system**  Patient Characteristics: Preoperative or Nonsurgical Candidate (Clinical Staging), Stage III - Nonsurgical Candidate (Nonsquamous and Squamous), PS = 0, 1 Therapeutic Status: Preoperative or Nonsurgical Candidate (Clinical Staging) AJCC T Category: cT2a AJCC N Category: cN3 AJCC M Category: cM0 AJCC 8 Stage Grouping: IIIB ECOG Performance Status: 1 Intent of Therapy: Curative Intent, Discussed with Patient

## 2022-06-20 ENCOUNTER — Ambulatory Visit
Admission: RE | Admit: 2022-06-20 | Discharge: 2022-06-20 | Disposition: A | Discharge status: Home / Self Care | Payer: Medicare Other | Source: Ambulatory Visit | Attending: Internal Medicine | Admitting: Internal Medicine

## 2022-06-20 ENCOUNTER — Inpatient Hospital Stay: Payer: Medicare Other

## 2022-06-20 DIAGNOSIS — C3411 Malignant neoplasm of upper lobe, right bronchus or lung: Secondary | ICD-10-CM | POA: Insufficient documentation

## 2022-06-20 MED ORDER — IOHEXOL 300 MG/ML  SOLN
75.0000 mL | Freq: Once | INTRAMUSCULAR | Status: AC | PRN
  Administered 2022-06-20: 75 mL via INTRAVENOUS

## 2022-06-21 ENCOUNTER — Encounter: Payer: Self-pay | Admitting: Internal Medicine

## 2022-06-26 ENCOUNTER — Inpatient Hospital Stay: Payer: Medicare Other

## 2022-06-26 ENCOUNTER — Encounter: Payer: Self-pay | Admitting: Internal Medicine

## 2022-06-26 ENCOUNTER — Inpatient Hospital Stay (HOSPITAL_BASED_OUTPATIENT_CLINIC_OR_DEPARTMENT_OTHER): Payer: Medicare Other | Admitting: Internal Medicine

## 2022-06-26 VITALS — BP 138/79 | HR 80 | Temp 97.6°F | Resp 16 | Wt 218.0 lb

## 2022-06-26 DIAGNOSIS — Z5112 Encounter for antineoplastic immunotherapy: Secondary | ICD-10-CM | POA: Diagnosis not present

## 2022-06-26 DIAGNOSIS — C3411 Malignant neoplasm of upper lobe, right bronchus or lung: Secondary | ICD-10-CM

## 2022-06-26 MED ORDER — SODIUM CHLORIDE 0.9% FLUSH
10.0000 mL | INTRAVENOUS | Status: DC | PRN
  Filled 2022-06-26: qty 10

## 2022-06-26 MED ORDER — SODIUM CHLORIDE 0.9 % IV SOLN
Freq: Once | INTRAVENOUS | Status: AC
  Filled 2022-06-26: qty 250

## 2022-06-26 MED ORDER — HEPARIN SOD (PORK) LOCK FLUSH 100 UNIT/ML IV SOLN
500.0000 [IU] | Freq: Once | INTRAVENOUS | Status: AC | PRN
  Administered 2022-06-26: 500 [IU]
  Filled 2022-06-26: qty 5

## 2022-06-26 MED ORDER — SODIUM CHLORIDE 0.9 % IV SOLN
10.0000 mg/kg | Freq: Once | INTRAVENOUS | Status: AC
  Administered 2022-06-26: 1000 mg via INTRAVENOUS
  Filled 2022-06-26: qty 20

## 2022-06-26 NOTE — Progress Notes (Signed)
Pt reports sister passed away unexpectedly last week.  Pt weight is down 3 lbs from last week but reports "I'm trying to cut back". Pt has some mild shoulder pain bilaterally.

## 2022-06-26 NOTE — Patient Instructions (Addendum)
[facility] CANCER CTR AT [place]  Discharge Instructions: Thank you for choosing [facility] to provide your oncology and hematology care.  If you have a lab appointment with the [facility], please go directly to the [facility] and check in at the registration area.  Wear comfortable clothing and clothing appropriate for easy access to any Portacath or PICC line.   We strive to give you quality time with your provider. You may need to reschedule your appointment if you arrive late (15 or more minutes).  Arriving late affects you and other patients whose appointments are after yours.  Also, if you miss three or more appointments without notifying the office, you may be dismissed from the clinic at the provider's discretion.      For prescription refill requests, have your pharmacy contact our office and allow 72 hours for refills to be completed.    Today you received the following chemotherapy and/or immunotherapy agents - Durvalumab      To help prevent nausea and vomiting after your treatment, we encourage you to take your nausea medication as directed.  BELOW ARE SYMPTOMS THAT SHOULD BE REPORTED IMMEDIATELY: *FEVER GREATER THAN 100.4 F (38 C) OR HIGHER *CHILLS OR SWEATING *NAUSEA AND VOMITING THAT IS NOT CONTROLLED WITH YOUR NAUSEA MEDICATION *UNUSUAL SHORTNESS OF BREATH *UNUSUAL BRUISING OR BLEEDING *URINARY PROBLEMS (pain or burning when urinating, or frequent urination) *BOWEL PROBLEMS (unusual diarrhea, constipation, pain near the anus) TENDERNESS IN MOUTH AND THROAT WITH OR WITHOUT PRESENCE OF ULCERS (sore throat, sores in mouth, or a toothache) UNUSUAL RASH, SWELLING OR PAIN  UNUSUAL VAGINAL DISCHARGE OR ITCHING   Items with * indicate a potential emergency and should be followed up as soon as possible or go to the Emergency Department if any problems should occur.  Please show the CHEMOTHERAPY ALERT CARD or IMMUNOTHERAPY ALERT CARD at check-in  to the Emergency Department and triage nurse.  Should you have questions after your visit or need to cancel or reschedule your appointment, please contact [facility] CANCER [place] AT [place]  [phone] and follow the prompts.  Office hours are 8:00 a.m. to 4:30 p.m. Monday - Friday. Please note that voicemails left after 4:00 p.m. may not be returned until the following business day.  We are closed weekends and major holidays. You have access to a nurse at all times for urgent questions. Please call the main number to the clinic [phone] and follow the prompts.  For any non-urgent questions, you may also contact your provider using MyChart. We now offer e-Visits for anyone 65 and older to request care online for non-urgent symptoms. For details visit [email or web address].   Also download the MyChart app! Go to the app store, search "MyChart", open the app, select [facility], and log in with your MyChart username and password.  Masks are optional in the cancer centers. If you would like for your care team to wear a mask while they are taking care of you, please let them know. For doctor visits, patients may have with them one support person who is at least 71 years old. At this time, visitors are not allowed in the infusion area.  Durvalumab Injection What is this medication? DURVALUMAB (dur VAL ue mab) treats some types of cancer. It works by helping your immune system slow or stop the spread of cancer cells. It is a monoclonal antibody. This medicine may be used for other purposes; ask your health care provider or pharmacist if you have questions.  COMMON BRAND NAME(S): IMFINZI What should I tell my care team before I take this medication? They need to know if you have any of these conditions: Allogeneic stem cell transplant (uses someone else's stem cells) Autoimmune diseases, such as Crohn disease, ulcerative colitis, lupus History of chest radiation Nervous system problems,  such as Guillain-Barre syndrome, myasthenia gravis Organ transplant An unusual or allergic reaction to durvalumab, other medications, foods, dyes, or preservatives Pregnant or trying to get pregnant Breast-feeding How should I use this medication? This medication is infused into a vein. It is given by your care team in a hospital or clinic setting. A special MedGuide will be given to you before each treatment. Be sure to read this information carefully each time. Talk to your care team about the use of this medication in children. Special care may be needed. Overdosage: If you think you have taken too much of this medicine contact a poison control center or emergency room at once. NOTE: This medicine is only for you. Do not share this medicine with others. What if I miss a dose? Keep appointments for follow-up doses. It is important not to miss your dose. Call your care team if you are unable to keep an appointment. What may interact with this medication? Interactions have not been studied. This list may not describe all possible interactions. Give your health care provider a list of all the medicines, herbs, non-prescription drugs, or dietary supplements you use. Also tell them if you smoke, drink alcohol, or use illegal drugs. Some items may interact with your medicine. What should I watch for while using this medication? Your condition will be monitored carefully while you are receiving this medication. You may need blood work while taking this medication. This medication may cause serious skin reactions. They can happen weeks to months after starting the medication. Contact your care team right away if you notice fevers or flu-like symptoms with a rash. The rash may be red or purple and then turn into blisters or peeling of the skin. You may also notice a red rash with swelling of the face, lips, or lymph nodes in your neck or under your arms. Tell your care team right away if you have any  change in your eyesight. Talk to your care team if you may be pregnant. Serious birth defects can occur if you take this medication during pregnancy and for 3 months after the last dose. You will need a negative pregnancy test before starting this medication. Contraception is recommended while taking this medication and for 3 months after the last dose. Your care team can help you find the option that works for you. Do not breastfeed while taking this medication and for 3 months after the last dose. What side effects may I notice from receiving this medication? Side effects that you should report to your care team as soon as possible: Allergic reactions--skin rash, itching, hives, swelling of the face, lips, tongue, or throat Dry cough, shortness of breath or trouble breathing Eye pain, redness, irritation, or discharge with blurry or decreased vision Heart muscle inflammation--unusual weakness or fatigue, shortness of breath, chest pain, fast or irregular heartbeat, dizziness, swelling of the ankles, feet, or hands Hormone gland problems--headache, sensitivity to light, unusual weakness or fatigue, dizziness, fast or irregular heartbeat, increased sensitivity to cold or heat, excessive sweating, constipation, hair loss, increased thirst or amount of urine, tremors or shaking, irritability Infusion reactions--chest pain, shortness of breath or trouble breathing, feeling faint or lightheaded Kidney  injury (glomerulonephritis)--decrease in the amount of urine, red or dark brown urine, foamy or bubbly urine, swelling of the ankles, hands, or feet Liver injury--right upper belly pain, loss of appetite, nausea, light-colored stool, dark yellow or brown urine, yellowing skin or eyes, unusual weakness or fatigue Pain, tingling, or numbness in the hands or feet, muscle weakness, change in vision, confusion or trouble speaking, loss of balance or coordination, trouble walking, seizures Rash, fever, and swollen  lymph nodes Redness, blistering, peeling, or loosening of the skin, including inside the mouth Sudden or severe stomach pain, bloody diarrhea, fever, nausea, vomiting Side effects that usually do not require medical attention (report these to your care team if they continue or are bothersome): Bone, joint, or muscle pain Diarrhea Fatigue Loss of appetite Nausea Skin rash This list may not describe all possible side effects. Call your doctor for medical advice about side effects. You may report side effects to FDA at 1-800-FDA-1088. Where should I keep my medication? This medication is given in a hospital or clinic. It will not be stored at home. NOTE: This sheet is a summary. It may not cover all possible information. If you have questions about this medicine, talk to your doctor, pharmacist, or health care provider.  2023 Elsevier/Gold Standard (2021-02-21 00:00:00)

## 2022-06-26 NOTE — Progress Notes (Signed)
[facility] NOTE  Patient Care Team: [place] as PCP - General Telford Nab, RN as Oncology Nurse Navigator Cammie Sickle, MD as Consulting Physician (Oncology)  CHIEF COMPLAINTS/PURPOSE OF CONSULTATION: lung cancer  #  Oncology History Overview Note  IMPRESSION: 1. Thick-walled cavitary lesion of the posterior right upper lobe abutting the major fissure measuring 3.3 x 3.3 cm, highly concerning for primary lung malignancy or metastasis. 2. Although evaluation is limited by lack of intravenous contrast, suspect significant left hilar lymphadenopathy or mass, with apparent obstruction of the left upper lobe bronchus and severe narrowing of the left lower lobe bronchus. This finding is likewise highly concerning for primary lung malignancy or alternately nodal metastasis. Contrast enhanced CT may be helpful to more clearly evaluate the hilum. 3. There may be additional right hilar lymphadenopathy, again assessment limited by noncontrast CT. 4. Scattered heterogeneous and ground-glass airspace opacity throughout the upper lobes and right lower lobe, possibly reflecting postobstructive airspace disease in the left lung generally nonspecific. 5. Emphysema. 6. Coronary artery disease.   Aortic Atherosclerosis (ICD10-I70.0) and Emphysema (ICD10-J43.9).     Electronically Signed   By: Delanna Ahmadi M.D.   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  Patient also status post SBRT to the right lung nodule.   # CAD; COPD [Dr.Fleming- O2 none now]; DM; HTN   Malignant neoplasm of upper lobe of right lung ([place])  02/01/2022 Initial Diagnosis    Malignant neoplasm of upper lobe of right lung ([place])   02/01/2022 Cancer Staging   Staging form: Lung, AJCC 8th Edition - Clinical: Stage IIIB (cT2a, cN3, cM0) - Signed by Cammie Sickle, MD on 02/01/2022   02/19/2022 - 04/09/2022 Chemotherapy   Patient is on Treatment Plan : LUNG Carboplatin / Paclitaxel + XRT q7d     06/26/2022 -  Chemotherapy   Patient is on Treatment Plan : LUNG Durvalumab (10) q14d        HISTORY OF PRESENTING ILLNESS: Alone.  Ambulating independently.  Louis Sullivan 71 y.o.  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  Patient finished concurrent chemoradiation end of July 2023.  Patient also status post SBRT to the right lung nodule.   Pt reports sister passed away unexpectedly last week.  Pt weight is down 3 lbs from last week but reports "I'm trying to cut back". Pt has some mild shoulder pain bilaterally-however the pain is not new.  Complains of mild worsening shortness of breath mild worse cough.  No hemoptysis.  Denies any headaches or vision changes. Review of Systems  Constitutional:  Negative for chills, diaphoresis, fever, malaise/fatigue and weight loss.  HENT:  Negative for nosebleeds and sore throat.   Eyes:  Negative for double vision.  Respiratory:  Positive for cough and shortness of breath. Negative for hemoptysis, sputum production and wheezing.   Cardiovascular:  Negative for chest pain, palpitations, orthopnea and leg swelling.  Gastrointestinal:  Negative for abdominal pain, blood in stool, constipation, diarrhea, heartburn, melena, nausea and vomiting.  Genitourinary:  Negative for dysuria, frequency  and urgency.  Musculoskeletal:  Positive for joint pain. Negative for back pain.  Skin: Negative.  Negative for itching and rash.   Neurological:  Negative for dizziness, tingling, focal weakness, weakness and headaches.  Endo/Heme/Allergies:  Does not bruise/bleed easily.  Psychiatric/Behavioral:  Negative for depression. The patient is not nervous/anxious and does not have insomnia.      MEDICAL HISTORY:  Past Medical History:  Diagnosis Date   COPD (chronic obstructive pulmonary disease) ([place])    Coronary artery disease    1 stent placed in about 2008   Diabetes mellitus without complication (HCC)    Dyspnea    Headache    migraines   Hyperlipidemia    Hypertension    Pneumonia 12/2021   hospitalized at [facility]: Past Surgical History:  Procedure Laterality Date   CARDIAC CATHETERIZATION     COLONOSCOPY     IR IMAGING GUIDED PORT INSERTION  02/12/2022   VIDEO BRONCHOSCOPY WITH ENDOBRONCHIAL NAVIGATION N/A 01/23/2022   Procedure: VIDEO BRONCHOSCOPY WITH ENDOBRONCHIAL NAVIGATION;  Surgeon: Ottie Glazier, MD;  Location: [facility] ORS;  Service: Thoracic;  Laterality: N/A;   VIDEO BRONCHOSCOPY WITH ENDOBRONCHIAL ULTRASOUND N/A 01/23/2022   Procedure: VIDEO BRONCHOSCOPY WITH ENDOBRONCHIAL ULTRASOUND;  Surgeon: Ottie Glazier, MD;  Location: [facility] ORS;  Service: Thoracic;  Laterality: N/A;    SOCIAL HISTORY: Social History   Socioeconomic History   Marital status: Widowed    Spouse name: Not on file   Number of children: Not on file   Years of education: Not on file   Highest education level: Not on file  Occupational History   Not on file  Tobacco Use   Smoking status: Former    Packs/day: 1.00    Years: 20.00    Total pack years: 20.00    Types: Cigarettes    Quit date: 02/11/2022    Years since quitting: 0.3   Smokeless tobacco: Never   Tobacco comments:    Request to MD for Nicotine patch to help him quit smoking.  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 Lives in [place] self. Son passed in 2021;MI wife passed away in 1990s/aneurysm; daughter in [place].    Social Determinants of Health   Financial Resource Strain: Not on file  Food Insecurity: Not on file  Transportation Needs: Not on file  Physical Activity: Not on file  Stress: Not on file  Social Connections: Not on file  Intimate Partner Violence: Not on file    FAMILY HISTORY: Family History  Problem Relation Age of Onset   Throat cancer Brother     ALLERGIES:  is allergic to paclitaxel.  MEDICATIONS:  Current Outpatient Medications  Medication Sig Dispense Refill   albuterol (PROVENTIL) (5 MG/ML) 0.5% nebulizer solution Take 2.5 mg by nebulization every morning.     albuterol (VENTOLIN HFA) 108 (90 Base) MCG/ACT inhaler Inhale 2 puffs into the lungs every 4 (four) hours as needed for wheezing or shortness of breath.     aspirin EC 81 MG tablet Take 81 mg by mouth daily.     atorvastatin (LIPITOR) 80 MG tablet Take 1 tablet (80 mg total) by mouth every morning. 90 tablet 1   blood glucose meter kit and supplies  KIT Dispense based on patient and insurance preference. Use up to four times daily as directed. 1 each 0   hydrocortisone 2.5 % ointment Apply topically 2 (two) times daily. Apply to skin rash twice a day as needed for itch. 30 g 0   lidocaine-prilocaine (EMLA) cream Apply on the port. 30 -45 min  prior to port access. 30 g 3   losartan (COZAAR) 100 MG tablet Take 100 mg by mouth every evening.     metFORMIN (GLUCOPHAGE) 1000 MG tablet Take 1,000 mg by mouth daily with breakfast.     metoprolol succinate (TOPROL XL) 25 MG 24 hr tablet Take 1 tablet (25 mg total) by mouth daily. 30 tablet 3   OXYGEN Inhale 2 L into the lungs at bedtime.     Cholecalciferol (VITAMIN D3 PO) Take 1 tablet by mouth daily at 6 (six) AM.     Nicotine 21-14-7 MG/24HR KIT Place 1 patch onto  the skin daily. (Patient not taking: Reported on 06/19/2022) 60 kit 1   ondansetron (ZOFRAN) 8 MG tablet One pill every 8 hours as needed for nausea/vomitting. (Patient not taking: Reported on 06/19/2022) 40 tablet 1   prochlorperazine (COMPAZINE) 10 MG tablet TAKE 1 TABLET(10 MG) BY MOUTH EVERY 6 HOURS AS NEEDED FOR NAUSEA OR VOMITING (Patient not taking: Reported on 06/19/2022) 40 tablet 1   traZODone (DESYREL) 50 MG tablet Take 50 mg by mouth at bedtime. (Patient not taking: Reported on 06/26/2022)     No current facility-administered medications for this visit.      Marland Kitchen  PHYSICAL EXAMINATION: ECOG PERFORMANCE STATUS: 1 - Symptomatic but completely ambulatory  Vitals:   06/26/22 0918  BP: 138/79  Pulse: 80  Resp: 16  Temp: 97.6 F (36.4 C)  SpO2: 94%     Filed Weights   06/26/22 0918  Weight: 218 lb (98.9 kg)      Physical Exam Vitals and nursing note reviewed.  HENT:     Head: Normocephalic and atraumatic.     Mouth/Throat:     Pharynx: Oropharynx is clear.  Eyes:     Extraocular Movements: Extraocular movements intact.     Pupils: Pupils are equal, round, and reactive to light.  Cardiovascular:     Rate and Rhythm: Normal rate and regular rhythm.  Pulmonary:     Comments: Decreased breath sounds bilaterally.  Abdominal:     Palpations: Abdomen is soft.  Musculoskeletal:        General: Normal range of motion.     Cervical back: Normal range of motion.  Skin:    General: Skin is warm.  Neurological:     General: No focal deficit present.     Mental Status: He is alert and oriented to person, place, and time.  Psychiatric:        Behavior: Behavior normal.        Judgment: Judgment normal.      LABORATORY DATA:  I have reviewed the data as listed Lab Results  Component Value Date   WBC 3.9 (L) 06/19/2022   HGB 12.9 (L) 06/19/2022   HCT 39.1 06/19/2022   MCV 100.3 (H) 06/19/2022   PLT 180 06/19/2022   Recent Labs    04/09/22 1041 04/25/22 0944  06/19/22 1309  NA 135 140 139  K 4.1 3.7 3.8  CL 108 106 104  CO2 _0 GLUCOSE 101* 105* 76  BUN _1 CREATININE 0.93 0.99 0.86  CALCIUM 8.4* 8.9 9.1  GFRNONAA >60 >60 >60  PROT 6.9 6.7 7.4  ALBUMIN 3.7 3.8 4.1  AST _0 ALT _1 ALKPHOS 61 64 79  BILITOT 0.7 0.4 0.6    RADIOGRAPHIC STUDIES: I have personally reviewed the radiological images as listed and agreed with the findings in the report. CT CHEST W CONTRAST  Result Date: 06/21/2022 CLINICAL DATA:  Non-small-cell lung cancer. Restaging. * Tracking Code: BO * EXAM: CT CHEST WITH CONTRAST TECHNIQUE: Multidetector CT imaging of the chest was performed during intravenous contrast administration. RADIATION DOSE REDUCTION: This exam was performed according to the departmental dose-optimization program which includes automated exposure control, adjustment of the mA and/or kV according to patient size and/or use of iterative reconstruction technique. CONTRAST:  73m OMNIPAQUE IOHEXOL 300 MG/ML  SOLN COMPARISON:  PET-CT 02/08/2022.  Chest CT 01/21/2022. FINDINGS: Cardiovascular: The heart size is normal. No substantial pericardial effusion. Coronary artery calcification is evident. Moderate atherosclerotic calcification is noted in the wall of the thoracic aorta. Right Port-A-Cath tip is positioned at the SVC/RA junction. Mediastinum/Nodes: 13 mm subcarinal short axis lymph node on 74/2 is stable. Soft tissue fullness noted in the left hilum with patient noted to have left hilar hypermetabolism on previous PET-CT. No discrete left hilar mass on the current study. No right hilar lymphadenopathy. The esophagus has normal imaging features. There is no axillary lymphadenopathy. Lungs/Pleura: Extensive changes of centrilobular and paraseptal emphysema. Cavitary posterior right upper lobe pulmonary lesion measured previously at 3.3 x 3.3 cm is now 1.9 x 1.9 cm on image 41/3. A new ill-defined irregular 10 x 6 mm nodule is seen in the  anterior right upper lobe on 54/3. Otherwise no new suspicious pulmonary nodule or mass. No focal airspace consolidation. There is no evidence of pleural effusion. Upper Abdomen: Unremarkable. Musculoskeletal: No worrisome lytic or sclerotic osseous abnormality. Small sclerotic lesion in the left seventh lateral rib is stable, likely bone island. IMPRESSION: 1. Interval decrease in size of the cavitary posterior right upper lobe pulmonary lesion. 2. New ill-defined irregular 10 x 6 mm anterior right upper lobe pulmonary nodule. This may be infectious/inflammatory. Close attention on follow-up recommended. 3. Similar appearance of soft tissue fullness in the left hilum with patient noted to have left hilar hypermetabolism on previous PET-CT. No discrete left hilar mass on the current study. 4. Stable 13 mm subcarinal lymph node. 5. The peribronchovascular nodularity seen on the 01/21/2022 exam in the left upper lobe in both lower lobes has largely resolved in the interval suggesting infectious/inflammatory etiology. 6. Aortic Atherosclerosis (ICD10-I70.0) and Emphysema (ICD10-J43.9). Electronically Signed   By: EMisty StanleyM.D.   On: 06/21/2022 07:52   LONG TERM MONITOR (3-14 DAYS)  Result Date: 05/30/2022 Patch Wear Time:  2 days and 9 hours (2023-09-01T00:10:15-0400 to 2023-09-03T09:55:31-0400) Patient had a min HR of 55 bpm, max HR of 167 bpm, and avg HR of 83 bpm. Predominant underlying rhythm was Sinus Rhythm. Bundle Branch Block/IVCD was present. 2 Ventricular Tachycardia runs occurred, the run with the fastest interval lasting 5 beats with  a max rate of 167 bpm, the longest lasting 6 beats with an avg rate of 121 bpm. Isolated SVEs were occasional (2.9%, 8106), SVE Couplets were rare (<1.0%, 103), and SVE Triplets were rare (<1.0%, 5). Isolated VEs were frequent (5.1%, 14483), VE Couplets  were rare (<1.0%, 25), and VE Triplets were rare (<1.0%, 1). Ventricular Bigeminy and Trigeminy were present.  Conclusion 5 beat run of VT noted Frequent PVCs 5.1% burden  noted    ASSESSMENT & PLAN:   Malignant neoplasm of upper lobe of right lung (HCC) # Clinical stage III vs Stage IV [left hilar malignancy with contralateral right lung nodule] -right upper lobe lung cancer non-small cell favor-squamous cell; Left Hilar- Bx- squamous cell.  PET MAY 68EA- Hypermetabolic cavitary mass of the right upper lobe; Hypermetabolic left hilar mass versus conglomerate hilar lymphadenopathy, possibly due to nodal metastatic disease or primary lung malignancy. NGS-F-ONE- PDL-1 0%; no targets**.  Currently status post with Carbo- Abraxane with RT [facility] 26th - 07/26; Taxol:allergy] weekly #6 chemotherapy [uly 25 th]. Right upper lobe mass -also SBRT [08/07- 8-21]. OCT 5th, 2023-  Interval decrease in size of the cavitary posterior right upper lobe pulmonary lesion; stable left hilar fullness [status post chemoradiation]  # # patient will proceed with immunotherapy-Durvalumab today- OCT 2023- TSH-WNL  # CT scan OCT 2023-. New ill-defined irregular 10 x 6 mm anterior right upper lobe pulmonary nodule-benign versus malignant.  Await imaging in January as per radiation oncology.   # Constipation- sec to chemo- recommend Miralax once or twice as needed. STABLE.   # Smoking: Active smoker; quit smoking recently. OFF  nicotine patches. STABLE.   # COPD [Dr.Fleming]: on proair; recommend- compliance with inhalerrs/nebs- if worse pulmonary.   # IV ACCESS: port-functional.    # DISPOSITION: # Durvalumab today.  # follow up in 2 week- MD; labs-port- cbc/cmp;  Durvalumab -Dr.B  # I reviewed the blood work- with the patient in detail; also reviewed the imaging independently [as summarized above]; and with the patient in detail.             All questions were answered. The patient knows to call the clinic with any problems, questions or concerns.       Cammie Sickle, MD 06/26/2022 9:43 AM

## 2022-06-26 NOTE — Assessment & Plan Note (Addendum)
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  PET MAY 79ZP- Hypermetabolic cavitary mass of the right upper lobe; Hypermetabolic left hilar mass versus conglomerate hilar lymphadenopathy, possibly due to nodal metastatic disease or primary lung malignancy. NGS-F-ONE- PDL-1 0%; no targets**.  Currently status post with Carbo- Abraxane with RT [facility] 26th - 07/26; Taxol:allergy] weekly #6 chemotherapy [uly 25 th]. Right upper lobe mass -also SBRT [08/07- 8-21]. OCT 5th, 2023-  Interval decrease in size of the cavitary posterior right upper lobe pulmonary lesion; stable left hilar fullness [status post chemoradiation]  # # patient will proceed with immunotherapy-Durvalumab today- OCT 2023- TSH-WNL  # CT scan OCT 2023-. New ill-defined irregular 10 x 6 mm anterior right upper lobe pulmonary nodule-benign versus malignant.  Await imaging in January as per radiation oncology.  # Constipation- sec to chemo- recommend Miralax once or twice as needed. STABLE.   # Smoking: Active smoker; quit smoking recently. OFF  nicotine patches. STABLE.   # COPD [Dr.Fleming]: on proair; recommend- compliance with inhalerrs/nebs- if worse pulmonary.   # IV ACCESS: port-functional.    # DISPOSITION: # Durvalumab today.  # follow up in 2 week- MD; labs-port- cbc/cmp;  Durvalumab -Dr.B  # I reviewed the blood work- with the patient in detail; also reviewed the imaging independently [as summarized above]; and with the patient in detail.

## 2022-07-10 ENCOUNTER — Encounter: Payer: Self-pay | Admitting: Internal Medicine

## 2022-07-10 ENCOUNTER — Inpatient Hospital Stay (HOSPITAL_BASED_OUTPATIENT_CLINIC_OR_DEPARTMENT_OTHER): Payer: Medicare Other | Admitting: Internal Medicine

## 2022-07-10 ENCOUNTER — Inpatient Hospital Stay: Payer: Medicare Other

## 2022-07-10 VITALS — BP 156/87 | HR 64 | Temp 96.9°F | Resp 16 | Ht 69.0 in | Wt 219.0 lb

## 2022-07-10 DIAGNOSIS — C3411 Malignant neoplasm of upper lobe, right bronchus or lung: Secondary | ICD-10-CM

## 2022-07-10 DIAGNOSIS — Z23 Encounter for immunization: Secondary | ICD-10-CM

## 2022-07-10 DIAGNOSIS — Z5112 Encounter for antineoplastic immunotherapy: Secondary | ICD-10-CM | POA: Diagnosis not present

## 2022-07-10 LAB — COMPREHENSIVE METABOLIC PANEL
ALT: 15 U/L (ref 0–44)
AST: 19 U/L (ref 15–41)
Albumin: 4 g/dL (ref 3.5–5.0)
Alkaline Phosphatase: 76 U/L (ref 38–126)
Anion gap: 5 (ref 5–15)
BUN: 10 mg/dL (ref 8–23)
CO2: 27 mmol/L (ref 22–32)
Calcium: 8.8 mg/dL — ABNORMAL LOW (ref 8.9–10.3)
Chloride: 107 mmol/L (ref 98–111)
Creatinine, Ser: 0.87 mg/dL (ref 0.61–1.24)
GFR, Estimated: >60 mL/min (ref >60)
Glucose, Bld: 101 mg/dL — ABNORMAL HIGH (ref 70–99)
Potassium: 3.9 mmol/L (ref 3.5–5.1)
Sodium: 139 mmol/L (ref 135–145)
Total Bilirubin: 0.8 mg/dL (ref 0.3–1.2)
Total Protein: 7.4 g/dL (ref 6.5–8.1)

## 2022-07-10 LAB — CBC WITH DIFFERENTIAL/PLATELET
Abs Immature Granulocytes: 0 10*3/uL (ref 0.00–0.07)
Basophils Absolute: 0 10*3/uL (ref 0.0–0.1)
Basophils Relative: 1 %
Eosinophils Absolute: 0.1 10*3/uL (ref 0.0–0.5)
Eosinophils Relative: 3 %
HCT: 42.4 % (ref 39.0–52.0)
Hemoglobin: 13.4 g/dL (ref 13.0–17.0)
Immature Granulocytes: 0 %
Lymphocytes Relative: 19 %
Lymphs Abs: 0.8 10*3/uL (ref 0.7–4.0)
MCH: 32 pg (ref 26.0–34.0)
MCHC: 31.6 g/dL (ref 30.0–36.0)
MCV: 101.2 fL — ABNORMAL HIGH (ref 80.0–100.0)
Monocytes Absolute: 0.7 10*3/uL (ref 0.1–1.0)
Monocytes Relative: 17 %
Neutro Abs: 2.4 10*3/uL (ref 1.7–7.7)
Neutrophils Relative %: 60 %
Platelets: 176 10*3/uL (ref 150–400)
RBC: 4.19 MIL/uL — ABNORMAL LOW (ref 4.22–5.81)
RDW: 12.1 % (ref 11.5–15.5)
WBC: 4 10*3/uL (ref 4.0–10.5)
nRBC: 0 % (ref 0.0–0.2)

## 2022-07-10 MED ORDER — SODIUM CHLORIDE 0.9 % IV SOLN
Freq: Once | INTRAVENOUS | Status: AC
  Filled 2022-07-10: qty 250

## 2022-07-10 MED ORDER — SODIUM CHLORIDE 0.9 % IV SOLN
10.0000 mg/kg | Freq: Once | INTRAVENOUS | Status: AC
  Administered 2022-07-10: 1000 mg via INTRAVENOUS
  Filled 2022-07-10: qty 20

## 2022-07-10 MED ORDER — HEPARIN SOD (PORK) LOCK FLUSH 100 UNIT/ML IV SOLN
500.0000 [IU] | Freq: Once | INTRAVENOUS | Status: AC | PRN
  Administered 2022-07-10: 500 [IU]
  Filled 2022-07-10: qty 5

## 2022-07-10 MED ORDER — SODIUM CHLORIDE 0.9% FLUSH
10.0000 mL | INTRAVENOUS | Status: DC | PRN
  Administered 2022-07-10: 10 mL
  Filled 2022-07-10: qty 10

## 2022-07-10 MED ORDER — BENZONATATE 100 MG PO CAPS: 100.0000 mg | ORAL_CAPSULE | Freq: Three times a day (TID) | ORAL | 1 refills | Status: DC | PRN

## 2022-07-10 MED ORDER — INFLUENZA VAC A&B SA ADJ QUAD 0.5 ML IM PRSY
0.5000 mL | PREFILLED_SYRINGE | Freq: Once | INTRAMUSCULAR | Status: AC
  Administered 2022-07-10: 0.5 mL via INTRAMUSCULAR
  Filled 2022-07-10: qty 0.5

## 2022-07-10 MED ORDER — HEPARIN SOD (PORK) LOCK FLUSH 100 UNIT/ML IV SOLN
INTRAVENOUS | Status: AC
  Filled 2022-07-10: qty 5

## 2022-07-10 NOTE — Progress Notes (Signed)
Patient has a cough for 3-4 weeks that is getting worse.  Tingling sensation on left ear that radiates to shoulder for 2 weeks.

## 2022-07-10 NOTE — Assessment & Plan Note (Addendum)
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  PET MAY 55DD- Hypermetabolic cavitary mass of the right upper lobe; Hypermetabolic left hilar mass versus conglomerate hilar lymphadenopathy, possibly due to nodal metastatic disease or primary lung malignancy. NGS-F-ONE- PDL-1 0%; no targets**.  Currently status post with Carbo- Abraxane with RT [facility] 26th - 07/26; Taxol:allergy] weekly #6 chemotherapy [uly 25 th]. Right upper lobe mass -also SBRT [08/07- 8-21]. OCT 5th, 2023-  Interval decrease in size of the cavitary posterior right upper lobe pulmonary lesion; stable left hilar fullness [status post chemoradiation]. Patient currently on adjuvant immunotherapy with Durvalumab.    # patient will proceed with immunotherapy-Durvalumab #2 Today. Labs today reviewed;  acceptable for treatment today.  OCT 2023- TSH-WNL.  Again discussed that the treatments are ongoing for total of 1 year.  # CT scan OCT 2023-. New ill-defined irregular 10 x 6 mm anterior right upper lobe pulmonary nodule-benign versus malignant.  Await imaging in January as per radiation oncology.  # Constipation- sec to chemo- recommend Miralax once or twice as needed. STABLE.   # Smoking: Active smoker; quit smoking recently. OFF  nicotine patches. STABLE.   # COPD [Dr.Fleming]: on proair; recommend- compliance with inhalerrs/nebs; cough is worse -recommend Tessalon pearls prn for cough.  Prescription given.  # IV ACCESS: port-functional.   # Vaccination: Flu shot today   # DISPOSITION:  # Flu shot today  # Durvalumab today.  # follow up in 2 week- MD; labs-port- cbc/cmp;  Durvalumab -Dr.B

## 2022-07-10 NOTE — Progress Notes (Signed)
[name] NOTE  Patient Care Team: [place] as PCP - General Telford Nab, RN as Oncology Nurse Navigator Cammie Sickle, MD as Consulting Physician (Oncology)  CHIEF COMPLAINTS/PURPOSE OF CONSULTATION: lung cancer  #  Oncology History Overview Note  IMPRESSION: 1. Thick-walled cavitary lesion of the posterior right upper lobe abutting the major fissure measuring 3.3 x 3.3 cm, highly concerning for primary lung malignancy or metastasis. 2. Although evaluation is limited by lack of intravenous contrast, suspect significant left hilar lymphadenopathy or mass, with apparent obstruction of the left upper lobe bronchus and severe narrowing of the left lower lobe bronchus. This finding is likewise highly concerning for primary lung malignancy or alternately nodal metastasis. Contrast enhanced CT may be helpful to more clearly evaluate the hilum. 3. There may be additional right hilar lymphadenopathy, again assessment limited by noncontrast CT. 4. Scattered heterogeneous and ground-glass airspace opacity throughout the upper lobes and right lower lobe, possibly reflecting postobstructive airspace disease in the left lung generally nonspecific. 5. Emphysema. 6. Coronary artery disease.   Aortic Atherosclerosis (ICD10-I70.0) and Emphysema (ICD10-J43.9).     Electronically Signed   By: Delanna Ahmadi M.D.   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  Patient also status post SBRT to the right lung nodule.   # CAD; COPD [Dr.Fleming- O2 none now]; DM; HTN   Malignant neoplasm of upper lobe of right lung ([place])  02/01/2022 Initial Diagnosis    Malignant neoplasm of upper lobe of right lung ([place])   02/01/2022 Cancer Staging   Staging form: Lung, AJCC 8th Edition - Clinical: Stage IIIB (cT2a, cN3, cM0) - Signed by Cammie Sickle, MD on 02/01/2022   02/19/2022 - 04/09/2022 Chemotherapy   Patient is on Treatment Plan : LUNG Carboplatin / Paclitaxel + XRT q7d     06/26/2022 -  Chemotherapy   Patient is on Treatment Plan : LUNG Durvalumab (10) q14d        HISTORY OF PRESENTING ILLNESS: Alone.  Ambulating independently.  Louis Sullivan 71 y.o.  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   Worsening cough; no phlegm.  No hemoptysis.  Denies any worsening shortness of breath.  Warm sensation left side of neck. Intermittently- no pain. Denies any headaches or vision changes.  No ongoing diarrhea.   Review of Systems  Constitutional:  Negative for chills, diaphoresis, fever, malaise/fatigue and weight loss.  HENT:  Negative for nosebleeds and sore throat.   Eyes:  Negative for double vision.  Respiratory:  Positive for cough and shortness of breath. Negative for hemoptysis, sputum production and wheezing.   Cardiovascular:  Negative for chest pain, palpitations, orthopnea and leg swelling.  Gastrointestinal:  Negative for abdominal pain, blood in stool, constipation, diarrhea, heartburn, melena, nausea and vomiting.  Genitourinary:  Negative for dysuria, frequency and urgency.  Musculoskeletal:  Positive for joint pain. Negative for back pain.  Skin: Negative.  Negative for itching and rash.  Neurological:  Negative for dizziness, tingling, focal weakness, weakness and headaches.  Endo/Heme/Allergies:  Does not bruise/bleed easily.  Psychiatric/Behavioral:  Negative for depression. The patient is not nervous/anxious  and does not have insomnia.      MEDICAL HISTORY:   Past Medical History:  Diagnosis Date  . COPD (chronic obstructive pulmonary disease) ([place])   . Coronary artery disease    1 stent placed in about 2008  . Diabetes mellitus without complication ([place])   . Dyspnea   . Headache    migraines  . Hyperlipidemia   . Hypertension   . Pneumonia 12/2021   hospitalized at [place]: Past Surgical History:  Procedure Laterality Date  . CARDIAC CATHETERIZATION    . COLONOSCOPY    . IR IMAGING GUIDED PORT INSERTION  02/12/2022  . VIDEO BRONCHOSCOPY WITH ENDOBRONCHIAL NAVIGATION N/A 01/23/2022   Procedure: VIDEO BRONCHOSCOPY WITH ENDOBRONCHIAL NAVIGATION;  Surgeon: Ottie Glazier, MD;  Location: [facility] ORS;  Service: Thoracic;  Laterality: N/A;  . VIDEO BRONCHOSCOPY WITH ENDOBRONCHIAL ULTRASOUND N/A 01/23/2022   Procedure: VIDEO BRONCHOSCOPY WITH ENDOBRONCHIAL ULTRASOUND;  Surgeon: Ottie Glazier, MD;  Location: [facility] ORS;  Service: Thoracic;  Laterality: N/A;    SOCIAL HISTORY: Social History   Socioeconomic History  . Marital status: Widowed    Spouse name: Not on file  . Number of children: Not on file  . Years of education: Not on file  . Highest education level: Not on file  Occupational History  . Not on file  Tobacco Use  . Smoking status: Former    Packs/day: 1.00    Years: 20.00    Total pack years: 20.00    Types: Cigarettes    Quit date: 02/11/2022    Years since quitting: 0.4  . Smokeless tobacco: Never  . Tobacco comments:    Request to MD for Nicotine patch to help him quit smoking.  Vaping Use  . Vaping Use: Never used  Substance and Sexual Activity  . Alcohol use: Not Currently  . Drug use: Never  . Sexual activity: Not on file  Other Topics Concern  . 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 Lives in [place] self. Son passed in 2021;MI wife passed away in 1990s/aneurysm; daughter in [place].    Social Determinants of Health   Financial Resource  Strain: Not on file  Food Insecurity: Not on file  Transportation Needs: Not on file  Physical Activity: Not on file  Stress: Not on file  Social Connections: Not on file  Intimate Partner Violence: Not on file    FAMILY HISTORY: Family History  Problem Relation Age of Onset  . Throat cancer Brother     ALLERGIES:  is allergic to paclitaxel.  MEDICATIONS:  Current Outpatient Medications  Medication Sig Dispense Refill  . albuterol (PROVENTIL) (5 MG/ML) 0.5% nebulizer solution Take 2.5 mg by nebulization every morning.    Marland Kitchen albuterol (VENTOLIN HFA) 108 (90 Base) MCG/ACT inhaler Inhale 2 puffs into the lungs every 4 (four) hours as needed for wheezing or shortness of breath.    Marland Kitchen aspirin EC 81 MG tablet Take 81 mg by mouth daily.    Marland Kitchen atorvastatin (LIPITOR) 80 MG tablet Take 1 tablet (80 mg total) by mouth every morning. 90 tablet 1  . benzonatate (TESSALON) 100 MG capsule Take 1 capsule (100 mg total) by mouth 3 (three) times daily as needed for cough. 45 capsule 1  . blood glucose meter kit and supplies KIT Dispense based on patient and insurance preference. Use up to four times daily as directed. 1 each 0  . Cholecalciferol (VITAMIN D3 PO) Take  1 tablet by mouth daily at 6 (six) AM.    . hydrocortisone 2.5 % ointment Apply topically 2 (two) times daily. Apply to skin rash twice a day as needed for itch. 30 g 0  . lidocaine-prilocaine (EMLA) cream Apply on the port. 30 -45 min  prior to port access. 30 g 3  . losartan (COZAAR) 100 MG tablet Take 100 mg by mouth every evening.    . metFORMIN (GLUCOPHAGE) 1000 MG tablet Take 1,000 mg by mouth daily with breakfast.    . metoprolol succinate (TOPROL XL) 25 MG 24 hr tablet Take 1 tablet (25 mg total) by mouth daily. 30 tablet 3  . Nicotine 21-14-7 MG/24HR KIT Place 1 patch onto the skin daily. (Patient not taking: Reported on 06/19/2022) 60 kit 1  . ondansetron (ZOFRAN) 8 MG tablet One pill every 8 hours as needed for nausea/vomitting.  (Patient not taking: Reported on 06/19/2022) 40 tablet 1  . OXYGEN Inhale 2 L into the lungs at bedtime.    . prochlorperazine (COMPAZINE) 10 MG tablet TAKE 1 TABLET(10 MG) BY MOUTH EVERY 6 HOURS AS NEEDED FOR NAUSEA OR VOMITING (Patient not taking: Reported on 06/19/2022) 40 tablet 1  . traZODone (DESYREL) 50 MG tablet Take 50 mg by mouth at bedtime. (Patient not taking: Reported on 06/26/2022)     No current facility-administered medications for this visit.   Facility-Administered Medications Ordered in Other Visits  Medication Dose Route Frequency Provider Last Rate Last Admin  . durvalumab (IMFINZI) 1,000 mg in sodium chloride 0.9 % 100 mL chemo infusion  10 mg/kg (Treatment Plan Recorded) Intravenous Once Louis Sullivan R, MD      . heparin lock flush 100 unit/mL  500 Units Intracatheter Once PRN Louis Sullivan R, MD      . influenza vaccine adjuvanted (FLUAD) injection 0.5 mL  0.5 mL Intramuscular Once Louis Sullivan R, MD      . sodium chloride flush (NS) 0.9 % injection 10 mL  10 mL Intracatheter PRN Cammie Sickle, MD          .  PHYSICAL EXAMINATION: ECOG PERFORMANCE STATUS: 1 - Symptomatic but completely ambulatory  Vitals:   07/10/22 1100  BP: (!) 156/87  Pulse: 64  Resp: 16  Temp: (!) 96.9 F (36.1 C)  SpO2: 95%     Filed Weights   07/10/22 1100  Weight: 219 lb (99.3 kg)      Physical Exam Vitals and nursing note reviewed.  HENT:     Head: Normocephalic and atraumatic.     Mouth/Throat:     Pharynx: Oropharynx is clear.  Eyes:     Extraocular Movements: Extraocular movements intact.     Pupils: Pupils are equal, round, and reactive to light.  Cardiovascular:     Rate and Rhythm: Normal rate and regular rhythm.  Pulmonary:     Comments: Decreased breath sounds bilaterally.  Abdominal:     Palpations: Abdomen is soft.  Musculoskeletal:        General: Normal range of motion.     Cervical back: Normal range of motion.  Skin:     General: Skin is warm.  Neurological:     General: No focal deficit present.     Mental Status: He is alert and oriented to person, place, and time.  Psychiatric:        Behavior: Behavior normal.        Judgment: Judgment normal.     LABORATORY DATA:  I have reviewed the  data as listed Lab Results  Component Value Date   WBC 4.0 07/10/2022   HGB 13.4 07/10/2022   HCT 42.4 07/10/2022   MCV 101.2 (H) 07/10/2022   PLT 176 07/10/2022   Recent Labs    04/25/22 0944 06/19/22 1309 07/10/22 1006  NA 140 139 139  K 3.7 3.8 3.9  CL 106 104 107  CO2 27 28 27   GLUCOSE 105* 76 101*  BUN 8 10 10   CREATININE 0.99 0.86 0.87  CALCIUM 8.9 9.1 8.8*  GFRNONAA >60 >60 >60  PROT 6.7 7.4 7.4  ALBUMIN 3.8 4.1 4.0  AST 22 18 19   ALT 18 17 15   ALKPHOS 64 79 76  BILITOT 0.4 0.6 0.8    RADIOGRAPHIC STUDIES: I have personally reviewed the radiological images as listed and agreed with the findings in the report. CT CHEST W CONTRAST  Result Date: 06/21/2022 CLINICAL DATA:  Non-small-cell lung cancer. Restaging. * Tracking Code: BO * EXAM: CT CHEST WITH CONTRAST TECHNIQUE: Multidetector CT imaging of the chest was performed during intravenous contrast administration. RADIATION DOSE REDUCTION: This exam was performed according to the departmental dose-optimization program which includes automated exposure control, adjustment of the mA and/or kV according to patient size and/or use of iterative reconstruction technique. CONTRAST:  17m OMNIPAQUE IOHEXOL 300 MG/ML  SOLN COMPARISON:  PET-CT 02/08/2022.  Chest CT 01/21/2022. FINDINGS: Cardiovascular: The heart size is normal. No substantial pericardial effusion. Coronary artery calcification is evident. Moderate atherosclerotic calcification is noted in the wall of the thoracic aorta. Right Port-A-Cath tip is positioned at the SVC/RA junction. Mediastinum/Nodes: 13 mm subcarinal short axis lymph node on 74/2 is stable. Soft tissue fullness noted in the  left hilum with patient noted to have left hilar hypermetabolism on previous PET-CT. No discrete left hilar mass on the current study. No right hilar lymphadenopathy. The esophagus has normal imaging features. There is no axillary lymphadenopathy. Lungs/Pleura: Extensive changes of centrilobular and paraseptal emphysema. Cavitary posterior right upper lobe pulmonary lesion measured previously at 3.3 x 3.3 cm is now 1.9 x 1.9 cm on image 41/3. A new ill-defined irregular 10 x 6 mm nodule is seen in the anterior right upper lobe on 54/3. Otherwise no new suspicious pulmonary nodule or mass. No focal airspace consolidation. There is no evidence of pleural effusion. Upper Abdomen: Unremarkable. Musculoskeletal: No worrisome lytic or sclerotic osseous abnormality. Small sclerotic lesion in the left seventh lateral rib is stable, likely bone island. IMPRESSION: 1. Interval decrease in size of the cavitary posterior right upper lobe pulmonary lesion. 2. New ill-defined irregular 10 x 6 mm anterior right upper lobe pulmonary nodule. This may be infectious/inflammatory. Close attention on follow-up recommended. 3. Similar appearance of soft tissue fullness in the left hilum with patient noted to have left hilar hypermetabolism on previous PET-CT. No discrete left hilar mass on the current study. 4. Stable 13 mm subcarinal lymph node. 5. The peribronchovascular nodularity seen on the 01/21/2022 exam in the left upper lobe in both lower lobes has largely resolved in the interval suggesting infectious/inflammatory etiology. 6. Aortic Atherosclerosis (ICD10-I70.0) and Emphysema (ICD10-J43.9). Electronically Signed   By: EMisty StanleyM.D.   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   PET MAY 282ME Hypermetabolic cavitary mass of  the right upper lobe; Hypermetabolic left hilar mass versus conglomerate hilar lymphadenopathy, possibly due to nodal metastatic disease or primary lung malignancy. NGS-F-ONE- PDL-1 0%; no targets**.  Currently status post with Carbo- Abraxane with RT [facility] 26th - 07/26; Taxol:allergy] weekly #6 chemotherapy [uly 25 th]. Right upper lobe mass -also SBRT [08/07- 8-21]. OCT 5th, 2023-  Interval decrease in size of the cavitary posterior right upper lobe pulmonary lesion; stable left hilar fullness [status post chemoradiation]. Patient currently on adjuvant immunotherapy with Durvalumab.    # patient will proceed with immunotherapy-Durvalumab #2 Today. Labs today reviewed;  acceptable for treatment today.  OCT 2023- TSH-WNL.  Again discussed that the treatments are ongoing for total of 1 year.  # CT scan OCT 2023-. New ill-defined irregular 10 x 6 mm anterior right upper lobe pulmonary nodule-benign versus malignant.  Await imaging in January as per radiation oncology.   # Constipation- sec to chemo- recommend Miralax once or twice as needed. STABLE.   # Smoking: Active smoker; quit smoking recently. OFF  nicotine patches. STABLE.   # COPD [Dr.Fleming]: on proair; recommend- compliance with inhalerrs/nebs; cough is worse -recommend Tessalon pearls prn for cough.  Prescription given.  # IV ACCESS: port-functional.   # Vaccination: Flu shot today   # DISPOSITION:  # Flu shot today  # Durvalumab today.  # follow up in 2 week- MD; labs-port- cbc/cmp;  Durvalumab -Dr.B             All questions were answered. The patient knows to call the clinic with any problems, questions or concerns.       Cammie Sickle, MD 07/10/2022 11:55 AM

## 2022-07-10 NOTE — Patient Instructions (Signed)
[facility] CANCER CTR AT [place]  Discharge Instructions: Thank you for choosing [facility] to provide your oncology and hematology care.  If you have a lab appointment with the [facility], please go directly to the [facility] and check in at the registration area.  Wear comfortable clothing and clothing appropriate for easy access to any Portacath or PICC line.   We strive to give you quality time with your provider. You may need to reschedule your appointment if you arrive late (15 or more minutes).  Arriving late affects you and other patients whose appointments are after yours.  Also, if you miss three or more appointments without notifying the office, you may be dismissed from the clinic at the provider's discretion.      For prescription refill requests, have your pharmacy contact our office and allow 72 hours for refills to be completed.    Today you received the following chemotherapy and/or immunotherapy agents - Durvalumab      To help prevent nausea and vomiting after your treatment, we encourage you to take your nausea medication as directed.  BELOW ARE SYMPTOMS THAT SHOULD BE REPORTED IMMEDIATELY: *FEVER GREATER THAN 100.4 F (38 C) OR HIGHER *CHILLS OR SWEATING *NAUSEA AND VOMITING THAT IS NOT CONTROLLED WITH YOUR NAUSEA MEDICATION *UNUSUAL SHORTNESS OF BREATH *UNUSUAL BRUISING OR BLEEDING *URINARY PROBLEMS (pain or burning when urinating, or frequent urination) *BOWEL PROBLEMS (unusual diarrhea, constipation, pain near the anus) TENDERNESS IN MOUTH AND THROAT WITH OR WITHOUT PRESENCE OF ULCERS (sore throat, sores in mouth, or a toothache) UNUSUAL RASH, SWELLING OR PAIN  UNUSUAL VAGINAL DISCHARGE OR ITCHING   Items with * indicate a potential emergency and should be followed up as soon as possible or go to the Emergency Department if any problems should occur.  Please show the CHEMOTHERAPY ALERT CARD or IMMUNOTHERAPY ALERT CARD at check-in  to the Emergency Department and triage nurse.  Should you have questions after your visit or need to cancel or reschedule your appointment, please contact [facility] [place] AT [place]  [phone] and follow the prompts.  Office hours are 8:00 a.m. to 4:30 p.m. Monday - Friday. Please note that voicemails left after 4:00 p.m. may not be returned until the following business day.  We are closed weekends and major holidays. You have access to a nurse at all times for urgent questions. Please call the main number to the clinic [phone] and follow the prompts.  For any non-urgent questions, you may also contact your provider using MyChart. We now offer e-Visits for anyone 36 and older to request care online for non-urgent symptoms. For details visit [email or web address].   Also download the MyChart app! Go to the app store, search "MyChart", open the app, select [facility], and log in with your MyChart username and password.  Masks are optional in the cancer centers. If you would like for your care team to wear a mask while they are taking care of you, please let them know. For doctor visits, patients may have with them one support person who is at least 71 years old. At this time, visitors are not allowed in the infusion area.  Durvalumab Injection What is this medication? DURVALUMAB (dur VAL ue mab) treats some types of cancer. It works by helping your immune system slow or stop the spread of cancer cells. It is a monoclonal antibody. This medicine may be used for other purposes; ask your health care provider or pharmacist if you have questions.  COMMON BRAND NAME(S): IMFINZI What should I tell my care team before I take this medication? They need to know if you have any of these conditions: Allogeneic stem cell transplant (uses someone else's stem cells) Autoimmune diseases, such as Crohn disease, ulcerative colitis, lupus History of chest radiation Nervous system problems,  such as Guillain-Barre syndrome, myasthenia gravis Organ transplant An unusual or allergic reaction to durvalumab, other medications, foods, dyes, or preservatives Pregnant or trying to get pregnant Breast-feeding How should I use this medication? This medication is infused into a vein. It is given by your care team in a hospital or clinic setting. A special MedGuide will be given to you before each treatment. Be sure to read this information carefully each time. Talk to your care team about the use of this medication in children. Special care may be needed. Overdosage: If you think you have taken too much of this medicine contact a poison control center or emergency room at once. NOTE: This medicine is only for you. Do not share this medicine with others. What if I miss a dose? Keep appointments for follow-up doses. It is important not to miss your dose. Call your care team if you are unable to keep an appointment. What may interact with this medication? Interactions have not been studied. This list may not describe all possible interactions. Give your health care provider a list of all the medicines, herbs, non-prescription drugs, or dietary supplements you use. Also tell them if you smoke, drink alcohol, or use illegal drugs. Some items may interact with your medicine. What should I watch for while using this medication? Your condition will be monitored carefully while you are receiving this medication. You may need blood work while taking this medication. This medication may cause serious skin reactions. They can happen weeks to months after starting the medication. Contact your care team right away if you notice fevers or flu-like symptoms with a rash. The rash may be red or purple and then turn into blisters or peeling of the skin. You may also notice a red rash with swelling of the face, lips, or lymph nodes in your neck or under your arms. Tell your care team right away if you have any  change in your eyesight. Talk to your care team if you may be pregnant. Serious birth defects can occur if you take this medication during pregnancy and for 3 months after the last dose. You will need a negative pregnancy test before starting this medication. Contraception is recommended while taking this medication and for 3 months after the last dose. Your care team can help you find the option that works for you. Do not breastfeed while taking this medication and for 3 months after the last dose. What side effects may I notice from receiving this medication? Side effects that you should report to your care team as soon as possible: Allergic reactions--skin rash, itching, hives, swelling of the face, lips, tongue, or throat Dry cough, shortness of breath or trouble breathing Eye pain, redness, irritation, or discharge with blurry or decreased vision Heart muscle inflammation--unusual weakness or fatigue, shortness of breath, chest pain, fast or irregular heartbeat, dizziness, swelling of the ankles, feet, or hands Hormone gland problems--headache, sensitivity to light, unusual weakness or fatigue, dizziness, fast or irregular heartbeat, increased sensitivity to cold or heat, excessive sweating, constipation, hair loss, increased thirst or amount of urine, tremors or shaking, irritability Infusion reactions--chest pain, shortness of breath or trouble breathing, feeling faint or lightheaded Kidney  injury (glomerulonephritis)--decrease in the amount of urine, red or dark brown urine, foamy or bubbly urine, swelling of the ankles, hands, or feet Liver injury--right upper belly pain, loss of appetite, nausea, light-colored stool, dark yellow or brown urine, yellowing skin or eyes, unusual weakness or fatigue Pain, tingling, or numbness in the hands or feet, muscle weakness, change in vision, confusion or trouble speaking, loss of balance or coordination, trouble walking, seizures Rash, fever, and swollen  lymph nodes Redness, blistering, peeling, or loosening of the skin, including inside the mouth Sudden or severe stomach pain, bloody diarrhea, fever, nausea, vomiting Side effects that usually do not require medical attention (report these to your care team if they continue or are bothersome): Bone, joint, or muscle pain Diarrhea Fatigue Loss of appetite Nausea Skin rash This list may not describe all possible side effects. Call your doctor for medical advice about side effects. You may report side effects to FDA at 1-800-FDA-1088. Where should I keep my medication? This medication is given in a hospital or clinic. It will not be stored at home. NOTE: This sheet is a summary. It may not cover all possible information. If you have questions about this medicine, talk to your doctor, pharmacist, or health care provider.  2023 Elsevier/Gold Standard (2021-02-21 00:00:00)

## 2022-07-24 ENCOUNTER — Encounter: Payer: Self-pay | Admitting: Medical Oncology

## 2022-07-24 ENCOUNTER — Inpatient Hospital Stay: Payer: Medicare Other | Attending: Internal Medicine

## 2022-07-24 ENCOUNTER — Inpatient Hospital Stay: Payer: Medicare Other

## 2022-07-24 ENCOUNTER — Inpatient Hospital Stay (HOSPITAL_BASED_OUTPATIENT_CLINIC_OR_DEPARTMENT_OTHER): Payer: Medicare Other | Admitting: Medical Oncology

## 2022-07-24 ENCOUNTER — Other Ambulatory Visit: Payer: Self-pay | Admitting: Oncology

## 2022-07-24 ENCOUNTER — Ambulatory Visit: Payer: Medicare Other | Admitting: Internal Medicine

## 2022-07-24 VITALS — Resp 16

## 2022-07-24 VITALS — BP 147/79 | HR 66 | Temp 97.2°F | Ht 69.0 in | Wt 215.0 lb

## 2022-07-24 DIAGNOSIS — C3411 Malignant neoplasm of upper lobe, right bronchus or lung: Secondary | ICD-10-CM | POA: Diagnosis present

## 2022-07-24 DIAGNOSIS — Z95828 Presence of other vascular implants and grafts: Secondary | ICD-10-CM | POA: Diagnosis not present

## 2022-07-24 DIAGNOSIS — C3402 Malignant neoplasm of left main bronchus: Secondary | ICD-10-CM | POA: Diagnosis present

## 2022-07-24 DIAGNOSIS — Z79899 Other long term (current) drug therapy: Secondary | ICD-10-CM | POA: Diagnosis not present

## 2022-07-24 DIAGNOSIS — Z5112 Encounter for antineoplastic immunotherapy: Secondary | ICD-10-CM | POA: Insufficient documentation

## 2022-07-24 DIAGNOSIS — R0602 Shortness of breath: Secondary | ICD-10-CM

## 2022-07-24 LAB — CBC WITH DIFFERENTIAL/PLATELET
Abs Immature Granulocytes: 0.01 10*3/uL (ref 0.00–0.07)
Basophils Absolute: 0 10*3/uL (ref 0.0–0.1)
Basophils Relative: 1 %
Eosinophils Absolute: 0.1 10*3/uL (ref 0.0–0.5)
Eosinophils Relative: 3 %
HCT: 43 % (ref 39.0–52.0)
Hemoglobin: 13.7 g/dL (ref 13.0–17.0)
Immature Granulocytes: 0 %
Lymphocytes Relative: 17 %
Lymphs Abs: 0.7 10*3/uL (ref 0.7–4.0)
MCH: 31.8 pg (ref 26.0–34.0)
MCHC: 31.9 g/dL (ref 30.0–36.0)
MCV: 99.8 fL (ref 80.0–100.0)
Monocytes Absolute: 0.5 10*3/uL (ref 0.1–1.0)
Monocytes Relative: 13 %
Neutro Abs: 2.7 10*3/uL (ref 1.7–7.7)
Neutrophils Relative %: 66 %
Platelets: 178 10*3/uL (ref 150–400)
RBC: 4.31 MIL/uL (ref 4.22–5.81)
RDW: 11.9 % (ref 11.5–15.5)
WBC: 4 10*3/uL (ref 4.0–10.5)
nRBC: 0 % (ref 0.0–0.2)

## 2022-07-24 LAB — TSH: TSH: 1.228 u[IU]/mL (ref 0.350–4.500)

## 2022-07-24 LAB — COMPREHENSIVE METABOLIC PANEL
ALT: 14 U/L (ref 0–44)
AST: 18 U/L (ref 15–41)
Albumin: 4.2 g/dL (ref 3.5–5.0)
Alkaline Phosphatase: 80 U/L (ref 38–126)
Anion gap: 8 (ref 5–15)
BUN: 10 mg/dL (ref 8–23)
CO2: 25 mmol/L (ref 22–32)
Calcium: 8.9 mg/dL (ref 8.9–10.3)
Chloride: 104 mmol/L (ref 98–111)
Creatinine, Ser: 0.9 mg/dL (ref 0.61–1.24)
GFR, Estimated: >60 mL/min (ref >60)
Glucose, Bld: 93 mg/dL (ref 70–99)
Potassium: 3.7 mmol/L (ref 3.5–5.1)
Sodium: 137 mmol/L (ref 135–145)
Total Bilirubin: 0.5 mg/dL (ref 0.3–1.2)
Total Protein: 7.4 g/dL (ref 6.5–8.1)

## 2022-07-24 MED ORDER — SODIUM CHLORIDE 0.9% FLUSH
10.0000 mL | Freq: Once | INTRAVENOUS | Status: AC
  Administered 2022-07-24: 10 mL via INTRAVENOUS
  Filled 2022-07-24: qty 10

## 2022-07-24 MED ORDER — SODIUM CHLORIDE 0.9% FLUSH
10.0000 mL | INTRAVENOUS | Status: DC | PRN
  Administered 2022-07-24: 10 mL
  Filled 2022-07-24: qty 10

## 2022-07-24 MED ORDER — SODIUM CHLORIDE 0.9 % IV SOLN
10.0000 mg/kg | Freq: Once | INTRAVENOUS | Status: AC
  Administered 2022-07-24: 1000 mg via INTRAVENOUS
  Filled 2022-07-24: qty 20

## 2022-07-24 MED ORDER — HEPARIN SOD (PORK) LOCK FLUSH 100 UNIT/ML IV SOLN
INTRAVENOUS | Status: AC
  Filled 2022-07-24: qty 5

## 2022-07-24 MED ORDER — HEPARIN SOD (PORK) LOCK FLUSH 100 UNIT/ML IV SOLN
500.0000 [IU] | Freq: Once | INTRAVENOUS | Status: AC | PRN
  Administered 2022-07-24: 500 [IU]
  Filled 2022-07-24: qty 5

## 2022-07-24 MED ORDER — SODIUM CHLORIDE 0.9 % IV SOLN
Freq: Once | INTRAVENOUS | Status: AC
  Filled 2022-07-24: qty 250

## 2022-07-24 NOTE — Progress Notes (Signed)
[facility] NOTE  Patient Care Team: [facility] as PCP - General Telford Nab, RN as Oncology Nurse Navigator Cammie Sickle, MD as Consulting Physician (Oncology)  CHIEF COMPLAINTS/PURPOSE OF CONSULTATION: lung cancer  #  Oncology History Overview Note  IMPRESSION: 1. Thick-walled cavitary lesion of the posterior right upper lobe abutting the major fissure measuring 3.3 x 3.3 cm, highly concerning for primary lung malignancy or metastasis. 2. Although evaluation is limited by lack of intravenous contrast, suspect significant left hilar lymphadenopathy or mass, with apparent obstruction of the left upper lobe bronchus and severe narrowing of the left lower lobe bronchus. This finding is likewise highly concerning for primary lung malignancy or alternately nodal metastasis. Contrast enhanced CT may be helpful to more clearly evaluate the hilum. 3. There may be additional right hilar lymphadenopathy, again assessment limited by noncontrast CT. 4. Scattered heterogeneous and ground-glass airspace opacity throughout the upper lobes and right lower lobe, possibly reflecting postobstructive airspace disease in the left lung generally nonspecific. 5. Emphysema. 6. Coronary artery disease.   Aortic Atherosclerosis (ICD10-I70.0) and Emphysema (ICD10-J43.9).     Electronically Signed   By: Delanna Ahmadi M.D.   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  Patient also status post SBRT to the right lung nodule.   # CAD; COPD [Dr.Fleming- O2 none now]; DM; HTN   Malignant neoplasm of upper lobe of right lung ([place])  02/01/2022 Initial Diagnosis    Malignant neoplasm of upper lobe of right lung ([place])   02/01/2022 Cancer Staging   Staging form: Lung, AJCC 8th Edition - Clinical: Stage IIIB (cT2a, cN3, cM0) - Signed by Cammie Sickle, MD on 02/01/2022   02/19/2022 - 04/09/2022 Chemotherapy   Patient is on Treatment Plan : LUNG Carboplatin / Paclitaxel + XRT q7d     06/26/2022 -  Chemotherapy   Patient is on Treatment Plan : LUNG Durvalumab (10) q14d        HISTORY OF PRESENTING ILLNESS: Alone.  Ambulating independently.  Louis Sullivan 71 y.o.  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   Has chronic cough and mild SOB- unchanged.  No hemoptysis.  Denies any worsening shortness of breath.He denies any headaches or vision changes. No GI symptoms of diarrhea, nausea, vomiting   Tolerating his treatment well.   Has lost some weight- he reports that sometimes he does not feel like eating.   Wt Readings from Last 3 Encounters:  07/24/22 215 lb (97.5 kg)  07/10/22 219 lb (99.3 kg)  06/26/22 218 lb (98.9 kg)    Review of Systems  Constitutional:  Negative for chills, diaphoresis, fever, malaise/fatigue and weight loss.  HENT:  Negative for nosebleeds and sore throat.   Eyes:  Negative for double vision.  Respiratory:  Positive for cough and shortness of breath. Negative for hemoptysis, sputum production and wheezing.   Cardiovascular:  Negative for chest pain, palpitations, orthopnea and leg swelling.  Gastrointestinal:  Negative for abdominal pain, blood in stool, constipation, diarrhea, heartburn, melena, nausea and vomiting.  Genitourinary:  Negative for dysuria, frequency and urgency.  Musculoskeletal:  Positive for  joint pain. Negative for back pain.  Skin: Negative.  Negative for itching and rash.  Neurological:  Negative for dizziness, tingling, focal  weakness, weakness and headaches.  Endo/Heme/Allergies:  Does not bruise/bleed easily.  Psychiatric/Behavioral:  Negative for depression. The patient is not nervous/anxious and does not have insomnia.      MEDICAL HISTORY:  Past Medical History:  Diagnosis Date   COPD (chronic obstructive pulmonary disease) ([place])    Coronary artery disease    1 stent placed in about 2008   Diabetes mellitus without complication (HCC)    Dyspnea    Headache    migraines   Hyperlipidemia    Hypertension    Pneumonia 12/2021   hospitalized at [facility]: Past Surgical History:  Procedure Laterality Date   CARDIAC CATHETERIZATION     COLONOSCOPY     IR IMAGING GUIDED PORT INSERTION  02/12/2022   VIDEO BRONCHOSCOPY WITH ENDOBRONCHIAL NAVIGATION N/A 01/23/2022   Procedure: VIDEO BRONCHOSCOPY WITH ENDOBRONCHIAL NAVIGATION;  Surgeon: Ottie Glazier, MD;  Location: [facility] ORS;  Service: Thoracic;  Laterality: N/A;   VIDEO BRONCHOSCOPY WITH ENDOBRONCHIAL ULTRASOUND N/A 01/23/2022   Procedure: VIDEO BRONCHOSCOPY WITH ENDOBRONCHIAL ULTRASOUND;  Surgeon: Ottie Glazier, MD;  Location: [facility] ORS;  Service: Thoracic;  Laterality: N/A;    SOCIAL HISTORY: Social History   Socioeconomic History   Marital status: Widowed    Spouse name: Not on file   Number of children: Not on file   Years of education: Not on file   Highest education level: Not on file  Occupational History   Not on file  Tobacco Use   Smoking status: Former    Packs/day: 1.00    Years: 20.00    Total pack years: 20.00    Types: Cigarettes    Quit date: 02/11/2022    Years since quitting: 0.4   Smokeless tobacco: Never   Tobacco comments:    Request to MD for Nicotine patch to help him quit smoking.  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 Lives in [place] self. Son passed in 2021;MI wife passed away in 1990s/aneurysm; daughter in [place].    Social Determinants of Health   Financial Resource Strain: Not on file  Food Insecurity: Not on file  Transportation Needs: Not on file  Physical Activity: Not on file  Stress: Not on file  Social Connections: Not on file  Intimate Partner Violence: Not on file    FAMILY HISTORY: Family History  Problem Relation Age of Onset   Throat cancer Brother     ALLERGIES:  is allergic to paclitaxel.  MEDICATIONS:  Current Outpatient Medications  Medication Sig Dispense Refill   albuterol (PROVENTIL) (5 MG/ML) 0.5% nebulizer solution Take 2.5 mg by nebulization every morning.     albuterol (VENTOLIN HFA) 108 (90 Base) MCG/ACT inhaler Inhale 2 puffs into the lungs every 4 (four) hours as needed for wheezing or shortness of breath.     aspirin EC 81 MG tablet Take 81 mg by mouth daily.     atorvastatin (LIPITOR) 80 MG tablet Take 1 tablet (80 mg total) by mouth every morning. 90 tablet 1   benzonatate (TESSALON) 100 MG capsule Take 1 capsule (100 mg total) by mouth  3 (three) times daily as needed for cough. 45 capsule 1   blood glucose meter kit and supplies KIT Dispense based on patient and insurance preference. Use up to four times daily as directed. 1 each 0   Cholecalciferol (VITAMIN D3 PO) Take 1 tablet by mouth daily at 6 (six) AM.     hydrocortisone 2.5 % ointment Apply topically 2 (two) times daily. Apply to skin rash twice a day as needed for itch. 30 g 0   lidocaine-prilocaine (EMLA) cream Apply on the port. 30 -45 min  prior to port access. 30 g 3   losartan (COZAAR) 100 MG tablet Take 100 mg by mouth every evening.     metFORMIN (GLUCOPHAGE) 1000 MG tablet Take 1,000 mg by mouth daily with breakfast.     metoprolol succinate (TOPROL XL) 25 MG 24 hr tablet Take 1 tablet (25 mg total) by mouth daily. 30 tablet 3   Nicotine 21-14-7  MG/24HR KIT Place 1 patch onto the skin daily. 60 kit 1   OXYGEN Inhale 2 L into the lungs at bedtime.     ondansetron (ZOFRAN) 8 MG tablet One pill every 8 hours as needed for nausea/vomitting. (Patient not taking: Reported on 06/19/2022) 40 tablet 1   prochlorperazine (COMPAZINE) 10 MG tablet TAKE 1 TABLET(10 MG) BY MOUTH EVERY 6 HOURS AS NEEDED FOR NAUSEA OR VOMITING (Patient not taking: Reported on 06/19/2022) 40 tablet 1   traZODone (DESYREL) 50 MG tablet Take 50 mg by mouth at bedtime. (Patient not taking: Reported on 06/26/2022)     No current facility-administered medications for this visit.   Facility-Administered Medications Ordered in Other Visits  Medication Dose Route Frequency Provider Last Rate Last Admin   heparin lock flush 100 UNIT/ML injection            PHYSICAL EXAMINATION: ECOG PERFORMANCE STATUS: 1 - Symptomatic but completely ambulatory  Vitals:   07/24/22 0922  BP: (!) 147/79  Pulse: 66  Temp: (!) 97.2 F (36.2 C)     Filed Weights   07/24/22 0922  Weight: 215 lb (97.5 kg)   Physical Exam Vitals and nursing note reviewed.  HENT:     Head: Normocephalic and atraumatic.     Mouth/Throat:     Pharynx: Oropharynx is clear.  Eyes:     Extraocular Movements: Extraocular movements intact.     Pupils: Pupils are equal, round, and reactive to light.  Cardiovascular:     Rate and Rhythm: Normal rate and regular rhythm.  Pulmonary:     Comments: Decreased breath sounds bilaterally.  Abdominal:     Palpations: Abdomen is soft.  Musculoskeletal:        General: Normal range of motion.     Cervical back: Normal range of motion.  Skin:    General: Skin is warm.  Neurological:     General: No focal deficit present.     Mental Status: He is alert and oriented to person, place, and time.  Psychiatric:        Behavior: Behavior normal.        Judgment: Judgment normal.      LABORATORY DATA:  I have reviewed the data as listed Lab Results  Component  Value Date   WBC 4.0 07/24/2022   HGB 13.7 07/24/2022   HCT 43.0 07/24/2022   MCV 99.8 07/24/2022   PLT 178 07/24/2022   Recent Labs    04/25/22 0944 06/19/22 1309 07/10/22 1006  NA 140 139 139  K 3.7  3.8 3.9  CL 106 104 107  CO2 _0 GLUCOSE 105* 76 101*  BUN _1 CREATININE 0.99 0.86 0.87  CALCIUM 8.9 9.1 8.8*  GFRNONAA >60 >60 >60  PROT 6.7 7.4 7.4  ALBUMIN 3.8 4.1 4.0  AST _2 ALT _3 ALKPHOS 64 79 76  BILITOT 0.4 0.6 0.8    RADIOGRAPHIC STUDIES: I have personally reviewed the radiological images as listed and agreed with the findings in the report. No results found.  ASSESSMENT & PLAN:    Malignant neoplasm of upper lobe of right lung (HCC) # Clinical stage III vs Stage IV [left hilar malignancy with contralateral right lung nodule] -right upper lobe lung cancer non-small cell favor-squamous cell; Left Hilar- Bx- squamous cell.  PET MAY 97CB- Hypermetabolic cavitary mass of the right upper lobe; Hypermetabolic left hilar mass versus conglomerate hilar lymphadenopathy, possibly due to nodal metastatic disease or primary lung malignancy. NGS-F-ONE- PDL-1 0%; no targets**.  Currently status post with Carbo- Abraxane with RT [facility] 26th - 07/26; Taxol:allergy] weekly #6 chemotherapy [uly 25 th]. Right upper lobe mass -also SBRT [08/07- 8-21]. OCT 5th, 2023-  Interval decrease in size of the cavitary posterior right upper lobe pulmonary lesion; stable left hilar fullness [status post chemoradiation]. Patient currently on adjuvant immunotherapy with Durvalumab.     # patient will proceed with immunotherapy-Durvalumab #3 Today. Labs today reviewed;  acceptable for treatment today.  OCT 2023- TSH-WNL.Goal of treatment x 1 year. Tolerating well. Labs acceptable for treatment today.    # CT scan OCT 2023-. New ill-defined irregular 10 x 6 mm anterior right upper lobe pulmonary nodule-benign versus malignant.  Await imaging in January as per radiation oncology.  Still our plan to have this completed in Jan.    # Constipation- sec to chemo- recommend Miralax once or twice as needed. He does not report this today. STABLE.    # Smoking: Quit smoking recently.  STABLE.    # COPD [Dr.Fleming]: on proair; recommend- compliance with inhalerrs/nebs; Cough greatly improved with tessalon.    # IV ACCESS: port-functional. Accessed today     # DISPOSITION:   # Durvalumab today Cycle 3 today   # follow up in 2 week- MD; labs-port- cbc/cmp;  Durvalumab         All questions were answered. The patient knows to call the clinic with any problems, questions or concerns.       Louis Closs, PA-C 07/24/2022 9:42 AM

## 2022-07-24 NOTE — Patient Instructions (Signed)
[facility] CANCER CTR AT [place]  Discharge Instructions: Thank you for choosing [facility] to provide your oncology and hematology care.  If you have a lab appointment with the [facility], please go directly to the [facility] and check in at the registration area.  Wear comfortable clothing and clothing appropriate for easy access to any Portacath or PICC line.   We strive to give you quality time with your provider. You may need to reschedule your appointment if you arrive late (15 or more minutes).  Arriving late affects you and other patients whose appointments are after yours.  Also, if you miss three or more appointments without notifying the office, you may be dismissed from the clinic at the provider's discretion.      For prescription refill requests, have your pharmacy contact our office and allow 72 hours for refills to be completed.    Today you received the following chemotherapy and/or immunotherapy agents - Durvalumab      To help prevent nausea and vomiting after your treatment, we encourage you to take your nausea medication as directed.  BELOW ARE SYMPTOMS THAT SHOULD BE REPORTED IMMEDIATELY: *FEVER GREATER THAN 100.4 F (38 C) OR HIGHER *CHILLS OR SWEATING *NAUSEA AND VOMITING THAT IS NOT CONTROLLED WITH YOUR NAUSEA MEDICATION *UNUSUAL SHORTNESS OF BREATH *UNUSUAL BRUISING OR BLEEDING *URINARY PROBLEMS (pain or burning when urinating, or frequent urination) *BOWEL PROBLEMS (unusual diarrhea, constipation, pain near the anus) TENDERNESS IN MOUTH AND THROAT WITH OR WITHOUT PRESENCE OF ULCERS (sore throat, sores in mouth, or a toothache) UNUSUAL RASH, SWELLING OR PAIN  UNUSUAL VAGINAL DISCHARGE OR ITCHING   Items with * indicate a potential emergency and should be followed up as soon as possible or go to the Emergency Department if any problems should occur.  Please show the CHEMOTHERAPY ALERT CARD or IMMUNOTHERAPY ALERT CARD at check-in  to the Emergency Department and triage nurse.  Should you have questions after your visit or need to cancel or reschedule your appointment, please contact [facility] CANCER [place] AT [place]  [phone] and follow the prompts.  Office hours are 8:00 a.m. to 4:30 p.m. Monday - Friday. Please note that voicemails left after 4:00 p.m. may not be returned until the following business day.  We are closed weekends and major holidays. You have access to a nurse at all times for urgent questions. Please call the main number to the clinic [phone] and follow the prompts.  For any non-urgent questions, you may also contact your provider using MyChart. We now offer e-Visits for anyone 31 and older to request care online for non-urgent symptoms. For details visit [email or web address].   Also download the MyChart app! Go to the app store, search "MyChart", open the app, select [place], and log in with your MyChart username and password.  Masks are optional in the cancer centers. If you would like for your care team to wear a mask while they are taking care of you, please let them know. For doctor visits, patients may have with them one support person who is at least 71 years old. At this time, visitors are not allowed in the infusion area.  Durvalumab Injection What is this medication? DURVALUMAB (dur VAL ue mab) treats some types of cancer. It works by helping your immune system slow or stop the spread of cancer cells. It is a monoclonal antibody. This medicine may be used for other purposes; ask your health care provider or pharmacist if you have questions.  COMMON BRAND NAME(S): IMFINZI What should I tell my care team before I take this medication? They need to know if you have any of these conditions: Allogeneic stem cell transplant (uses someone else's stem cells) Autoimmune diseases, such as Crohn disease, ulcerative colitis, lupus History of chest radiation Nervous system problems,  such as Guillain-Barre syndrome, myasthenia gravis Organ transplant An unusual or allergic reaction to durvalumab, other medications, foods, dyes, or preservatives Pregnant or trying to get pregnant Breast-feeding How should I use this medication? This medication is infused into a vein. It is given by your care team in a hospital or clinic setting. A special MedGuide will be given to you before each treatment. Be sure to read this information carefully each time. Talk to your care team about the use of this medication in children. Special care may be needed. Overdosage: If you think you have taken too much of this medicine contact a poison control center or emergency room at once. NOTE: This medicine is only for you. Do not share this medicine with others. What if I miss a dose? Keep appointments for follow-up doses. It is important not to miss your dose. Call your care team if you are unable to keep an appointment. What may interact with this medication? Interactions have not been studied. This list may not describe all possible interactions. Give your health care provider a list of all the medicines, herbs, non-prescription drugs, or dietary supplements you use. Also tell them if you smoke, drink alcohol, or use illegal drugs. Some items may interact with your medicine. What should I watch for while using this medication? Your condition will be monitored carefully while you are receiving this medication. You may need blood work while taking this medication. This medication may cause serious skin reactions. They can happen weeks to months after starting the medication. Contact your care team right away if you notice fevers or flu-like symptoms with a rash. The rash may be red or purple and then turn into blisters or peeling of the skin. You may also notice a red rash with swelling of the face, lips, or lymph nodes in your neck or under your arms. Tell your care team right away if you have any  change in your eyesight. Talk to your care team if you may be pregnant. Serious birth defects can occur if you take this medication during pregnancy and for 3 months after the last dose. You will need a negative pregnancy test before starting this medication. Contraception is recommended while taking this medication and for 3 months after the last dose. Your care team can help you find the option that works for you. Do not breastfeed while taking this medication and for 3 months after the last dose. What side effects may I notice from receiving this medication? Side effects that you should report to your care team as soon as possible: Allergic reactions--skin rash, itching, hives, swelling of the face, lips, tongue, or throat Dry cough, shortness of breath or trouble breathing Eye pain, redness, irritation, or discharge with blurry or decreased vision Heart muscle inflammation--unusual weakness or fatigue, shortness of breath, chest pain, fast or irregular heartbeat, dizziness, swelling of the ankles, feet, or hands Hormone gland problems--headache, sensitivity to light, unusual weakness or fatigue, dizziness, fast or irregular heartbeat, increased sensitivity to cold or heat, excessive sweating, constipation, hair loss, increased thirst or amount of urine, tremors or shaking, irritability Infusion reactions--chest pain, shortness of breath or trouble breathing, feeling faint or lightheaded Kidney  injury (glomerulonephritis)--decrease in the amount of urine, red or dark brown urine, foamy or bubbly urine, swelling of the ankles, hands, or feet Liver injury--right upper belly pain, loss of appetite, nausea, light-colored stool, dark yellow or brown urine, yellowing skin or eyes, unusual weakness or fatigue Pain, tingling, or numbness in the hands or feet, muscle weakness, change in vision, confusion or trouble speaking, loss of balance or coordination, trouble walking, seizures Rash, fever, and swollen  lymph nodes Redness, blistering, peeling, or loosening of the skin, including inside the mouth Sudden or severe stomach pain, bloody diarrhea, fever, nausea, vomiting Side effects that usually do not require medical attention (report these to your care team if they continue or are bothersome): Bone, joint, or muscle pain Diarrhea Fatigue Loss of appetite Nausea Skin rash This list may not describe all possible side effects. Call your doctor for medical advice about side effects. You may report side effects to FDA at 1-800-FDA-1088. Where should I keep my medication? This medication is given in a hospital or clinic. It will not be stored at home. NOTE: This sheet is a summary. It may not cover all possible information. If you have questions about this medicine, talk to your doctor, pharmacist, or health care provider.  2023 Elsevier/Gold Standard (2021-02-21 00:00:00)

## 2022-07-25 LAB — T4: T4, Total: 7.4 ug/dL (ref 4.5–12.0)

## 2022-08-05 ENCOUNTER — Inpatient Hospital Stay: Payer: Medicare Other

## 2022-08-07 ENCOUNTER — Encounter: Payer: Self-pay | Admitting: Oncology

## 2022-08-07 ENCOUNTER — Inpatient Hospital Stay (HOSPITAL_BASED_OUTPATIENT_CLINIC_OR_DEPARTMENT_OTHER): Payer: Medicare Other | Admitting: Oncology

## 2022-08-07 ENCOUNTER — Inpatient Hospital Stay: Payer: Medicare Other

## 2022-08-07 VITALS — BP 134/67 | HR 73 | Temp 97.3°F | Resp 16 | Ht 69.0 in | Wt 216.0 lb

## 2022-08-07 DIAGNOSIS — C3411 Malignant neoplasm of upper lobe, right bronchus or lung: Secondary | ICD-10-CM

## 2022-08-07 DIAGNOSIS — Z5112 Encounter for antineoplastic immunotherapy: Secondary | ICD-10-CM | POA: Diagnosis not present

## 2022-08-07 LAB — COMPREHENSIVE METABOLIC PANEL
ALT: 16 U/L (ref 0–44)
AST: 20 U/L (ref 15–41)
Albumin: 3.9 g/dL (ref 3.5–5.0)
Alkaline Phosphatase: 89 U/L (ref 38–126)
Anion gap: 6 (ref 5–15)
BUN: 11 mg/dL (ref 8–23)
CO2: 26 mmol/L (ref 22–32)
Calcium: 8.7 mg/dL — ABNORMAL LOW (ref 8.9–10.3)
Chloride: 105 mmol/L (ref 98–111)
Creatinine, Ser: 0.97 mg/dL (ref 0.61–1.24)
GFR, Estimated: >60 mL/min (ref >60)
Glucose, Bld: 111 mg/dL — ABNORMAL HIGH (ref 70–99)
Potassium: 3.9 mmol/L (ref 3.5–5.1)
Sodium: 137 mmol/L (ref 135–145)
Total Bilirubin: 0.3 mg/dL (ref 0.3–1.2)
Total Protein: 7 g/dL (ref 6.5–8.1)

## 2022-08-07 LAB — CBC WITH DIFFERENTIAL/PLATELET
Abs Immature Granulocytes: 0 10*3/uL (ref 0.00–0.07)
Basophils Absolute: 0 10*3/uL (ref 0.0–0.1)
Basophils Relative: 1 %
Eosinophils Absolute: 0.2 10*3/uL (ref 0.0–0.5)
Eosinophils Relative: 4 %
HCT: 44.9 % (ref 39.0–52.0)
Hemoglobin: 14.2 g/dL (ref 13.0–17.0)
Immature Granulocytes: 0 %
Lymphocytes Relative: 18 %
Lymphs Abs: 0.7 10*3/uL (ref 0.7–4.0)
MCH: 31.2 pg (ref 26.0–34.0)
MCHC: 31.6 g/dL (ref 30.0–36.0)
MCV: 98.7 fL (ref 80.0–100.0)
Monocytes Absolute: 0.6 10*3/uL (ref 0.1–1.0)
Monocytes Relative: 15 %
Neutro Abs: 2.5 10*3/uL (ref 1.7–7.7)
Neutrophils Relative %: 62 %
Platelets: 158 10*3/uL (ref 150–400)
RBC: 4.55 MIL/uL (ref 4.22–5.81)
RDW: 12 % (ref 11.5–15.5)
WBC: 4 10*3/uL (ref 4.0–10.5)
nRBC: 0 % (ref 0.0–0.2)

## 2022-08-07 MED ORDER — HEPARIN SOD (PORK) LOCK FLUSH 100 UNIT/ML IV SOLN
500.0000 [IU] | Freq: Once | INTRAVENOUS | Status: AC | PRN
  Administered 2022-08-07: 500 [IU]
  Filled 2022-08-07: qty 5

## 2022-08-07 MED ORDER — SODIUM CHLORIDE 0.9 % IV SOLN
10.0000 mg/kg | Freq: Once | INTRAVENOUS | Status: AC
  Administered 2022-08-07: 1000 mg via INTRAVENOUS
  Filled 2022-08-07: qty 20

## 2022-08-07 MED ORDER — SODIUM CHLORIDE 0.9 % IV SOLN
Freq: Once | INTRAVENOUS | Status: AC
  Filled 2022-08-07: qty 250

## 2022-08-07 NOTE — Progress Notes (Signed)
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  INTERVAL HISTORY: Patient returns to clinic today for further evaluation and continuation of maintenance durvalumab.  He is tolerating his treatments well without significant side effects.  He has no neurologic complaints.  He denies any recent fevers or illnesses.  He has a good appetite and denies weight loss.  He has no chest pain, shortness of breath, cough, or hemoptysis.  He denies any nausea, vomiting, constipation, or diarrhea.  He has no urinary complaints.  Patient offers no specific complaints today.  REVIEW OF SYSTEMS:   Review of Systems  Constitutional: Negative.  Negative for fever, malaise/fatigue and weight loss.  Respiratory: Negative.  Negative for cough, hemoptysis and shortness of breath.   Cardiovascular: Negative.  Negative for chest pain and leg swelling.  Gastrointestinal: Negative.  Negative for abdominal pain.  Genitourinary: Negative.  Negative for dysuria.  Musculoskeletal: Negative.  Negative for back pain.  Skin: Negative.  Negative for rash.  Neurological: Negative.  Negative for dizziness, focal weakness, weakness and headaches.  Psychiatric/Behavioral: Negative.  The patient is not nervous/anxious.     As per HPI. Otherwise, a complete review of systems is negative.  PAST MEDICAL HISTORY: Past Medical History:  Diagnosis Date   COPD (chronic obstructive pulmonary disease) ([place])    Coronary artery disease    1 stent placed in about 2008   Diabetes mellitus without complication (HCC)    Dyspnea    Headache    migraines   Hyperlipidemia    Hypertension    Pneumonia  12/2021   hospitalized at [facility]: Past Surgical History:  Procedure Laterality Date   CARDIAC CATHETERIZATION     COLONOSCOPY     IR IMAGING GUIDED PORT INSERTION  02/12/2022   VIDEO BRONCHOSCOPY WITH ENDOBRONCHIAL NAVIGATION N/A 01/23/2022   Procedure: VIDEO BRONCHOSCOPY WITH ENDOBRONCHIAL NAVIGATION;  Surgeon: Ottie Glazier, MD;  Location: [facility] ORS;  Service: Thoracic;  Laterality: N/A;   VIDEO BRONCHOSCOPY WITH ENDOBRONCHIAL ULTRASOUND N/A 01/23/2022   Procedure: VIDEO BRONCHOSCOPY WITH ENDOBRONCHIAL ULTRASOUND;  Surgeon: Ottie Glazier, MD;  Location: [facility] ORS;  Service: Thoracic;  Laterality: N/A;    FAMILY HISTORY: Family History  Problem Relation Age of Onset   Throat cancer Brother     ADVANCED DIRECTIVES (Y/N):  N  HEALTH MAINTENANCE: Social History   Tobacco Use   Smoking status: Former    Packs/day: 1.00    Years: 20.00    Total pack years: 20.00    Types: Cigarettes    Quit date: 02/11/2022    Years since quitting: 0.4   Smokeless tobacco: Never   Tobacco comments:    Request to MD for Nicotine patch to help him quit smoking.  Vaping Use   Vaping Use: Never used  Substance Use Topics   Alcohol use: Not Currently   Drug use: Never     Colonoscopy:  PAP:  Bone density:  Lipid panel:  Allergies  Allergen Reactions   Paclitaxel Shortness Of Breath, Swelling, Other (See Comments), Cough and Hypertension    Chest pain and tightness    Current Outpatient Medications  Medication Sig Dispense Refill   albuterol (PROVENTIL) (5 MG/ML) 0.5% nebulizer  solution Take 2.5 mg by nebulization every morning.     albuterol (VENTOLIN HFA) 108 (90 Base) MCG/ACT inhaler Inhale 2 puffs into the lungs every 4 (four) hours as needed for wheezing or shortness of breath.     aspirin EC 81 MG tablet Take 81 mg by mouth daily.     atorvastatin (LIPITOR) 80 MG tablet Take 1 tablet (80 mg total) by mouth every morning. 90 tablet 1   benzonatate  (TESSALON) 100 MG capsule Take 1 capsule (100 mg total) by mouth 3 (three) times daily as needed for cough. 45 capsule 1   blood glucose meter kit and supplies KIT Dispense based on patient and insurance preference. Use up to four times daily as directed. 1 each 0   Cholecalciferol (VITAMIN D3 PO) Take 1 tablet by mouth daily at 6 (six) AM.     hydrocortisone 2.5 % ointment Apply topically 2 (two) times daily. Apply to skin rash twice a day as needed for itch. 30 g 0   lidocaine-prilocaine (EMLA) cream Apply on the port. 30 -45 min  prior to port access. 30 g 3   losartan (COZAAR) 100 MG tablet Take 100 mg by mouth every evening.     metFORMIN (GLUCOPHAGE) 1000 MG tablet Take 1,000 mg by mouth daily with breakfast.     metoprolol succinate (TOPROL XL) 25 MG 24 hr tablet Take 1 tablet (25 mg total) by mouth daily. 30 tablet 3   OXYGEN Inhale 2 L into the lungs at bedtime.     Nicotine 21-14-7 MG/24HR KIT Place 1 patch onto the skin daily. (Patient not taking: Reported on 08/07/2022) 60 kit 1   ondansetron (ZOFRAN) 8 MG tablet One pill every 8 hours as needed for nausea/vomitting. (Patient not taking: Reported on 06/19/2022) 40 tablet 1   prochlorperazine (COMPAZINE) 10 MG tablet TAKE 1 TABLET(10 MG) BY MOUTH EVERY 6 HOURS AS NEEDED FOR NAUSEA OR VOMITING (Patient not taking: Reported on 06/19/2022) 40 tablet 1   traZODone (DESYREL) 50 MG tablet Take 50 mg by mouth at bedtime. (Patient not taking: Reported on 06/26/2022)     No current facility-administered medications for this visit.   Facility-Administered Medications Ordered in Other Visits  Medication Dose Route Frequency Provider Last Rate Last Admin   heparin lock flush 100 UNIT/ML injection            heparin lock flush 100 UNIT/ML injection            heparin lock flush 100 unit/mL  500 Units Intracatheter Once PRN Lloyd Huger, MD        OBJECTIVE: Vitals:   08/07/22 0909  BP: 134/67  Pulse: 73  Resp: 16  Temp: (!) 97.3 F  (36.3 C)  SpO2: 96%     Body mass index is 31.9 kg/m.    ECOG FS:0 - Asymptomatic  General: Well-developed, well-nourished, no acute distress. Eyes: Pink conjunctiva, anicteric sclera. HEENT: Normocephalic, moist mucous membranes. Lungs: No audible wheezing or coughing. Heart: Regular rate and rhythm. Abdomen: Soft, nontender, no obvious distention. Musculoskeletal: No edema, cyanosis, or clubbing. Neuro: Alert, answering all questions appropriately. Cranial nerves grossly intact. Skin: No rashes or petechiae noted. Psych: Normal affect.  LAB RESULTS:  Lab Results  Component Value Date   NA 137 08/07/2022   K 3.9 08/07/2022   CL 105 08/07/2022   CO2 26 08/07/2022   GLUCOSE 111 (H) 08/07/2022   BUN 11 08/07/2022   CREATININE 0.97 08/07/2022   CALCIUM 8.7 (L)  08/07/2022   PROT 7.0 08/07/2022   ALBUMIN 3.9 08/07/2022   AST 20 08/07/2022   ALT 16 08/07/2022   ALKPHOS 89 08/07/2022   BILITOT 0.3 08/07/2022   GFRNONAA >60 08/07/2022   GFRAA >60 07/21/2017    Lab Results  Component Value Date   WBC 4.0 08/07/2022   NEUTROABS 2.5 08/07/2022   HGB 14.2 08/07/2022   HCT 44.9 08/07/2022   MCV 98.7 08/07/2022   PLT 158 08/07/2022     STUDIES: No results found.  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  Although did note a new ill-defined 1 cm lesion unclear if benign or malignant.  Plan to repeat imaging in January.  Proceed with cycle 4 of maintenance durvalumab.  Return to clinic in 2 weeks for further evaluation and consideration of cycle 5.  I spent a total of 30 minutes reviewing chart data, face-to-face evaluation with the patient, counseling and coordination of care as detailed above.   Patient expressed understanding and was in agreement with this plan. He also  understands that He can call clinic at any time with any questions, concerns, or complaints.    Cancer Staging  Malignant neoplasm of upper lobe of right lung ([place]) Staging form: Lung, AJCC 8th Edition - Clinical: Stage IIIB (cT2a, cN3, cM0) - Signed by Cammie Sickle, MD on 02/01/2022   Lloyd Huger, MD   08/07/2022 11:33 AM

## 2022-08-07 NOTE — Patient Instructions (Signed)
[facility] CANCER CTR AT [place]  Discharge Instructions: Thank you for choosing [facility] to provide your oncology and hematology care.  If you have a lab appointment with the [facility], please go directly to the [facility] and check in at the registration area.  Wear comfortable clothing and clothing appropriate for easy access to any Portacath or PICC line.   We strive to give you quality time with your provider. You may need to reschedule your appointment if you arrive late (15 or more minutes).  Arriving late affects you and other patients whose appointments are after yours.  Also, if you miss three or more appointments without notifying the office, you may be dismissed from the clinic at the provider's discretion.      For prescription refill requests, have your pharmacy contact our office and allow 72 hours for refills to be completed.    Today you received the following chemotherapy and/or immunotherapy agents- Imfinzi     To help prevent nausea and vomiting after your treatment, we encourage you to take your nausea medication as directed.  BELOW ARE SYMPTOMS THAT SHOULD BE REPORTED IMMEDIATELY: *FEVER GREATER THAN 100.4 F (38 C) OR HIGHER *CHILLS OR SWEATING *NAUSEA AND VOMITING THAT IS NOT CONTROLLED WITH YOUR NAUSEA MEDICATION *UNUSUAL SHORTNESS OF BREATH *UNUSUAL BRUISING OR BLEEDING *URINARY PROBLEMS (pain or burning when urinating, or frequent urination) *BOWEL PROBLEMS (unusual diarrhea, constipation, pain near the anus) TENDERNESS IN MOUTH AND THROAT WITH OR WITHOUT PRESENCE OF ULCERS (sore throat, sores in mouth, or a toothache) UNUSUAL RASH, SWELLING OR PAIN  UNUSUAL VAGINAL DISCHARGE OR ITCHING   Items with * indicate a potential emergency and should be followed up as soon as possible or go to the Emergency Department if any problems should occur.  Please show the CHEMOTHERAPY ALERT CARD or IMMUNOTHERAPY ALERT CARD at check-in to the  Emergency Department and triage nurse.  Should you have questions after your visit or need to cancel or reschedule your appointment, please contact [facility] CANCER [place] AT [place]  [phone] and follow the prompts.  Office hours are 8:00 a.m. to 4:30 p.m. Monday - Friday. Please note that voicemails left after 4:00 p.m. may not be returned until the following business day.  We are closed weekends and major holidays. You have access to a nurse at all times for urgent questions. Please call the main number to the clinic [phone] and follow the prompts.  For any non-urgent questions, you may also contact your provider using MyChart. We now offer e-Visits for anyone 30 and older to request care online for non-urgent symptoms. For details visit [email or web address].   Also download the MyChart app! Go to the app store, search "MyChart", open the app, select [place], and log in with your MyChart username and password.  Masks are optional in the cancer centers. If you would like for your care team to wear a mask while they are taking care of you, please let them know. For doctor visits, patients may have with them one support person who is at least 71 years old. At this time, visitors are not allowed in the infusion area.

## 2022-08-21 ENCOUNTER — Encounter: Payer: Self-pay | Admitting: Oncology

## 2022-08-21 ENCOUNTER — Inpatient Hospital Stay: Payer: Medicare Other | Attending: Internal Medicine

## 2022-08-21 ENCOUNTER — Inpatient Hospital Stay: Payer: Medicare Other

## 2022-08-21 ENCOUNTER — Inpatient Hospital Stay (HOSPITAL_BASED_OUTPATIENT_CLINIC_OR_DEPARTMENT_OTHER): Payer: Medicare Other | Admitting: Oncology

## 2022-08-21 VITALS — BP 164/73 | HR 57 | Temp 96.7°F

## 2022-08-21 DIAGNOSIS — Z5112 Encounter for antineoplastic immunotherapy: Secondary | ICD-10-CM | POA: Insufficient documentation

## 2022-08-21 DIAGNOSIS — C3411 Malignant neoplasm of upper lobe, right bronchus or lung: Secondary | ICD-10-CM | POA: Insufficient documentation

## 2022-08-21 DIAGNOSIS — C3402 Malignant neoplasm of left main bronchus: Secondary | ICD-10-CM | POA: Insufficient documentation

## 2022-08-21 DIAGNOSIS — Z79899 Other long term (current) drug therapy: Secondary | ICD-10-CM | POA: Diagnosis not present

## 2022-08-21 LAB — CBC WITH DIFFERENTIAL/PLATELET
Abs Immature Granulocytes: 0.01 10*3/uL (ref 0.00–0.07)
Basophils Absolute: 0 10*3/uL (ref 0.0–0.1)
Basophils Relative: 1 %
Eosinophils Absolute: 0.1 10*3/uL (ref 0.0–0.5)
Eosinophils Relative: 3 %
HCT: 44.4 % (ref 39.0–52.0)
Hemoglobin: 14.1 g/dL (ref 13.0–17.0)
Immature Granulocytes: 0 %
Lymphocytes Relative: 20 %
Lymphs Abs: 0.8 10*3/uL (ref 0.7–4.0)
MCH: 30.7 pg (ref 26.0–34.0)
MCHC: 31.8 g/dL (ref 30.0–36.0)
MCV: 96.7 fL (ref 80.0–100.0)
Monocytes Absolute: 0.6 10*3/uL (ref 0.1–1.0)
Monocytes Relative: 15 %
Neutro Abs: 2.5 10*3/uL (ref 1.7–7.7)
Neutrophils Relative %: 61 %
Platelets: 159 10*3/uL (ref 150–400)
RBC: 4.59 MIL/uL (ref 4.22–5.81)
RDW: 12.2 % (ref 11.5–15.5)
WBC: 4.1 10*3/uL (ref 4.0–10.5)
nRBC: 0 % (ref 0.0–0.2)

## 2022-08-21 LAB — COMPREHENSIVE METABOLIC PANEL
ALT: 16 U/L (ref 0–44)
AST: 16 U/L (ref 15–41)
Albumin: 3.9 g/dL (ref 3.5–5.0)
Alkaline Phosphatase: 77 U/L (ref 38–126)
Anion gap: 8 (ref 5–15)
BUN: 11 mg/dL (ref 8–23)
CO2: 26 mmol/L (ref 22–32)
Calcium: 8.7 mg/dL — ABNORMAL LOW (ref 8.9–10.3)
Chloride: 104 mmol/L (ref 98–111)
Creatinine, Ser: 0.96 mg/dL (ref 0.61–1.24)
GFR, Estimated: >60 mL/min (ref >60)
Glucose, Bld: 98 mg/dL (ref 70–99)
Potassium: 4 mmol/L (ref 3.5–5.1)
Sodium: 138 mmol/L (ref 135–145)
Total Bilirubin: 0.6 mg/dL (ref 0.3–1.2)
Total Protein: 7.1 g/dL (ref 6.5–8.1)

## 2022-08-21 MED ORDER — SODIUM CHLORIDE 0.9 % IV SOLN
Freq: Once | INTRAVENOUS | Status: AC
  Filled 2022-08-21: qty 250

## 2022-08-21 MED ORDER — HEPARIN SOD (PORK) LOCK FLUSH 100 UNIT/ML IV SOLN
500.0000 [IU] | Freq: Once | INTRAVENOUS | Status: AC | PRN
  Administered 2022-08-21: 500 [IU]
  Filled 2022-08-21: qty 5

## 2022-08-21 MED ORDER — SODIUM CHLORIDE 0.9 % IV SOLN
10.0000 mg/kg | Freq: Once | INTRAVENOUS | Status: AC
  Administered 2022-08-21: 1000 mg via INTRAVENOUS
  Filled 2022-08-21: qty 20

## 2022-08-21 NOTE — Progress Notes (Signed)
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  INTERVAL HISTORY: Patient returns to clinic today for further evaluation and continuation of maintenance durvalumab.  He currently feels well and is asymptomatic.  He is tolerating his treatments without significant side effects.  He has no neurologic complaints.  He denies any recent fevers or illnesses.  He has a good appetite and denies weight loss.  He has no chest pain, shortness of breath, cough, or hemoptysis.  He denies any nausea, vomiting, constipation, or diarrhea.  He has no urinary complaints.  Patient offers no specific complaints today.    REVIEW OF SYSTEMS:   Review of Systems  Constitutional: Negative.  Negative for fever, malaise/fatigue and weight loss.  Respiratory: Negative.  Negative for cough, hemoptysis and shortness of breath.   Cardiovascular: Negative.  Negative for chest pain and leg swelling.  Gastrointestinal: Negative.  Negative for abdominal pain.  Genitourinary: Negative.  Negative for dysuria.  Musculoskeletal: Negative.  Negative for back pain.  Skin: Negative.  Negative for rash.  Neurological: Negative.  Negative for dizziness, focal weakness, weakness and headaches.  Psychiatric/Behavioral: Negative.  The patient is not nervous/anxious.     As per HPI. Otherwise, a complete review of systems is negative.  PAST MEDICAL HISTORY: Past Medical History:  Diagnosis Date   COPD (chronic obstructive pulmonary disease) ([place])    Coronary artery disease    1 stent placed in about 2008   Diabetes mellitus without complication (HCC)    Dyspnea    Headache    migraines    Hyperlipidemia    Hypertension    Pneumonia 12/2021   hospitalized at [facility]: Past Surgical History:  Procedure Laterality Date   CARDIAC CATHETERIZATION     COLONOSCOPY     IR IMAGING GUIDED PORT INSERTION  02/12/2022   VIDEO BRONCHOSCOPY WITH ENDOBRONCHIAL NAVIGATION N/A 01/23/2022   Procedure: VIDEO BRONCHOSCOPY WITH ENDOBRONCHIAL NAVIGATION;  Surgeon: Ottie Glazier, MD;  Location: [facility] ORS;  Service: Thoracic;  Laterality: N/A;   VIDEO BRONCHOSCOPY WITH ENDOBRONCHIAL ULTRASOUND N/A 01/23/2022   Procedure: VIDEO BRONCHOSCOPY WITH ENDOBRONCHIAL ULTRASOUND;  Surgeon: Ottie Glazier, MD;  Location: [facility] ORS;  Service: Thoracic;  Laterality: N/A;    FAMILY HISTORY: Family History  Problem Relation Age of Onset   Throat cancer Brother     ADVANCED DIRECTIVES (Y/N):  N  HEALTH MAINTENANCE: Social History   Tobacco Use   Smoking status: Former    Packs/day: 1.00    Years: 20.00    Total pack years: 20.00    Types: Cigarettes    Quit date: 02/11/2022    Years since quitting: 0.5   Smokeless tobacco: Never   Tobacco comments:    Request to MD for Nicotine patch to help him quit smoking.  Vaping Use   Vaping Use: Never used  Substance Use Topics   Alcohol use: Not Currently   Drug use: Never     Colonoscopy:  PAP:  Bone density:  Lipid panel:  Allergies  Allergen Reactions   Paclitaxel Shortness Of Breath, Swelling, Other (See Comments), Cough and Hypertension    Chest pain and tightness    Current Outpatient Medications  Medication Sig Dispense  Refill   albuterol (PROVENTIL) (5 MG/ML) 0.5% nebulizer solution Take 2.5 mg by nebulization every morning.     albuterol (VENTOLIN HFA) 108 (90 Base) MCG/ACT inhaler Inhale 2 puffs into the lungs every 4 (four) hours as needed for wheezing or shortness of breath.     aspirin EC 81 MG tablet Take 81 mg by mouth daily.     atorvastatin (LIPITOR) 80 MG tablet Take 1 tablet (80 mg total) by mouth  every morning. 90 tablet 1   benzonatate (TESSALON) 100 MG capsule Take 1 capsule (100 mg total) by mouth 3 (three) times daily as needed for cough. 45 capsule 1   blood glucose meter kit and supplies KIT Dispense based on patient and insurance preference. Use up to four times daily as directed. 1 each 0   Cholecalciferol (VITAMIN D3 PO) Take 1 tablet by mouth daily at 6 (six) AM.     hydrocortisone 2.5 % ointment Apply topically 2 (two) times daily. Apply to skin rash twice a day as needed for itch. 30 g 0   lidocaine-prilocaine (EMLA) cream Apply on the port. 30 -45 min  prior to port access. 30 g 3   losartan (COZAAR) 100 MG tablet Take 100 mg by mouth every evening.     metFORMIN (GLUCOPHAGE) 1000 MG tablet Take 1,000 mg by mouth daily with breakfast.     metoprolol succinate (TOPROL XL) 25 MG 24 hr tablet Take 1 tablet (25 mg total) by mouth daily. 30 tablet 3   OXYGEN Inhale 2 L into the lungs at bedtime.     Nicotine 21-14-7 MG/24HR KIT Place 1 patch onto the skin daily. (Patient not taking: Reported on 08/07/2022) 60 kit 1   ondansetron (ZOFRAN) 8 MG tablet One pill every 8 hours as needed for nausea/vomitting. (Patient not taking: Reported on 06/19/2022) 40 tablet 1   prochlorperazine (COMPAZINE) 10 MG tablet TAKE 1 TABLET(10 MG) BY MOUTH EVERY 6 HOURS AS NEEDED FOR NAUSEA OR VOMITING (Patient not taking: Reported on 06/19/2022) 40 tablet 1   traZODone (DESYREL) 50 MG tablet Take 50 mg by mouth at bedtime. (Patient not taking: Reported on 06/26/2022)     No current facility-administered medications for this visit.   Facility-Administered Medications Ordered in Other Visits  Medication Dose Route Frequency Provider Last Rate Last Admin   heparin lock flush 100 UNIT/ML injection            heparin lock flush 100 UNIT/ML injection             OBJECTIVE: Vitals:   08/21/22 0939  BP: (!) 164/73  Pulse: (!) 57  Temp: (!) 96.7 F (35.9 C)  SpO2: 95%     There is no height or weight on  file to calculate BMI.    ECOG FS:0 - Asymptomatic  General: Well-developed, well-nourished, no acute distress. Eyes: Pink conjunctiva, anicteric sclera. HEENT: Normocephalic, moist mucous membranes. Lungs: No audible wheezing or coughing. Heart: Regular rate and rhythm. Abdomen: Soft, nontender, no obvious distention. Musculoskeletal: No edema, cyanosis, or clubbing. Neuro: Alert, answering all questions appropriately. Cranial nerves grossly intact. Skin: No rashes or petechiae noted. Psych: Normal affect.  LAB RESULTS:  Lab Results  Component Value Date   NA 138 08/21/2022   K 4.0 08/21/2022   CL 104 08/21/2022   CO2 26 08/21/2022   GLUCOSE 98 08/21/2022   BUN 11 08/21/2022   CREATININE 0.96 08/21/2022   CALCIUM 8.7 (L) 08/21/2022   PROT 7.1 08/21/2022   ALBUMIN  3.9 08/21/2022   AST 16 08/21/2022   ALT 16 08/21/2022   ALKPHOS 77 08/21/2022   BILITOT 0.6 08/21/2022   GFRNONAA >60 08/21/2022   GFRAA >60 07/21/2017    Lab Results  Component Value Date   WBC 4.1 08/21/2022   NEUTROABS 2.5 08/21/2022   HGB 14.1 08/21/2022   HCT 44.4 08/21/2022   MCV 96.7 08/21/2022   PLT 159 08/21/2022     STUDIES: No results found.  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  Although did note a new ill-defined 1 cm lesion unclear if benign or malignant.  Plan to repeat imaging in January.  Proceed with cycle 5 of maintenance durvalumab today.  Return to clinic in 2 weeks for further evaluation and consideration of cycle 6.     I spent a total of 30 minutes reviewing chart data, face-to-face evaluation with the patient, counseling and coordination of care as detailed above.  Patient expressed understanding and was in agreement with this plan. He also understands that He can call clinic at  any time with any questions, concerns, or complaints.    Cancer Staging  Malignant neoplasm of upper lobe of right lung ([place]) Staging form: Lung, AJCC 8th Edition - Clinical: Stage IIIB (cT2a, cN3, cM0) - Signed by Cammie Sickle, MD on 02/01/2022   Louis Huger, MD   08/21/2022 2:59 PM

## 2022-08-21 NOTE — Patient Instructions (Signed)
[facility] AT [place]  Discharge Instructions: Thank you for choosing [facility] to provide your oncology and hematology care.  If you have a lab appointment with the [facility], please go directly to the [facility] and check in at the registration area.  Wear comfortable clothing and clothing appropriate for easy access to any Portacath or PICC line.   We strive to give you quality time with your provider. You may need to reschedule your appointment if you arrive late (15 or more minutes).  Arriving late affects you and other patients whose appointments are after yours.  Also, if you miss three or more appointments without notifying the office, you may be dismissed from the clinic at the provider's discretion.      For prescription refill requests, have your pharmacy contact our office and allow 72 hours for refills to be completed.    Today you received the following chemotherapy and/or immunotherapy agents Imfinzi        To help prevent nausea and vomiting after your treatment, we encourage you to take your nausea medication as directed.  BELOW ARE SYMPTOMS THAT SHOULD BE REPORTED IMMEDIATELY: *FEVER GREATER THAN 100.4 F (38 C) OR HIGHER *CHILLS OR SWEATING *NAUSEA AND VOMITING THAT IS NOT CONTROLLED WITH YOUR NAUSEA MEDICATION *UNUSUAL SHORTNESS OF BREATH *UNUSUAL BRUISING OR BLEEDING *URINARY PROBLEMS (pain or burning when urinating, or frequent urination) *BOWEL PROBLEMS (unusual diarrhea, constipation, pain near the anus) TENDERNESS IN MOUTH AND THROAT WITH OR WITHOUT PRESENCE OF ULCERS (sore throat, sores in mouth, or a toothache) UNUSUAL RASH, SWELLING OR PAIN  UNUSUAL VAGINAL DISCHARGE OR ITCHING   Items with * indicate a potential emergency and should be followed up as soon as possible or go to the Emergency Department if any problems should occur.  Please show the CHEMOTHERAPY ALERT CARD or IMMUNOTHERAPY ALERT CARD at check-in to  the Emergency Department and triage nurse.  Should you have questions after your visit or need to cancel or reschedule your appointment, please contact [facility] CANCER [place] AT [place]  [phone] and follow the prompts.  Office hours are 8:00 a.m. to 4:30 p.m. Monday - Friday. Please note that voicemails left after 4:00 p.m. may not be returned until the following business day.  We are closed weekends and major holidays. You have access to a nurse at all times for urgent questions. Please call the main number to the clinic [phone] and follow the prompts.  For any non-urgent questions, you may also contact your provider using MyChart. We now offer e-Visits for anyone 70 and older to request care online for non-urgent symptoms. For details visit [email or web address].   Also download the MyChart app! Go to the app store, search "MyChart", open the app, select [place], and log in with your MyChart username and password.  Masks are optional in the cancer centers. If you would like for your care team to wear a mask while they are taking care of you, please let them know. For doctor visits, patients may have with them one support person who is at least 71 years old. At this time, visitors are not allowed in the infusion area.

## 2022-08-21 NOTE — Progress Notes (Signed)
Pt doing fairly well. Occ vertigo. Has a cough, taking tessalon for it. Intermittent pain Left chest . Appetite is good.

## 2022-09-03 ENCOUNTER — Inpatient Hospital Stay (HOSPITAL_BASED_OUTPATIENT_CLINIC_OR_DEPARTMENT_OTHER): Payer: Medicare Other | Admitting: Nurse Practitioner

## 2022-09-03 ENCOUNTER — Inpatient Hospital Stay: Payer: Medicare Other

## 2022-09-03 ENCOUNTER — Encounter: Payer: Self-pay | Admitting: Nurse Practitioner

## 2022-09-03 ENCOUNTER — Encounter: Payer: Self-pay | Admitting: Internal Medicine

## 2022-09-03 VITALS — BP 146/93 | HR 75 | Temp 97.3°F | Wt 214.0 lb

## 2022-09-03 DIAGNOSIS — C3411 Malignant neoplasm of upper lobe, right bronchus or lung: Secondary | ICD-10-CM

## 2022-09-03 DIAGNOSIS — Z5112 Encounter for antineoplastic immunotherapy: Secondary | ICD-10-CM | POA: Diagnosis not present

## 2022-09-03 LAB — COMPREHENSIVE METABOLIC PANEL
ALT: 19 U/L (ref 0–44)
AST: 21 U/L (ref 15–41)
Albumin: 3.9 g/dL (ref 3.5–5.0)
Alkaline Phosphatase: 87 U/L (ref 38–126)
Anion gap: 9 (ref 5–15)
BUN: 10 mg/dL (ref 8–23)
CO2: 27 mmol/L (ref 22–32)
Calcium: 8.8 mg/dL — ABNORMAL LOW (ref 8.9–10.3)
Chloride: 104 mmol/L (ref 98–111)
Creatinine, Ser: 0.95 mg/dL (ref 0.61–1.24)
GFR, Estimated: >60 mL/min (ref >60)
Glucose, Bld: 102 mg/dL — ABNORMAL HIGH (ref 70–99)
Potassium: 4.1 mmol/L (ref 3.5–5.1)
Sodium: 140 mmol/L (ref 135–145)
Total Bilirubin: 0.3 mg/dL (ref 0.3–1.2)
Total Protein: 7.4 g/dL (ref 6.5–8.1)

## 2022-09-03 LAB — CBC WITH DIFFERENTIAL/PLATELET
Abs Immature Granulocytes: 0.01 10*3/uL (ref 0.00–0.07)
Basophils Absolute: 0 10*3/uL (ref 0.0–0.1)
Basophils Relative: 1 %
Eosinophils Absolute: 0.2 10*3/uL (ref 0.0–0.5)
Eosinophils Relative: 4 %
HCT: 44.9 % (ref 39.0–52.0)
Hemoglobin: 14.1 g/dL (ref 13.0–17.0)
Immature Granulocytes: 0 %
Lymphocytes Relative: 19 %
Lymphs Abs: 0.7 10*3/uL (ref 0.7–4.0)
MCH: 29.8 pg (ref 26.0–34.0)
MCHC: 31.4 g/dL (ref 30.0–36.0)
MCV: 94.9 fL (ref 80.0–100.0)
Monocytes Absolute: 0.5 10*3/uL (ref 0.1–1.0)
Monocytes Relative: 15 %
Neutro Abs: 2.2 10*3/uL (ref 1.7–7.7)
Neutrophils Relative %: 61 %
Platelets: 177 10*3/uL (ref 150–400)
RBC: 4.73 MIL/uL (ref 4.22–5.81)
RDW: 12.2 % (ref 11.5–15.5)
WBC: 3.6 10*3/uL — ABNORMAL LOW (ref 4.0–10.5)
nRBC: 0 % (ref 0.0–0.2)

## 2022-09-03 LAB — TSH: TSH: 0.281 u[IU]/mL — ABNORMAL LOW (ref 0.350–4.500)

## 2022-09-03 MED ORDER — HEPARIN SOD (PORK) LOCK FLUSH 100 UNIT/ML IV SOLN
500.0000 [IU] | Freq: Once | INTRAVENOUS | Status: AC | PRN
  Administered 2022-09-03: 500 [IU]
  Filled 2022-09-03: qty 5

## 2022-09-03 MED ORDER — SODIUM CHLORIDE 0.9 % IV SOLN
Freq: Once | INTRAVENOUS | Status: AC
  Filled 2022-09-03: qty 250

## 2022-09-03 MED ORDER — SODIUM CHLORIDE 0.9% FLUSH
10.0000 mL | INTRAVENOUS | Status: DC | PRN
  Administered 2022-09-03: 10 mL via INTRAVENOUS
  Filled 2022-09-03: qty 10

## 2022-09-03 MED ORDER — SODIUM CHLORIDE 0.9 % IV SOLN
10.0000 mg/kg | Freq: Once | INTRAVENOUS | Status: AC
  Administered 2022-09-03: 1000 mg via INTRAVENOUS
  Filled 2022-09-03: qty 20

## 2022-09-03 NOTE — Progress Notes (Signed)
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  INTERVAL HISTORY: Patient returns to clinic today for further evaluation and continuation of maintenance durvalumab. Continues to feel at baseline. Has some shortness of breath with exertion which is unchanged. Tolerating durvalumab well without significant side effects. He denies neurologic complaints. No chest pain. No hemoptysis. No nausea, vomiting, constipation, or diarrhea. Denies urinary complaints. Denies other complaints. He's grieving loss of his sister. Was tempted to start smoking again but has not.   REVIEW OF SYSTEMS:   Review of Systems  Constitutional: Negative.  Negative for fever, malaise/fatigue and weight loss.  Respiratory: Negative.  Negative for cough, hemoptysis and shortness of breath.   Cardiovascular: Negative.  Negative for chest pain and leg swelling.  Gastrointestinal: Negative.  Negative for abdominal pain.  Genitourinary: Negative.  Negative for dysuria.  Musculoskeletal: Negative.  Negative for back pain.  Skin: Negative.  Negative for rash.  Neurological: Negative.  Negative for dizziness, focal weakness, weakness and headaches.  Psychiatric/Behavioral: Negative.  The patient is not nervous/anxious.   As per HPI. Otherwise, a complete review of systems is negative.  PAST MEDICAL HISTORY: Past Medical History:  Diagnosis Date   COPD (chronic obstructive pulmonary disease) ([place])    Coronary artery disease    1 stent placed in about 2008   Diabetes mellitus without complication (HCC)    Dyspnea    Headache    migraines   Hyperlipidemia    Hypertension    Pneumonia 12/2021    hospitalized at [place]: Past Surgical History:  Procedure Laterality Date   CARDIAC CATHETERIZATION     COLONOSCOPY     IR IMAGING GUIDED PORT INSERTION  02/12/2022   VIDEO BRONCHOSCOPY WITH ENDOBRONCHIAL NAVIGATION N/A 01/23/2022   Procedure: VIDEO BRONCHOSCOPY WITH ENDOBRONCHIAL NAVIGATION;  Surgeon: Ottie Glazier, MD;  Location: [facility] ORS;  Service: Thoracic;  Laterality: N/A;   VIDEO BRONCHOSCOPY WITH ENDOBRONCHIAL ULTRASOUND N/A 01/23/2022   Procedure: VIDEO BRONCHOSCOPY WITH ENDOBRONCHIAL ULTRASOUND;  Surgeon: Ottie Glazier, MD;  Location: [facility] ORS;  Service: Thoracic;  Laterality: N/A;    FAMILY HISTORY: Family History  Problem Relation Age of Onset   Throat cancer Brother    ADVANCED DIRECTIVES (Y/N):  N  HEALTH MAINTENANCE: Social History   Tobacco Use   Smoking status: Former    Packs/day: 1.00    Years: 20.00    Total pack years: 20.00    Types: Cigarettes    Quit date: 02/11/2022    Years since quitting: 0.5   Smokeless tobacco: Never   Tobacco comments:    Request to MD for Nicotine patch to help him quit smoking.  Vaping Use   Vaping Use: Never used  Substance Use Topics   Alcohol use: Not Currently   Drug use: Never    Colonoscopy:  PAP:  Bone density:  Lipid panel:  Allergies  Allergen Reactions   Paclitaxel Shortness Of Breath, Swelling, Other (See Comments), Cough and Hypertension    Chest pain and tightness    Current Outpatient Medications  Medication Sig Dispense Refill   albuterol (PROVENTIL) (5 MG/ML) 0.5% nebulizer solution Take 2.5 mg by nebulization every morning.     albuterol (VENTOLIN  HFA) 108 (90 Base) MCG/ACT inhaler Inhale 2 puffs into the lungs every 4 (four) hours as needed for wheezing or shortness of breath.     aspirin EC 81 MG tablet Take 81 mg by mouth daily.     atorvastatin (LIPITOR) 80 MG tablet Take 1 tablet (80 mg total) by mouth every morning. 90 tablet 1   benzonatate (TESSALON) 100 MG  capsule Take 1 capsule (100 mg total) by mouth 3 (three) times daily as needed for cough. 45 capsule 1   blood glucose meter kit and supplies KIT Dispense based on patient and insurance preference. Use up to four times daily as directed. 1 each 0   Cholecalciferol (VITAMIN D3 PO) Take 1 tablet by mouth daily at 6 (six) AM.     hydrocortisone 2.5 % ointment Apply topically 2 (two) times daily. Apply to skin rash twice a day as needed for itch. 30 g 0   lidocaine-prilocaine (EMLA) cream Apply on the port. 30 -45 min  prior to port access. 30 g 3   losartan (COZAAR) 100 MG tablet Take 100 mg by mouth every evening.     metFORMIN (GLUCOPHAGE) 1000 MG tablet Take 1,000 mg by mouth daily with breakfast.     metoprolol succinate (TOPROL XL) 25 MG 24 hr tablet Take 1 tablet (25 mg total) by mouth daily. 30 tablet 3   OXYGEN Inhale 2 L into the lungs at bedtime.     Nicotine 21-14-7 MG/24HR KIT Place 1 patch onto the skin daily. (Patient not taking: Reported on 08/07/2022) 60 kit 1   ondansetron (ZOFRAN) 8 MG tablet One pill every 8 hours as needed for nausea/vomitting. (Patient not taking: Reported on 06/19/2022) 40 tablet 1   prochlorperazine (COMPAZINE) 10 MG tablet TAKE 1 TABLET(10 MG) BY MOUTH EVERY 6 HOURS AS NEEDED FOR NAUSEA OR VOMITING (Patient not taking: Reported on 06/19/2022) 40 tablet 1   traZODone (DESYREL) 50 MG tablet Take 50 mg by mouth at bedtime. (Patient not taking: Reported on 06/26/2022)     No current facility-administered medications for this visit.   Facility-Administered Medications Ordered in Other Visits  Medication Dose Route Frequency Provider Last Rate Last Admin   heparin lock flush 100 UNIT/ML injection            heparin lock flush 100 UNIT/ML injection            sodium chloride flush (NS) 0.9 % injection 10 mL  10 mL Intravenous PRN Lloyd Huger, MD   10 mL at 09/03/22 0848    OBJECTIVE: Vitals:   09/03/22 0902  BP: (!) 146/93  Pulse: 75  Temp: (!) 97.3 F  (36.3 C)  SpO2: 96%     Body mass index is 31.6 kg/m.    ECOG FS:0 - Asymptomatic  General: Well-developed, well-nourished, no acute distress. Eyes: Pink conjunctiva, anicteric sclera. HEENT: Normocephalic, moist mucous membranes. Lungs: diminished bilaterally. Crackles bilaterally in upper and lower lobes. Heart: Regular rate and rhythm. Abdomen: Soft, nontender, no obvious distention. Musculoskeletal: No edema, cyanosis, or clubbing. Neuro: Alert, answering all questions appropriately. Cranial nerves grossly intact. Skin: No rashes or petechiae noted. Psych: Normal affect.  LAB RESULTS: Lab Results  Component Value Date   NA 138 08/21/2022   K 4.0 08/21/2022   CL 104 08/21/2022   CO2 26 08/21/2022   GLUCOSE 98 08/21/2022   BUN 11 08/21/2022   CREATININE 0.96 08/21/2022   CALCIUM 8.7 (L) 08/21/2022   PROT 7.1 08/21/2022  ALBUMIN 3.9 08/21/2022   AST 16 08/21/2022   ALT 16 08/21/2022   ALKPHOS 77 08/21/2022   BILITOT 0.6 08/21/2022   GFRNONAA >60 08/21/2022   GFRAA >60 07/21/2017    Lab Results  Component Value Date   WBC 3.6 (L) 09/03/2022   NEUTROABS 2.2 09/03/2022   HGB 14.1 09/03/2022   HCT 44.9 09/03/2022   MCV 94.9 09/03/2022   PLT 177 09/03/2022    STUDIES: No results found.  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  Although did note a new ill-defined 1 cm lesion, unclear if benign or malignant. Labs today reviewed and acceptable for continuation of treatment. Continues to feel at baseline and asymptomatic of progressive disease. Proceed with cycle 6 today then repeat restaging imaging in January. He will follow up with Dr Grayland Ormond for results and consideration of continuation of treatment. Will try to move up scheduled ct to be performed prior to next cycle.   Port-a-cath: functioning well.   Disposition: 2 weeks- lab, [place], durvalumab with ct prior- la  I spent a total of 30 minutes reviewing chart data, face-to-face evaluation with the patient, counseling and coordination of care as detailed above.  Patient expressed understanding and was in agreement with this plan. He also understands that He can call clinic at any time with any questions, concerns, or complaints.    Cancer Staging  Malignant neoplasm of upper lobe of right lung [facility]) Staging form: Lung, AJCC 8th Edition - Clinical: Stage IIIB (cT2a, cN3, cM0) - Signed by Cammie Sickle, MD on 02/01/2022   Verlon Au, NP   09/03/2022

## 2022-09-04 LAB — T4: T4, Total: 9.2 ug/dL (ref 4.5–12.0)

## 2022-09-05 IMAGING — MR MR HEAD WO/W CM
16 series · 48 of 48 positions shown · IV contrast (gadavist)
Comparison: 05/01/2011

CLINICAL DATA: Metastatic disease evaluation.

EXAM:
MRI HEAD WITHOUT AND WITH CONTRAST
TECHNIQUE: Multiplanar, multiecho pulse sequences of the brain and surrounding
structures were obtained without and with intravenous contrast.
CONTRAST:  9mL GADAVIST GADOBUTROL 1 MMOL/ML IV SOLN

[Series 5: ax dwi_tracew · axial · 3.0mm · 0.65mm/px · z∈[-111,+44]mm · 3 of 48 slices shown]
[im 1/48]
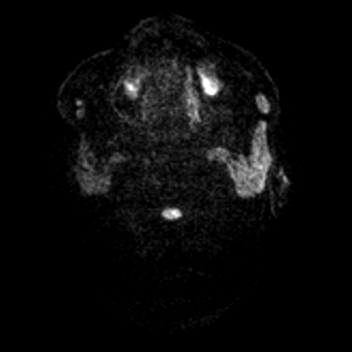
[im 24/48]
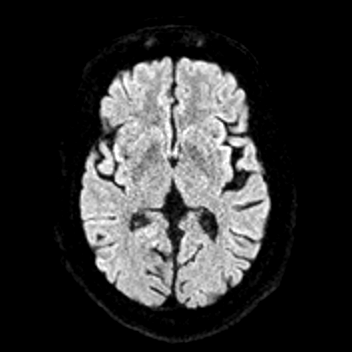
[im 48/48]
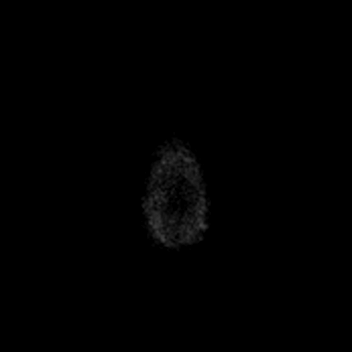

[Series 6: ax dwi_adc · axial · 3.0mm · 0.65mm/px · z∈[-111,+44]mm · 3 of 48 slices shown]
[im 1/48]
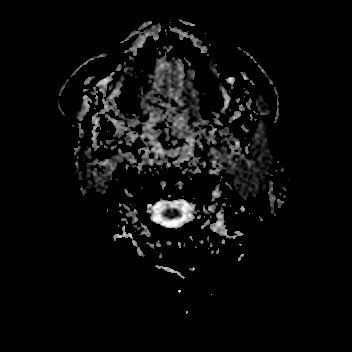
[im 24/48]
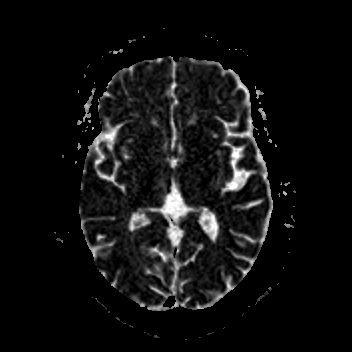
[im 48/48]
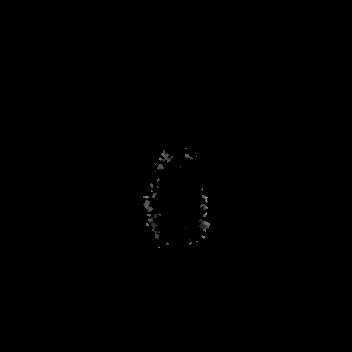

[Series 7: cor dwi_tracew · coronal · 5.0mm · 0.65mm/px · 2 of 40 slices shown]
[im 1/40]
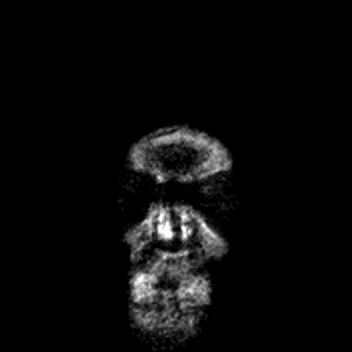
[im 40/40]
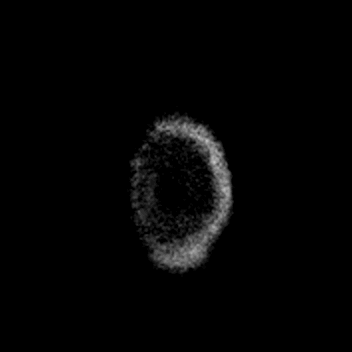

[Series 8: cor dwi_adc · coronal · 5.0mm · 0.65mm/px · 2 of 39 slices shown]
[im 1/39]
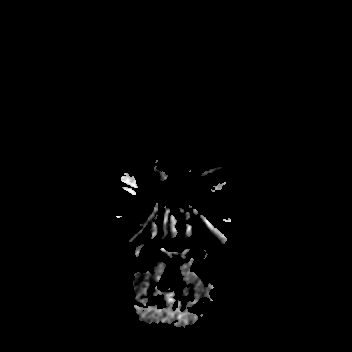
[im 39/39]
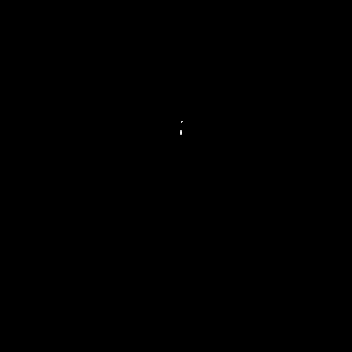

[Series 9: T1 · sagittal · 5.0mm · 0.62mm/px · 1 of 23 slices shown (1 of 3)]
[im 1/23]
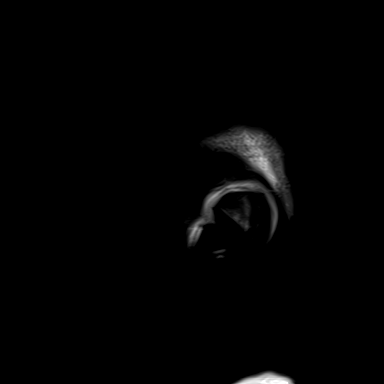

[Series 10: T2 · axial · 5.0mm · 0.53mm/px · 1 of 25 slices shown]
[im 1/25]
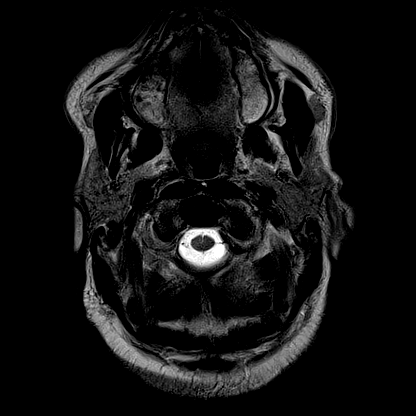

[Series 11: T1 · sagittal · 5.0mm · 0.94mm/px · 1 of 23 slices shown (2 of 3)]
[im 1/23]
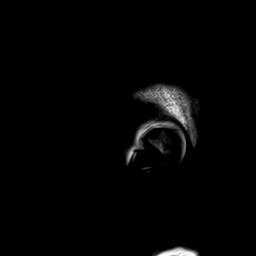

[Series 12: mag_images · axial · 3.0mm · 0.90mm/px · z∈[-121,+56]mm · 3 of 60 slices shown]
[im 1/60]
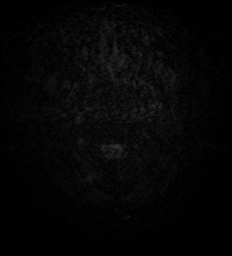
[im 30/60]
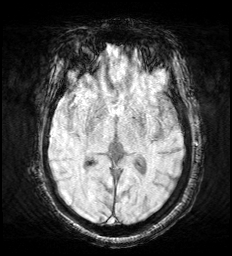
[im 60/60]
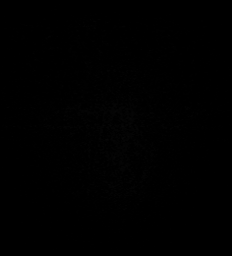

[Series 13: pha_images · axial · 3.0mm · 0.90mm/px · z∈[-118,+53]mm · 3 of 58 slices shown]
[im 1/58]
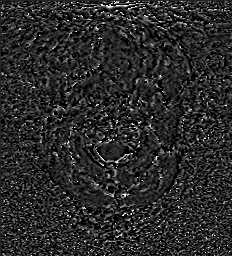
[im 29/58]
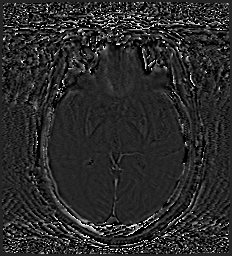
[im 58/58]
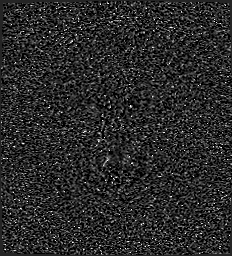

[Series 14: swi_images · axial · 3.0mm · 0.90mm/px · z∈[-121,+56]mm · 3 of 60 slices shown]
[im 1/60]
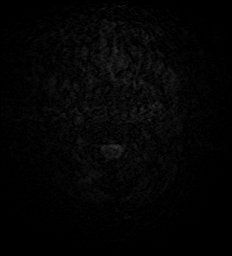
[im 30/60]
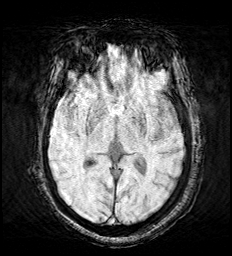
[im 60/60]
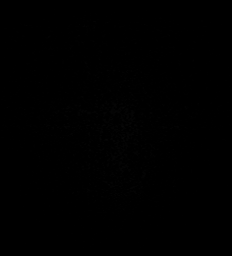

[Series 16: FLAIR · axial · 3.0mm · 0.53mm/px · z∈[-114,+48]mm · 3 of 55 slices shown]
[im 1/55]
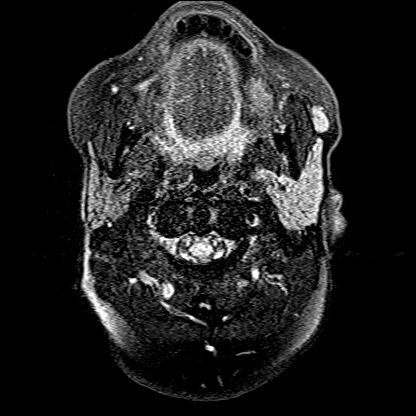
[im 28/55]
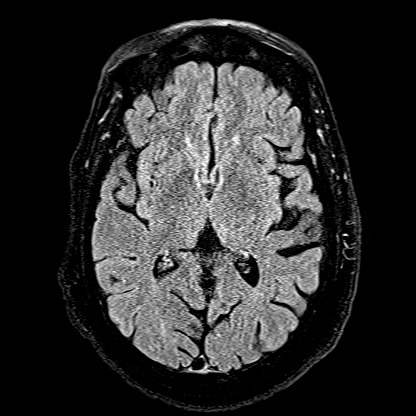
[im 55/55]
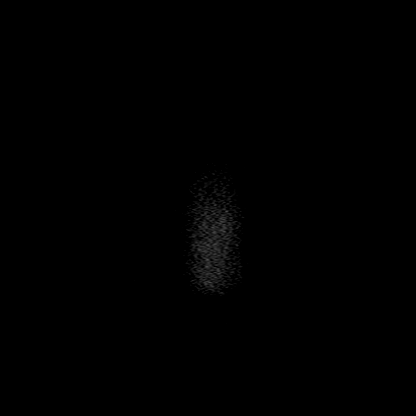

[Series 17: T1 · axial · 1.0mm · 0.98mm/px · z∈[-121,+54]mm · 9 of 176 slices shown (3 of 3)]
[im 1/176]
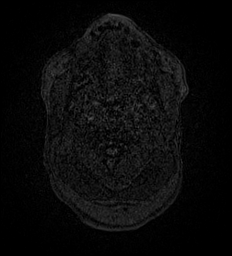
[im 22/176]
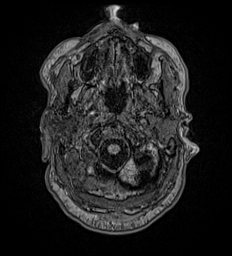
[im 44/176]
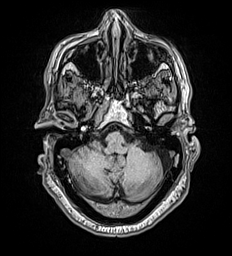
[im 66/176]
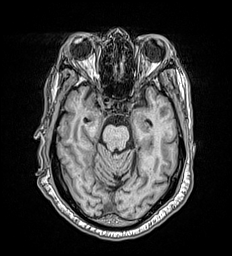
[im 88/176]
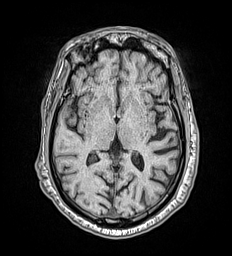
[im 110/176]
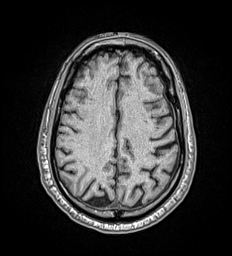
[im 132/176]
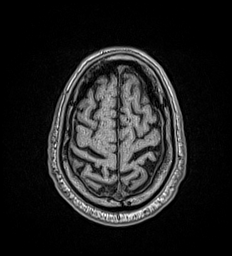
[im 154/176]
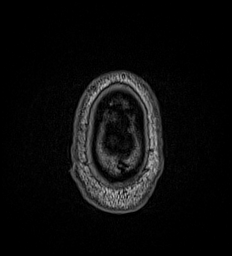
[im 176/176]
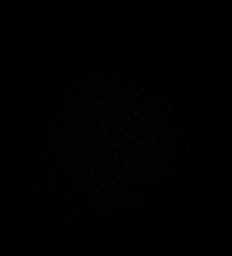

[Series 18: T2 post-contrast · coronal · 5.0mm · 0.57mm/px · 2 of 29 slices shown]
[im 1/29]
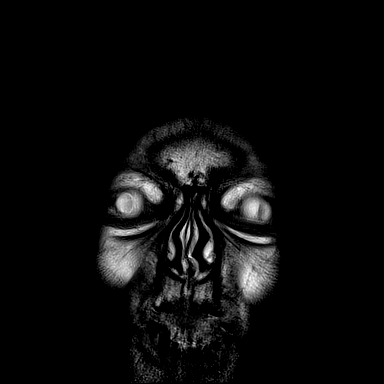
[im 29/29]
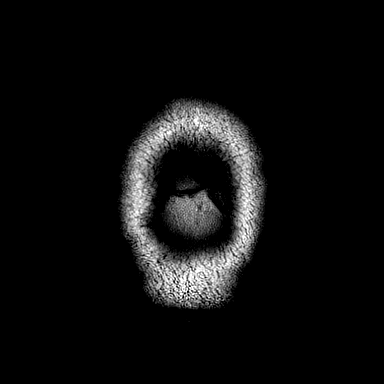

[Series 19: T1 post-contrast · axial · 1.0mm · 0.98mm/px · z∈[-121,+54]mm · 9 of 176 slices shown (1 of 3)]
[im 1/176]
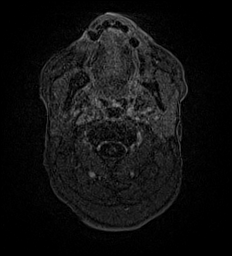
[im 22/176]
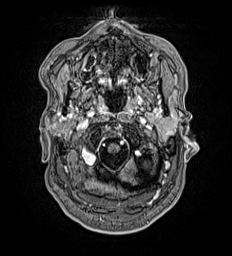
[im 44/176]
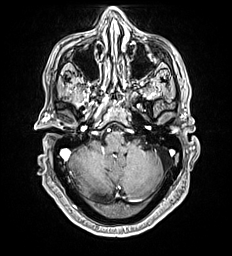
[im 66/176]
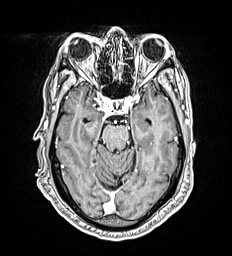
[im 88/176]
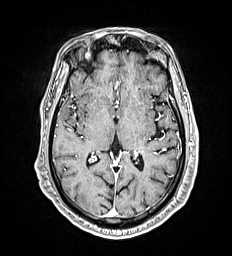
[im 110/176]
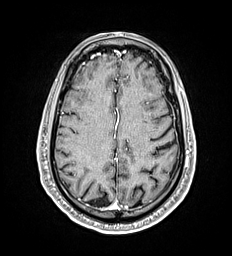
[im 132/176]
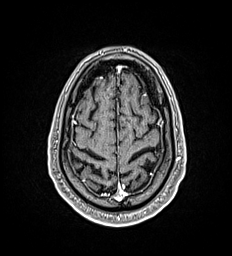
[im 154/176]
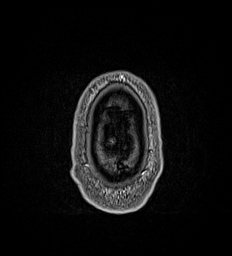
[im 176/176]
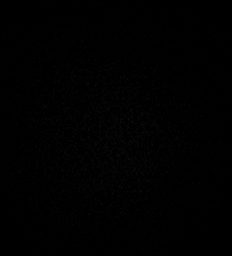

[Series 20: T1 post-contrast · coronal · 5.0mm · 0.57mm/px · 2 of 29 slices shown (2 of 3)]
[im 1/29]
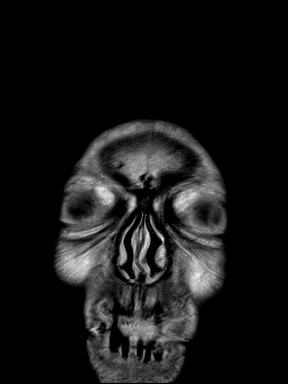
[im 29/29]
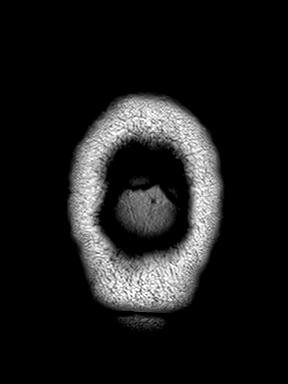

[Series 21: T1 post-contrast · sagittal · 5.0mm · 0.62mm/px · 1 of 23 slices shown (3 of 3)]
[im 1/23]
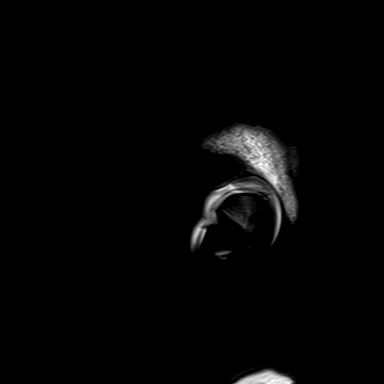

[48 of 48 positions shown; findings below may reference images not displayed]

FINDINGS: Brain: No restricted diffusion to suggest acute or subacute infarct.
No acute hemorrhage, mass, mass effect, or midline shift. No
hydrocephalus or extra-axial collection. No abnormal parenchymal or
meningeal enhancement.

Scattered T2 hyperintense signal in the periventricular white
matter, likely the sequela of minimal chronic small vessel ischemic
disease. Normal pituitary. Normal craniocervical junction.

Vascular: Patent arterial flow voids.

Skull and upper cervical spine: Normal marrow signal.

Sinuses/Orbits: Mild mucosal thickening. Dysconjugate gaze. The
orbits are otherwise unremarkable.

Other: The mastoids are well aerated.
IMPRESSION: No acute intracranial process. No evidence of metastatic disease in
the brain.

## 2022-09-12 ENCOUNTER — Other Ambulatory Visit: Payer: Self-pay | Admitting: *Deleted

## 2022-09-12 ENCOUNTER — Ambulatory Visit
Admission: RE | Admit: 2022-09-12 | Discharge: 2022-09-12 | Disposition: A | Discharge status: Home / Self Care | Payer: Medicare Other | Source: Ambulatory Visit | Attending: Radiation Oncology | Admitting: Radiation Oncology

## 2022-09-12 DIAGNOSIS — C3411 Malignant neoplasm of upper lobe, right bronchus or lung: Secondary | ICD-10-CM | POA: Insufficient documentation

## 2022-09-12 DIAGNOSIS — C61 Malignant neoplasm of prostate: Secondary | ICD-10-CM

## 2022-09-12 MED ORDER — IOHEXOL 300 MG/ML  SOLN
75.0000 mL | Freq: Once | INTRAMUSCULAR | Status: AC | PRN
  Administered 2022-09-12: 75 mL via INTRAVENOUS

## 2022-09-17 ENCOUNTER — Inpatient Hospital Stay (HOSPITAL_BASED_OUTPATIENT_CLINIC_OR_DEPARTMENT_OTHER): Payer: Medicare Other | Admitting: Internal Medicine

## 2022-09-17 ENCOUNTER — Inpatient Hospital Stay: Payer: Medicare Other

## 2022-09-17 ENCOUNTER — Inpatient Hospital Stay: Payer: Medicare Other | Attending: Internal Medicine

## 2022-09-17 ENCOUNTER — Ambulatory Visit: Payer: Medicare Other | Admitting: Oncology

## 2022-09-17 ENCOUNTER — Other Ambulatory Visit: Payer: Medicare Other

## 2022-09-17 ENCOUNTER — Ambulatory Visit: Payer: Medicare Other

## 2022-09-17 ENCOUNTER — Encounter: Payer: Self-pay | Admitting: Internal Medicine

## 2022-09-17 VITALS — BP 150/73 | HR 81 | Temp 97.9°F | Resp 16 | Wt 214.6 lb

## 2022-09-17 DIAGNOSIS — C61 Malignant neoplasm of prostate: Secondary | ICD-10-CM

## 2022-09-17 DIAGNOSIS — Z79899 Other long term (current) drug therapy: Secondary | ICD-10-CM | POA: Insufficient documentation

## 2022-09-17 DIAGNOSIS — C3402 Malignant neoplasm of left main bronchus: Secondary | ICD-10-CM | POA: Insufficient documentation

## 2022-09-17 DIAGNOSIS — C3411 Malignant neoplasm of upper lobe, right bronchus or lung: Secondary | ICD-10-CM

## 2022-09-17 DIAGNOSIS — Z5112 Encounter for antineoplastic immunotherapy: Secondary | ICD-10-CM | POA: Insufficient documentation

## 2022-09-17 LAB — COMPREHENSIVE METABOLIC PANEL
ALT: 17 U/L (ref 0–44)
AST: 18 U/L (ref 15–41)
Albumin: 4 g/dL (ref 3.5–5.0)
Alkaline Phosphatase: 92 U/L (ref 38–126)
Anion gap: 9 (ref 5–15)
BUN: 11 mg/dL (ref 8–23)
CO2: 26 mmol/L (ref 22–32)
Calcium: 8.8 mg/dL — ABNORMAL LOW (ref 8.9–10.3)
Chloride: 104 mmol/L (ref 98–111)
Creatinine, Ser: 0.89 mg/dL (ref 0.61–1.24)
GFR, Estimated: >60 mL/min (ref >60)
Glucose, Bld: 95 mg/dL (ref 70–99)
Potassium: 4.1 mmol/L (ref 3.5–5.1)
Sodium: 139 mmol/L (ref 135–145)
Total Bilirubin: 0.7 mg/dL (ref 0.3–1.2)
Total Protein: 7.4 g/dL (ref 6.5–8.1)

## 2022-09-17 LAB — TSH: TSH: 0.035 u[IU]/mL — ABNORMAL LOW (ref 0.350–4.500)

## 2022-09-17 LAB — CBC WITH DIFFERENTIAL/PLATELET
Abs Immature Granulocytes: 0.01 10*3/uL (ref 0.00–0.07)
Basophils Absolute: 0 10*3/uL (ref 0.0–0.1)
Basophils Relative: 0 %
Eosinophils Absolute: 0.2 10*3/uL (ref 0.0–0.5)
Eosinophils Relative: 4 %
HCT: 44.1 % (ref 39.0–52.0)
Hemoglobin: 14.1 g/dL (ref 13.0–17.0)
Immature Granulocytes: 0 %
Lymphocytes Relative: 20 %
Lymphs Abs: 0.9 10*3/uL (ref 0.7–4.0)
MCH: 30 pg (ref 26.0–34.0)
MCHC: 32 g/dL (ref 30.0–36.0)
MCV: 93.8 fL (ref 80.0–100.0)
Monocytes Absolute: 0.7 10*3/uL (ref 0.1–1.0)
Monocytes Relative: 14 %
Neutro Abs: 2.9 10*3/uL (ref 1.7–7.7)
Neutrophils Relative %: 62 %
Platelets: 148 10*3/uL — ABNORMAL LOW (ref 150–400)
RBC: 4.7 MIL/uL (ref 4.22–5.81)
RDW: 12.7 % (ref 11.5–15.5)
WBC: 4.7 10*3/uL (ref 4.0–10.5)
nRBC: 0 % (ref 0.0–0.2)

## 2022-09-17 LAB — PSA: Prostatic Specific Antigen: 1.45 ng/mL (ref 0.00–4.00)

## 2022-09-17 MED ORDER — SODIUM CHLORIDE 0.9 % IV SOLN
10.0000 mg/kg | Freq: Once | INTRAVENOUS | Status: AC
  Administered 2022-09-17: 1000 mg via INTRAVENOUS
  Filled 2022-09-17: qty 20

## 2022-09-17 MED ORDER — SODIUM CHLORIDE 0.9 % IV SOLN
Freq: Once | INTRAVENOUS | Status: AC
  Filled 2022-09-17: qty 250

## 2022-09-17 MED ORDER — BENZONATATE 100 MG PO CAPS: 100.0000 mg | ORAL_CAPSULE | Freq: Three times a day (TID) | ORAL | 1 refills | Status: AC | PRN

## 2022-09-17 MED ORDER — HEPARIN SOD (PORK) LOCK FLUSH 100 UNIT/ML IV SOLN
500.0000 [IU] | Freq: Once | INTRAVENOUS | Status: AC | PRN
  Administered 2022-09-17: 500 [IU]
  Filled 2022-09-17: qty 5

## 2022-09-17 NOTE — Progress Notes (Signed)
[place] NOTE  Patient Care Team: [facility] as PCP - General Telford Nab, RN as Oncology Nurse Navigator Cammie Sickle, MD as Consulting Physician (Oncology)  CHIEF COMPLAINTS/PURPOSE OF CONSULTATION: lung cancer  #  Oncology History Overview Note  IMPRESSION: 1. Thick-walled cavitary lesion of the posterior right upper lobe abutting the major fissure measuring 3.3 x 3.3 cm, highly concerning for primary lung malignancy or metastasis. 2. Although evaluation is limited by lack of intravenous contrast, suspect significant left hilar lymphadenopathy or mass, with apparent obstruction of the left upper lobe bronchus and severe narrowing of the left lower lobe bronchus. This finding is likewise highly concerning for primary lung malignancy or alternately nodal metastasis. Contrast enhanced CT may be helpful to more clearly evaluate the hilum. 3. There may be additional right hilar lymphadenopathy, again assessment limited by noncontrast CT. 4. Scattered heterogeneous and ground-glass airspace opacity throughout the upper lobes and right lower lobe, possibly reflecting postobstructive airspace disease in the left lung generally nonspecific. 5. Emphysema. 6. Coronary artery disease.   Aortic Atherosclerosis (ICD10-I70.0) and Emphysema (ICD10-J43.9).     Electronically Signed   By: Delanna Ahmadi M.D.   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  Patient also status post SBRT to the right lung nodule.   # CAD; COPD [Dr.Fleming- O2 none now]; DM; HTN   Malignant neoplasm of upper lobe of right lung ([place])  02/01/2022 Initial Diagnosis    Malignant neoplasm of upper lobe of right lung ([place])   02/01/2022 Cancer Staging   Staging form: Lung, AJCC 8th Edition - Clinical: Stage IIIB (cT2a, cN3, cM0) - Signed by Cammie Sickle, MD on 02/01/2022   02/19/2022 - 04/09/2022 Chemotherapy   Patient is on Treatment Plan : LUNG Carboplatin / Paclitaxel + XRT q7d     06/26/2022 -  Chemotherapy   Patient is on Treatment Plan : LUNG Durvalumab (10) q14d        HISTORY OF PRESENTING ILLNESS: Alone.  Ambulating independently.  Louis Sullivan 72 y.o.  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  Patient denies new problems/concerns today. Denies any worsening cough; no phlegm.  No hemoptysis.  Denies any worsening shortness of breath.   Review of Systems  Constitutional:  Negative for chills, diaphoresis, fever, malaise/fatigue and weight loss.  HENT:  Negative for nosebleeds and sore throat.   Eyes:  Negative for double vision.  Respiratory:  Positive for cough and shortness of breath. Negative for hemoptysis, sputum production and wheezing.   Cardiovascular:  Negative for chest pain, palpitations, orthopnea and leg swelling.  Gastrointestinal:  Negative for abdominal pain, blood in stool, constipation, diarrhea, heartburn, melena, nausea and vomiting.  Genitourinary:  Negative for dysuria, frequency and urgency.  Musculoskeletal:  Positive for joint pain. Negative for back pain.  Skin: Negative.  Negative for itching and rash.  Neurological:  Negative for dizziness, tingling, focal weakness, weakness and headaches.  Endo/Heme/Allergies:  Does not bruise/bleed easily.  Psychiatric/Behavioral:  Negative for depression. The patient is not nervous/anxious and does not have insomnia.      MEDICAL  HISTORY:  Past Medical History:  Diagnosis Date   COPD (chronic  obstructive pulmonary disease) ([place])    Coronary artery disease    1 stent placed in about 2008   Diabetes mellitus without complication (HCC)    Dyspnea    Headache    migraines   Hyperlipidemia    Hypertension    Pneumonia 12/2021   hospitalized at [facility]: Past Surgical History:  Procedure Laterality Date   CARDIAC CATHETERIZATION     COLONOSCOPY     IR IMAGING GUIDED PORT INSERTION  02/12/2022   VIDEO BRONCHOSCOPY WITH ENDOBRONCHIAL NAVIGATION N/A 01/23/2022   Procedure: VIDEO BRONCHOSCOPY WITH ENDOBRONCHIAL NAVIGATION;  Surgeon: Ottie Glazier, MD;  Location: [facility] ORS;  Service: Thoracic;  Laterality: N/A;   VIDEO BRONCHOSCOPY WITH ENDOBRONCHIAL ULTRASOUND N/A 01/23/2022   Procedure: VIDEO BRONCHOSCOPY WITH ENDOBRONCHIAL ULTRASOUND;  Surgeon: Ottie Glazier, MD;  Location: [facility] ORS;  Service: Thoracic;  Laterality: N/A;    SOCIAL HISTORY: Social History   Socioeconomic History   Marital status: Widowed    Spouse name: Not on file   Number of children: Not on file   Years of education: Not on file   Highest education level: Not on file  Occupational History   Not on file  Tobacco Use   Smoking status: Former    Packs/day: 1.00    Years: 20.00    Total pack years: 20.00    Types: Cigarettes    Quit date: 02/11/2022    Years since quitting: 0.5   Smokeless tobacco: Never   Tobacco comments:    Request to MD for Nicotine patch to help him quit smoking.  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 Lives in [place] self. Son passed in 2021;MI wife passed away in 1990s/aneurysm; daughter in [place].    Social Determinants of Health   Financial Resource Strain: Not on file  Food Insecurity: Not on file  Transportation Needs: Not on  file  Physical Activity: Not on file  Stress: Not on file  Social Connections: Not on file  Intimate Partner Violence: Not on file    FAMILY HISTORY: Family History  Problem Relation Age of Onset   Throat cancer Brother     ALLERGIES:  is allergic to paclitaxel.  MEDICATIONS:  Current Outpatient Medications  Medication Sig Dispense Refill   albuterol (PROVENTIL) (5 MG/ML) 0.5% nebulizer solution Take 2.5 mg by nebulization every morning.     albuterol (VENTOLIN HFA) 108 (90 Base) MCG/ACT inhaler Inhale 2 puffs into the lungs every 4 (four) hours as needed for wheezing or shortness of breath.     aspirin EC 81 MG tablet Take 81 mg by mouth daily.     atorvastatin (LIPITOR) 80 MG tablet Take 1 tablet (80 mg total) by mouth every morning. 90 tablet 1   blood glucose meter kit and supplies KIT Dispense based on patient and insurance preference. Use up to four times daily as directed. 1 each 0   buPROPion (WELLBUTRIN XL) 150 MG 24 hr tablet Take 150 mg by mouth daily.     Cholecalciferol (VITAMIN D3 PO) Take 1 tablet by mouth daily at 6 (six) AM.     hydrocortisone 2.5 % ointment Apply topically  2 (two) times daily. Apply to skin rash twice a day as needed for itch. 30 g 0   lidocaine-prilocaine (EMLA) cream Apply on the port. 30 -45 min  prior to port access. 30 g 3   losartan (COZAAR) 100 MG tablet Take 100 mg by mouth every evening.     metFORMIN (GLUCOPHAGE) 1000 MG tablet Take 1,000 mg by mouth daily with breakfast.     metoprolol succinate (TOPROL XL) 25 MG 24 hr tablet Take 1 tablet (25 mg total) by mouth daily. 30 tablet 3   OXYGEN Inhale 2 L into the lungs at bedtime.     benzonatate (TESSALON) 100 MG capsule Take 1 capsule (100 mg total) by mouth 3 (three) times daily as needed for cough. 45 capsule 1   Nicotine 21-14-7 MG/24HR KIT Place 1 patch onto the skin daily. (Patient not taking: Reported on 08/07/2022) 60 kit 1   ondansetron (ZOFRAN) 8 MG tablet One pill every 8 hours as  needed for nausea/vomitting. (Patient not taking: Reported on 06/19/2022) 40 tablet 1   prochlorperazine (COMPAZINE) 10 MG tablet TAKE 1 TABLET(10 MG) BY MOUTH EVERY 6 HOURS AS NEEDED FOR NAUSEA OR VOMITING (Patient not taking: Reported on 06/19/2022) 40 tablet 1   traZODone (DESYREL) 50 MG tablet Take 50 mg by mouth at bedtime. (Patient not taking: Reported on 06/26/2022)     No current facility-administered medications for this visit.   Facility-Administered Medications Ordered in Other Visits  Medication Dose Route Frequency Provider Last Rate Last Admin   durvalumab (IMFINZI) 1,000 mg in sodium chloride 0.9 % 100 mL chemo infusion  10 mg/kg (Treatment Plan Recorded) Intravenous Once Verlon Au, NP 120 mL/hr at 09/17/22 1411 1,000 mg at 09/17/22 1411   heparin lock flush 100 UNIT/ML injection            heparin lock flush 100 UNIT/ML injection            heparin lock flush 100 unit/mL  500 Units Intracatheter Once PRN Verlon Au, NP          .  PHYSICAL EXAMINATION: ECOG PERFORMANCE STATUS: 1 - Symptomatic but completely ambulatory  Vitals:   09/17/22 1300  BP: (!) 150/73  Pulse: 81  Resp: 16  Temp: 97.9 F (36.6 C)     Filed Weights   09/17/22 1300  Weight: 214 lb 9.6 oz (97.3 kg)      Physical Exam Vitals and nursing note reviewed.  HENT:     Head: Normocephalic and atraumatic.     Mouth/Throat:     Pharynx: Oropharynx is clear.  Eyes:     Extraocular Movements: Extraocular movements intact.     Pupils: Pupils are equal, round, and reactive to light.  Cardiovascular:     Rate and Rhythm: Normal rate and regular rhythm.  Pulmonary:     Comments: Decreased breath sounds bilaterally.  Abdominal:     Palpations: Abdomen is soft.  Musculoskeletal:        General: Normal range of motion.     Cervical back: Normal range of motion.  Skin:    General: Skin is warm.  Neurological:     General: No focal deficit present.     Mental Status: He is alert and  oriented to person, place, and time.  Psychiatric:        Behavior: Behavior normal.        Judgment: Judgment normal.      LABORATORY DATA:  I have reviewed the  data as listed Lab Results  Component Value Date   WBC 4.7 09/17/2022   HGB 14.1 09/17/2022   HCT 44.1 09/17/2022   MCV 93.8 09/17/2022   PLT 148 (L) 09/17/2022   Recent Labs    08/21/22 0922 09/03/22 0844 09/17/22 1252  NA 138 140 139  K 4.0 4.1 4.1  CL 104 104 104  CO2 _0 GLUCOSE 98 102* 95  BUN _1 CREATININE 0.96 0.95 0.89  CALCIUM 8.7* 8.8* 8.8*  GFRNONAA >60 >60 >60  PROT 7.1 7.4 7.4  ALBUMIN 3.9 3.9 4.0  AST _2 ALT _3 ALKPHOS 77 87 92  BILITOT 0.6 0.3 0.7    RADIOGRAPHIC STUDIES: I have personally reviewed the radiological images as listed and agreed with the findings in the report. CT Chest W Contrast  Result Date: 09/14/2022 CLINICAL DATA:  Non-small cell lung cancer restaging, assess treatment response, ongoing chemotherapy * Tracking Code: BO * EXAM: CT CHEST WITH CONTRAST TECHNIQUE: Multidetector CT imaging of the chest was performed during intravenous contrast administration. RADIATION DOSE REDUCTION: This exam was performed according to the departmental dose-optimization program which includes automated exposure control, adjustment of the mA and/or kV according to patient size and/or use of iterative reconstruction technique. CONTRAST:  64m OMNIPAQUE IOHEXOL 300 MG/ML  SOLN COMPARISON:  06/20/2022 FINDINGS: Cardiovascular: Right chest port catheter. Aortic atherosclerosis. Three-vessel coronary artery calcifications. Normal heart size. No pericardial effusion. Mediastinum/Nodes: Unchanged soft tissue thickening of the left hilum (series 2, image 76). Unchanged prominent subcentimeter mediastinal lymph nodes (series 2, image 54). Thyroid gland, trachea, and esophagus demonstrate no significant findings. Lungs/Pleura: Severe centrilobular and paraseptal emphysema. Diffuse  bilateral bronchial wall thickening. Slight interval decrease in size of a thick-walled cavitary lesion of the posterior right upper lobe measuring 1.7 x 1.5 cm (series 3, image 39). No significant change in an irregular opacity of the peripheral right upper lobe measuring 1.1 x 0.6 cm (series 3, image 47). New irregular heterogeneous opacity of the anterior right pulmonary apex measuring 2.6 x 2.6 cm (series 3, image 33). Similar appearance of extensive, clustered centrilobular and tree-in-bud nodularity in the bilateral lung bases (series 3, image 131). No pleural effusion or pneumothorax. Upper Abdomen: No acute abnormality. Multiple simple appearing benign bilateral renal cortical cysts, incompletely imaged. Unchanged fatty attenuation left adrenal nodule measuring 1.1 cm, not previously FDG avid and consistent with a benign adenoma (series 4, image 100). Musculoskeletal: No chest wall abnormality. No acute osseous findings. IMPRESSION: 1. Slight interval decrease in size of a thick-walled cavitary lesion of the posterior right upper lobe consistent with continued treatment response. 2. No significant change in an irregular opacity of the peripheral right upper lobe, which was new on prior examination and remains nonspecific. Attention on follow-up. 3. New irregular heterogeneous opacity of the anterior right pulmonary apex measuring 2.6 x 2.6 cm. This may be infectious or inflammatory a perhaps reflect developing radiation pneumonitis/fibrosis if there has been local radiation therapy in this vicinity. Attention on follow-up. 4. Similar appearance of extensive, clustered centrilobular and tree-in-bud nodularity in the bilateral lung bases, most suggestive of aspiration, which may be ongoing. 5. Unchanged soft tissue thickening of the left hilum, corresponding to previous site of abnormal FDG avidity. Continued attention on follow-up. 6. Unchanged prominent subcentimeter mediastinal lymph nodes. 7. Severe  emphysema and diffuse bilateral bronchial wall thickening. 8. Coronary artery disease. Aortic Atherosclerosis (ICD10-I70.0) and Emphysema (ICD10-J43.9). Electronically Signed   By: ACristie Hem  Cherly Beach M.D.   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  PET MAY 84XQ- Hypermetabolic cavitary mass of the right upper lobe; Hypermetabolic left hilar mass versus conglomerate hilar lymphadenopathy, possibly due to nodal metastatic disease or primary lung malignancy. NGS-F-ONE- PDL-1 0%; no targets**.  Currently status post with Carbo- Abraxane with RT [facility] 26th - 07/26; Taxol:allergy] weekly #6 chemotherapy [uly 25 th]. Right upper lobe mass -also SBRT [08/07- 05-06-2022]. DEC 28th 2023-  Interval decrease in size of the cavitary posterior right upper lobe pulmonary lesion; stable left hilar fullness [status post chemoradiation]; but new- RUL- 2.5 cm inflmmatory vs infectious- monitor for now. Patient currently on adjuvant immunotherapy with Durvalumab.    # patient will proceed with immunotherapy-Durvalumab.  Labs today reviewed;  acceptable for treatment today.  OCT 2023- TSH-WNL.    # Constipation- sec to chemo- continue Miralax once or twice as needed. STABLE.   # Smoking: Active smoker; quit smoking recently. OFF  nicotine patches. STABLE.   # COPD [Dr.Fleming]: on proair; recommend- compliance with inhalerrs/nebs; cough is worse -recommend Tessalon pearls prn for cough.  Prescription given.  # IV ACCESS: port-functional.   # Vaccination: s/p Flu shot    # DISPOSITION: # Durvalumab today.  # follow up in 2 week- MD; labs-port- cbc/cmp;  Durvalumab -Dr.B  # I reviewed the blood work- with the patient in detail; also reviewed the imaging independently [as  summarized above]; and with the patient in detail.               All questions were answered. The patient knows to call the clinic with any problems, questions or concerns.       Cammie Sickle, MD 09/17/2022 2:14 PM

## 2022-09-17 NOTE — Assessment & Plan Note (Addendum)
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  PET MAY 88KC- Hypermetabolic cavitary mass of the right upper lobe; Hypermetabolic left hilar mass versus conglomerate hilar lymphadenopathy, possibly due to nodal metastatic disease or primary lung malignancy. NGS-F-ONE- PDL-1 0%; no targets**.  Currently status post with Carbo- Abraxane with RT [facility] 26th - 07/26; Taxol:allergy] weekly #6 chemotherapy [uly 25 th]. Right upper lobe mass -also SBRT [08/07- 05-06-2022]. DEC 28th 2023-  Interval decrease in size of the cavitary posterior right upper lobe pulmonary lesion; stable left hilar fullness [status post chemoradiation]; but new- RUL- 2.5 cm inflmmatory vs infectious- monitor for now. Patient currently on adjuvant immunotherapy with Durvalumab.    # patient will proceed with immunotherapy-Durvalumab.  Labs today reviewed;  acceptable for treatment today.  OCT 2023- TSH-WNL.    # Constipation- sec to chemo- continue Miralax once or twice as needed. STABLE.   # Smoking: Active smoker; quit smoking recently. OFF  nicotine patches. STABLE.   # COPD [Dr.Fleming]: on proair; recommend- compliance with inhalerrs/nebs; cough is worse -recommend Tessalon pearls prn for cough.  Prescription given.  # IV ACCESS: port-functional.   # Vaccination: s/p Flu shot    # DISPOSITION: # Durvalumab today.  # follow up in 2 week- MD; labs-port- cbc/cmp;  Durvalumab -Dr.B  # I reviewed the blood work- with the patient in detail; also reviewed the imaging independently [as summarized above]; and with the patient in detail.

## 2022-09-17 NOTE — Progress Notes (Signed)
Patient denies new problems/concerns today.   °

## 2022-09-17 NOTE — Patient Instructions (Signed)
[facility] Inpatient CANCER CTR AT [place]  Discharge Instructions: Thank you for choosing [facility] to provide your oncology and hematology care.  If you have a lab appointment with the [facility], please go directly to the [facility] and check in at the registration area.  Wear comfortable clothing and clothing appropriate for easy access to any Portacath or PICC line.   We strive to give you quality time with your provider. You may need to reschedule your appointment if you arrive late (15 or more minutes).  Arriving late affects you and other patients whose appointments are after yours.  Also, if you miss three or more appointments without notifying the office, you may be dismissed from the clinic at the provider's discretion.      For prescription refill requests, have your pharmacy contact our office and allow 72 hours for refills to be completed.    Today you received the following chemotherapy and/or immunotherapy agents Imfinzi      To help prevent nausea and vomiting after your treatment, we encourage you to take your nausea medication as directed.  BELOW ARE SYMPTOMS THAT SHOULD BE REPORTED IMMEDIATELY: *FEVER GREATER THAN 100.4 F (38 C) OR HIGHER *CHILLS OR SWEATING *NAUSEA AND VOMITING THAT IS NOT CONTROLLED WITH YOUR NAUSEA MEDICATION *UNUSUAL SHORTNESS OF BREATH *UNUSUAL BRUISING OR BLEEDING *URINARY PROBLEMS (pain or burning when urinating, or frequent urination) *BOWEL PROBLEMS (unusual diarrhea, constipation, pain near the anus) TENDERNESS IN MOUTH AND THROAT WITH OR WITHOUT PRESENCE OF ULCERS (sore throat, sores in mouth, or a toothache) UNUSUAL RASH, SWELLING OR PAIN  UNUSUAL VAGINAL DISCHARGE OR ITCHING   Items with * indicate a potential emergency and should be followed up as soon as possible or go to the Emergency Department if any problems should occur.  Please show the CHEMOTHERAPY ALERT CARD or IMMUNOTHERAPY ALERT CARD at check-in to the  Emergency Department and triage nurse.  Should you have questions after your visit or need to cancel or reschedule your appointment, please contact [facility] [place] [facility] [place]  [phone] and follow the prompts.  Office hours are 8:00 a.m. to 4:30 p.m. Monday - Friday. Please note that voicemails left after 4:00 p.m. may not be returned until the following business day.  We are closed weekends and major holidays. You have access to a nurse at all times for urgent questions. Please call the main number to the clinic [phone] and follow the prompts.  For any non-urgent questions, you may also contact your provider using MyChart. We now offer e-Visits for anyone 58 and older to request care online for non-urgent symptoms. For details visit [email or web address].   Also download the MyChart app! Go to the app store, search "MyChart", open the app, select [facility], and log in with your MyChart username and password.

## 2022-09-18 ENCOUNTER — Other Ambulatory Visit: Payer: Self-pay | Admitting: *Deleted

## 2022-09-18 ENCOUNTER — Ambulatory Visit: Payer: Medicare Other

## 2022-09-18 ENCOUNTER — Inpatient Hospital Stay: Payer: Medicare Other

## 2022-09-18 DIAGNOSIS — C61 Malignant neoplasm of prostate: Secondary | ICD-10-CM

## 2022-09-18 LAB — T4: T4, Total: 10.7 ug/dL (ref 4.5–12.0)

## 2022-09-25 ENCOUNTER — Encounter: Payer: Self-pay | Admitting: Radiation Oncology

## 2022-09-25 ENCOUNTER — Other Ambulatory Visit: Payer: Self-pay | Admitting: *Deleted

## 2022-09-25 ENCOUNTER — Ambulatory Visit
Admission: RE | Admit: 2022-09-25 | Discharge: 2022-09-25 | Disposition: A | Discharge status: Home / Self Care | Payer: Medicare Other | Source: Ambulatory Visit | Attending: Radiation Oncology | Admitting: Radiation Oncology

## 2022-09-25 VITALS — BP 132/79 | HR 71 | Temp 97.4°F | Resp 20 | Ht 69.0 in | Wt 214.0 lb

## 2022-09-25 DIAGNOSIS — Z923 Personal history of irradiation: Secondary | ICD-10-CM | POA: Insufficient documentation

## 2022-09-25 DIAGNOSIS — C3412 Malignant neoplasm of upper lobe, left bronchus or lung: Secondary | ICD-10-CM | POA: Insufficient documentation

## 2022-09-25 DIAGNOSIS — C3411 Malignant neoplasm of upper lobe, right bronchus or lung: Secondary | ICD-10-CM

## 2022-09-25 DIAGNOSIS — C61 Malignant neoplasm of prostate: Secondary | ICD-10-CM

## 2022-09-25 NOTE — Progress Notes (Signed)
Radiation Oncology Follow up Note  Name: Louis Sullivan   Date:   09/25/2022 MRN:  [id number] DOB: 1950/11/19    This 72 y.o. male presents to the clinic today for 55-month follow-up status post concurrent chemoradiation therapy for stage IIIa non-small cell lung cancer left lung as well as SBRT  to his right upper lobe.  REFERRING PROVIDER: [facility]  HPI: Patient is a 72 year old male now out for months having completed concurrent chemoradiation therapy to his left lung for stage IIIa non-small cell lung cancer.  He is also status post SBRT to his right upper lobe again for non-small cell lung cancer seen today in routine follow-up he is doing well.  Has a slight cough no hemoptysis or chest tightness or change in his pulmonary status.  .  Recent CT scan which I have reviewed showed interval decrease in size of thick-walled cavity in the posterior right upper lobe consistent with treatment response there is also no significant change in opacity in the peripheral right upper lobe and unchanged soft tissue thickening left hilum corresponding to previous site of FDG activity.  All of this is consistent with treatment response.  COMPLICATIONS OF TREATMENT: none  FOLLOW UP COMPLIANCE: keeps appointments   PHYSICAL EXAM:  BP 132/79 (BP Location: Right Arm, Patient Position: Sitting, Cuff Size: Normal)   Pulse 71   Temp (!) 97.4 F (36.3 C) (Tympanic)   Resp 20   Ht 5\' 9"  (1.753 m)   Wt 214 lb (97.1 kg)   BMI 31.60 kg/m  Well-developed well-nourished patient in NAD. HEENT reveals PERLA, EOMI, discs not visualized.  Oral cavity is clear. No oral mucosal lesions are identified. Neck is clear without evidence of cervical or supraclavicular adenopathy. Lungs are clear to A&P. Cardiac examination is essentially unremarkable with regular rate and rhythm without murmur rub or thrill. Abdomen is benign with no organomegaly or masses noted. Motor sensory and DTR levels are equal and symmetric  in the upper and lower extremities. Cranial nerves II through XII are grossly intact. Proprioception is intact. No peripheral adenopathy or edema is identified. No motor or sensory levels are noted. Crude visual fields are within normal range.  RADIOLOGY RESULTS: CT scans reviewed compatible with above-stated findings  PLAN: Present, pleased with overall response by CT criteria.  On pleased with his overall progress clinically.  I am asked to see him back in 6 months with a follow-up CT scan.  He continues close follow-up care with Dr. Jacinto Reap.  Patient is to call with any concerns.  I would like to take this opportunity to thank you for allowing me to participate in the care of your patient.Noreene Filbert, MD

## 2022-10-01 ENCOUNTER — Other Ambulatory Visit: Payer: Self-pay | Admitting: *Deleted

## 2022-10-01 ENCOUNTER — Encounter: Payer: Self-pay | Admitting: Internal Medicine

## 2022-10-01 ENCOUNTER — Inpatient Hospital Stay: Payer: Medicare Other

## 2022-10-01 ENCOUNTER — Inpatient Hospital Stay (HOSPITAL_BASED_OUTPATIENT_CLINIC_OR_DEPARTMENT_OTHER): Payer: Medicare Other | Admitting: Internal Medicine

## 2022-10-01 VITALS — BP 150/72 | HR 76 | Temp 97.0°F | Resp 18 | Ht 69.0 in | Wt 216.8 lb

## 2022-10-01 DIAGNOSIS — C3411 Malignant neoplasm of upper lobe, right bronchus or lung: Secondary | ICD-10-CM

## 2022-10-01 DIAGNOSIS — E559 Vitamin D deficiency, unspecified: Secondary | ICD-10-CM | POA: Diagnosis not present

## 2022-10-01 DIAGNOSIS — Z5112 Encounter for antineoplastic immunotherapy: Secondary | ICD-10-CM | POA: Diagnosis not present

## 2022-10-01 LAB — COMPREHENSIVE METABOLIC PANEL
ALT: 17 U/L (ref 0–44)
AST: 19 U/L (ref 15–41)
Albumin: 3.9 g/dL (ref 3.5–5.0)
Alkaline Phosphatase: 86 U/L (ref 38–126)
Anion gap: 8 (ref 5–15)
BUN: 10 mg/dL (ref 8–23)
CO2: 27 mmol/L (ref 22–32)
Calcium: 8.5 mg/dL — ABNORMAL LOW (ref 8.9–10.3)
Chloride: 102 mmol/L (ref 98–111)
Creatinine, Ser: 0.89 mg/dL (ref 0.61–1.24)
GFR, Estimated: >60 mL/min (ref >60)
Glucose, Bld: 101 mg/dL — ABNORMAL HIGH (ref 70–99)
Potassium: 3.9 mmol/L (ref 3.5–5.1)
Sodium: 137 mmol/L (ref 135–145)
Total Bilirubin: 0.6 mg/dL (ref 0.3–1.2)
Total Protein: 7.1 g/dL (ref 6.5–8.1)

## 2022-10-01 LAB — CBC WITH DIFFERENTIAL/PLATELET
Abs Immature Granulocytes: 0.01 10*3/uL (ref 0.00–0.07)
Basophils Absolute: 0 10*3/uL (ref 0.0–0.1)
Basophils Relative: 1 %
Eosinophils Absolute: 0.2 10*3/uL (ref 0.0–0.5)
Eosinophils Relative: 4 %
HCT: 43.8 % (ref 39.0–52.0)
Hemoglobin: 13.6 g/dL (ref 13.0–17.0)
Immature Granulocytes: 0 %
Lymphocytes Relative: 18 %
Lymphs Abs: 0.8 10*3/uL (ref 0.7–4.0)
MCH: 29.4 pg (ref 26.0–34.0)
MCHC: 31.1 g/dL (ref 30.0–36.0)
MCV: 94.8 fL (ref 80.0–100.0)
Monocytes Absolute: 0.6 10*3/uL (ref 0.1–1.0)
Monocytes Relative: 16 %
Neutro Abs: 2.5 10*3/uL (ref 1.7–7.7)
Neutrophils Relative %: 61 %
Platelets: 157 10*3/uL (ref 150–400)
RBC: 4.62 MIL/uL (ref 4.22–5.81)
RDW: 13.2 % (ref 11.5–15.5)
WBC: 4.1 10*3/uL (ref 4.0–10.5)
nRBC: 0 % (ref 0.0–0.2)

## 2022-10-01 LAB — TSH: TSH: <0.01 u[IU]/mL — ABNORMAL LOW (ref 0.350–4.500)

## 2022-10-01 MED ORDER — SODIUM CHLORIDE 0.9 % IV SOLN
10.0000 mg/kg | Freq: Once | INTRAVENOUS | Status: AC
  Administered 2022-10-01: 1000 mg via INTRAVENOUS
  Filled 2022-10-01: qty 20

## 2022-10-01 MED ORDER — SODIUM CHLORIDE 0.9 % IV SOLN
Freq: Once | INTRAVENOUS | Status: AC
  Filled 2022-10-01: qty 250

## 2022-10-01 MED ORDER — HEPARIN SOD (PORK) LOCK FLUSH 100 UNIT/ML IV SOLN
500.0000 [IU] | Freq: Once | INTRAVENOUS | Status: AC | PRN
  Administered 2022-10-01: 500 [IU]
  Filled 2022-10-01: qty 5

## 2022-10-01 NOTE — Assessment & Plan Note (Addendum)
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  PET MAY 25th- Hypermetabolic cavitary mass of the right upper lobe; Hypermetabolic left hilar mass versus conglomerate hilar lymphadenopathy, possibly due to nodal metastatic disease or primary lung malignancy. NGS-F-ONE- PDL-1 0%; no targets**.  Currently status post with Carbo- Abraxane with RT [July 26th - 07/26; Taxol:allergy] weekly #6 chemotherapy [uly 25 th]. Right upper lobe mass -also SBRT [08/07- 05-06-2022]. DEC 28th 2023-  Interval decrease in size of the cavitary posterior right upper lobe pulmonary lesion; stable left hilar fullness [status post chemoradiation]; but new- RUL- 2.5 cm inflmmatory vs infectious- monitor for now. Patient currently on adjuvant immunotherapy with Durvalumab.    # patient will proceed with immunotherapy-Durvalumab.  Labs today reviewed;  acceptable for treatment today.  OCT 2023- TSH-WNL.  Stable.   # Constipation- sec to chemo- continue Miralax once or twice as needed. Stable.   # Smoking: Active smoker; quit smoking recently. OFF  nicotine patches. Stable.   # COPD [Dr.Fleming]:on home O2 lits-[place];  on proair; compliance with inhalerrs/nebs; cough is stable on  Tessalon pearls prn for cough. Stable.   # IV ACCESS: port-functional.   # Vaccination: s/p Flu shot patient is not taking his   # DISPOSITION: # Durvalumab today.  # follow up in 2 week- MD; labs-port- cbc/cmp; vit D 25-OH levels;  Durvalumab -Dr.B

## 2022-10-01 NOTE — Progress Notes (Addendum)
[place] NOTE  Patient Care Team: [place] as PCP - General Telford Nab, RN as Oncology Nurse Navigator Cammie Sickle, MD as Consulting Physician (Oncology)  CHIEF COMPLAINTS/PURPOSE OF CONSULTATION: lung cancer  #  Oncology History Overview Note  IMPRESSION: 1. Thick-walled cavitary lesion of the posterior right upper lobe abutting the major fissure measuring 3.3 x 3.3 cm, highly concerning for primary lung malignancy or metastasis. 2. Although evaluation is limited by lack of intravenous contrast, suspect significant left hilar lymphadenopathy or mass, with apparent obstruction of the left upper lobe bronchus and severe narrowing of the left lower lobe bronchus. This finding is likewise highly concerning for primary lung malignancy or alternately nodal metastasis. Contrast enhanced CT may be helpful to more clearly evaluate the hilum. 3. There may be additional right hilar lymphadenopathy, again assessment limited by noncontrast CT. 4. Scattered heterogeneous and ground-glass airspace opacity throughout the upper lobes and right lower lobe, possibly reflecting postobstructive airspace disease in the left lung generally nonspecific. 5. Emphysema. 6. Coronary artery disease.   Aortic Atherosclerosis (ICD10-I70.0) and Emphysema (ICD10-J43.9).     Electronically Signed   By: Delanna Ahmadi M.D.   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  Patient also status post SBRT to the right lung nodule.   # CAD; COPD [Dr.Fleming- O2 none now]; DM; HTN   Malignant neoplasm of upper lobe of right lung ([place])  02/01/2022 Initial Diagnosis    Malignant neoplasm of upper lobe of right lung ([place])   02/01/2022 Cancer Staging   Staging form: Lung, AJCC 8th Edition - Clinical: Stage IIIB (cT2a, cN3, cM0) - Signed by Cammie Sickle, MD on 02/01/2022   02/19/2022 - 04/09/2022 Chemotherapy   Patient is on Treatment Plan : LUNG Carboplatin / Paclitaxel + XRT q7d     06/26/2022 -  Chemotherapy   Patient is on Treatment Plan : LUNG Durvalumab (10) q14d        HISTORY OF PRESENTING ILLNESS: Alone.  Ambulating independently.  Louis Sullivan 72 y.o.  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   Patient still having a productive mild cough. Left side pain for past 2 days, 2-3/10 pain scale. Patient is not taking Tessalon Perles as recommended.  D  No hemoptysis.  Denies any worsening shortness of breath.  No fever no chills.   Review of Systems  Constitutional:  Negative for chills, diaphoresis, fever, malaise/fatigue and weight loss.  HENT:  Negative for nosebleeds and sore throat.   Eyes:  Negative for double vision.  Respiratory:  Positive for cough and shortness of breath. Negative for hemoptysis, sputum production and wheezing.   Cardiovascular:  Negative for chest pain, palpitations, orthopnea and leg swelling.  Gastrointestinal:  Negative for abdominal pain, blood in stool, constipation, diarrhea, heartburn, melena, nausea and vomiting.  Genitourinary:  Negative for dysuria, frequency and urgency.  Musculoskeletal:  Positive for joint pain. Negative for back pain.  Skin: Negative.  Negative for itching and rash.  Neurological:  Negative for dizziness, tingling, focal weakness, weakness and headaches.  Endo/Heme/Allergies:  Does not bruise/bleed easily.  Psychiatric/Behavioral:  Negative for depression. The patient  is not nervous/anxious and does not have insomnia.      MEDICAL HISTORY:  Past Medical  History:  Diagnosis Date   COPD (chronic obstructive pulmonary disease) (HCC)    Coronary artery disease    1 stent placed in about 2008   Diabetes mellitus without complication (HCC)    Dyspnea    Headache    migraines   Hyperlipidemia    Hypertension    Pneumonia 12/2021   hospitalized at [facility]    SURGICAL HISTORY: Past Surgical History:  Procedure Laterality Date   CARDIAC CATHETERIZATION     COLONOSCOPY     IR IMAGING GUIDED PORT INSERTION  02/12/2022   VIDEO BRONCHOSCOPY WITH ENDOBRONCHIAL NAVIGATION N/A 01/23/2022   Procedure: VIDEO BRONCHOSCOPY WITH ENDOBRONCHIAL NAVIGATION;  Surgeon: Vida Rigger, MD;  Location: [facility] ORS;  Service: Thoracic;  Laterality: N/A;   VIDEO BRONCHOSCOPY WITH ENDOBRONCHIAL ULTRASOUND N/A 01/23/2022   Procedure: VIDEO BRONCHOSCOPY WITH ENDOBRONCHIAL ULTRASOUND;  Surgeon: Vida Rigger, MD;  Location: [facility] ORS;  Service: Thoracic;  Laterality: N/A;    SOCIAL HISTORY: Social History   Socioeconomic History   Marital status: Widowed    Spouse name: Not on file   Number of children: Not on file   Years of education: Not on file   Highest education level: Not on file  Occupational History   Not on file  Tobacco Use   Smoking status: Former    Packs/day: 1.00    Years: 20.00    Total pack years: 20.00    Types: Cigarettes    Quit date: 02/11/2022    Years since quitting: 0.6   Smokeless tobacco: Never   Tobacco comments:    Request to MD for Nicotine patch to help him quit smoking.  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 Lives in [place] self. Son passed in 2021;MI wife passed away in 1990s/aneurysm; daughter in [place].    Social Determinants of Health   Financial Resource Strain: Not on file  Food Insecurity:  Not on file  Transportation Needs: Not on file  Physical Activity: Not on file  Stress: Not on file  Social Connections: Not on file  Intimate Partner Violence: Not on file    FAMILY HISTORY: Family History  Problem Relation Age of Onset   Throat cancer Brother     ALLERGIES:  is allergic to paclitaxel.  MEDICATIONS:  Current Outpatient Medications  Medication Sig Dispense Refill   albuterol (PROVENTIL) (5 MG/ML) 0.5% nebulizer solution Take 2.5 mg by nebulization every morning.     albuterol (VENTOLIN HFA) 108 (90 Base) MCG/ACT inhaler Inhale 2 puffs into the lungs every 4 (four) hours as needed for wheezing or shortness of breath.     aspirin EC 81 MG tablet Take 81 mg by mouth daily.     atorvastatin (LIPITOR) 80 MG tablet Take 1 tablet (80 mg total) by mouth every morning. 90 tablet 1   benzonatate (TESSALON) 100 MG capsule Take 1 capsule (100 mg total) by mouth 3 (three) times daily as needed for cough. 45 capsule 1   blood glucose meter kit and supplies KIT Dispense based on patient and insurance preference. Use up to four times daily as directed. 1 each 0   buPROPion [facility]  XL) 150 MG 24 hr tablet Take 150 mg by mouth daily.     Cholecalciferol (VITAMIN D3 PO) Take 1 tablet by mouth daily at 6 (six) AM.     hydrocortisone 2.5 % ointment Apply topically 2 (two) times daily. Apply to skin rash twice a day as needed for itch. 30 g 0   lidocaine-prilocaine (EMLA) cream Apply on the port. 30 -45 min  prior to port access. 30 g 3   losartan (COZAAR) 100 MG tablet Take 100 mg by mouth every evening.     metFORMIN (GLUCOPHAGE) 1000 MG tablet Take 1,000 mg by mouth daily with breakfast.     metoprolol succinate (TOPROL XL) 25 MG 24 hr tablet Take 1 tablet (25 mg total) by mouth daily. 30 tablet 3   ondansetron (ZOFRAN) 8 MG tablet One pill every 8 hours as needed for nausea/vomitting. 40 tablet 1   OXYGEN Inhale 2 L into the lungs at bedtime.     Nicotine 21-14-7 MG/24HR KIT  Place 1 patch onto the skin daily. (Patient not taking: Reported on 10/01/2022) 60 kit 1   prochlorperazine (COMPAZINE) 10 MG tablet TAKE 1 TABLET(10 MG) BY MOUTH EVERY 6 HOURS AS NEEDED FOR NAUSEA OR VOMITING (Patient not taking: Reported on 09/25/2022) 40 tablet 1   traZODone (DESYREL) 50 MG tablet Take 50 mg by mouth at bedtime. (Patient not taking: Reported on 06/26/2022)     No current facility-administered medications for this visit.   Facility-Administered Medications Ordered in Other Visits  Medication Dose Route Frequency Provider Last Rate Last Admin   0.9 %  sodium chloride infusion   Intravenous Once Donneta Romberg, Worthy Flank, MD       durvalumab (IMFINZI) 1,000 mg in sodium chloride 0.9 % 100 mL chemo infusion  10 mg/kg (Treatment Plan Recorded) Intravenous Once Louretta Shorten R, MD       heparin lock flush 100 UNIT/ML injection            heparin lock flush 100 UNIT/ML injection            heparin lock flush 100 unit/mL  500 Units Intracatheter Once PRN Earna Coder, MD          .  PHYSICAL EXAMINATION: ECOG PERFORMANCE STATUS: 1 - Symptomatic but completely ambulatory  Vitals:   10/01/22 1100  BP: (!) 150/72  Pulse: 76  Resp: 18  Temp: (!) 97 F (36.1 C)  SpO2: 91%     Filed Weights   10/01/22 1100  Weight: 216 lb 12.8 oz (98.3 kg)      Physical Exam Vitals and nursing note reviewed.  HENT:     Head: Normocephalic and atraumatic.     Mouth/Throat:     Pharynx: Oropharynx is clear.  Eyes:     Extraocular Movements: Extraocular movements intact.     Pupils: Pupils are equal, round, and reactive to light.  Cardiovascular:     Rate and Rhythm: Normal rate and regular rhythm.  Pulmonary:     Comments: Decreased breath sounds bilaterally.  Abdominal:     Palpations: Abdomen is soft.  Musculoskeletal:        General: Normal range of motion.     Cervical back: Normal range of motion.  Skin:    General: Skin is warm.  Neurological:      General: No focal deficit present.     Mental Status: He is alert and oriented to person, place, and time.  Psychiatric:  Behavior: Behavior normal.        Judgment: Judgment normal.      LABORATORY DATA:  I have reviewed the data as listed Lab Results  Component Value Date   WBC 4.1 10/01/2022   HGB 13.6 10/01/2022   HCT 43.8 10/01/2022   MCV 94.8 10/01/2022   PLT 157 10/01/2022   Recent Labs    09/03/22 0844 09/17/22 1252 10/01/22 1115  NA 140 139 137  K 4.1 4.1 3.9  CL 104 104 102  CO2 27 26 27   GLUCOSE 102* 95 101*  BUN 10 11 10   CREATININE 0.95 0.89 0.89  CALCIUM 8.8* 8.8* 8.5*  GFRNONAA >60 >60 >60  PROT 7.4 7.4 7.1  ALBUMIN 3.9 4.0 3.9  AST 21 18 19   ALT 19 17 17   ALKPHOS 87 92 86  BILITOT 0.3 0.7 0.6    RADIOGRAPHIC STUDIES: I have personally reviewed the radiological images as listed and agreed with the findings in the report. CT Chest W Contrast  Result Date: 09/14/2022 CLINICAL DATA:  Non-small cell lung cancer restaging, assess treatment response, ongoing chemotherapy * Tracking Code: BO * EXAM: CT CHEST WITH CONTRAST TECHNIQUE: Multidetector CT imaging of the chest was performed during intravenous contrast administration. RADIATION DOSE REDUCTION: This exam was performed according to the departmental dose-optimization program which includes automated exposure control, adjustment of the mA and/or kV according to patient size and/or use of iterative reconstruction technique. CONTRAST:  76mL OMNIPAQUE IOHEXOL 300 MG/ML  SOLN COMPARISON:  06/20/2022 FINDINGS: Cardiovascular: Right chest port catheter. Aortic atherosclerosis. Three-vessel coronary artery calcifications. Normal heart size. No pericardial effusion. Mediastinum/Nodes: Unchanged soft tissue thickening of the left hilum (series 2, image 76). Unchanged prominent subcentimeter mediastinal lymph nodes (series 2, image 54). Thyroid gland, trachea, and esophagus demonstrate no significant findings.  Lungs/Pleura: Severe centrilobular and paraseptal emphysema. Diffuse bilateral bronchial wall thickening. Slight interval decrease in size of a thick-walled cavitary lesion of the posterior right upper lobe measuring 1.7 x 1.5 cm (series 3, image 39). No significant change in an irregular opacity of the peripheral right upper lobe measuring 1.1 x 0.6 cm (series 3, image 47). New irregular heterogeneous opacity of the anterior right pulmonary apex measuring 2.6 x 2.6 cm (series 3, image 33). Similar appearance of extensive, clustered centrilobular and tree-in-bud nodularity in the bilateral lung bases (series 3, image 131). No pleural effusion or pneumothorax. Upper Abdomen: No acute abnormality. Multiple simple appearing benign bilateral renal cortical cysts, incompletely imaged. Unchanged fatty attenuation left adrenal nodule measuring 1.1 cm, not previously FDG avid and consistent with a benign adenoma (series 4, image 100). Musculoskeletal: No chest wall abnormality. No acute osseous findings. IMPRESSION: 1. Slight interval decrease in size of a thick-walled cavitary lesion of the posterior right upper lobe consistent with continued treatment response. 2. No significant change in an irregular opacity of the peripheral right upper lobe, which was new on prior examination and remains nonspecific. Attention on follow-up. 3. New irregular heterogeneous opacity of the anterior right pulmonary apex measuring 2.6 x 2.6 cm. This may be infectious or inflammatory a perhaps reflect developing radiation pneumonitis/fibrosis if there has been local radiation therapy in this vicinity. Attention on follow-up. 4. Similar appearance of extensive, clustered centrilobular and tree-in-bud nodularity in the bilateral lung bases, most suggestive of aspiration, which may be ongoing. 5. Unchanged soft tissue thickening of the left hilum, corresponding to previous site of abnormal FDG avidity. Continued attention on follow-up. 6.  Unchanged prominent subcentimeter mediastinal lymph nodes. 7.  Severe emphysema and diffuse bilateral bronchial wall thickening. 8. Coronary artery disease. Aortic Atherosclerosis (ICD10-I70.0) and Emphysema (ICD10-J43.9). Electronically Signed   By: Jearld Lesch M.D.   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  PET MAY 25th- Hypermetabolic cavitary mass of the right upper lobe; Hypermetabolic left hilar mass versus conglomerate hilar lymphadenopathy, possibly due to nodal metastatic disease or primary lung malignancy. NGS-F-ONE- PDL-1 0%; no targets**.  Currently status post with Carbo- Abraxane with RT [July 26th - 07/26; Taxol:allergy] weekly #6 chemotherapy [uly 25 th]. Right upper lobe mass -also SBRT [08/07- 05-06-2022]. DEC 28th 2023-  Interval decrease in size of the cavitary posterior right upper lobe pulmonary lesion; stable left hilar fullness [status post chemoradiation]; but new- RUL- 2.5 cm inflmmatory vs infectious- monitor for now. Patient currently on adjuvant immunotherapy with Durvalumab.    # patient will proceed with immunotherapy-Durvalumab.  Labs today reviewed;  acceptable for treatment today.  OCT 2023- TSH-WNL.  Stable.   # Constipation- sec to chemo- continue Miralax once or twice as needed. Stable.   # Smoking: Active smoker; quit smoking recently. OFF  nicotine patches. Stable.   # COPD [Dr.Fleming]:on home O2 lits-[place];  on proair; compliance with inhalerrs/nebs; cough is stable on  Tessalon pearls prn for cough. Stable.   # IV ACCESS: port-functional.   # Vaccination: s/p Flu shot patient is not taking his   # DISPOSITION: # Durvalumab today.  # follow up in 2 week- MD; labs-port- cbc/cmp; vit D 25-OH levels;  Durvalumab  -Dr.B      All questions were answered. The patient knows to call the clinic with any problems, questions or concerns.       Earna Coder, MD 10/01/2022 12:08 PM

## 2022-10-01 NOTE — Patient Instructions (Signed)
[facility] [place]  Discharge Instructions: Thank you for choosing [facility] to provide your oncology and hematology care.  If you have a lab appointment with the Cancer Center, please go directly to the Cancer Center and check in at the registration area.  Wear comfortable clothing and clothing appropriate for easy access to any Portacath or PICC line.   We strive to give you quality time with your provider. You may need to reschedule your appointment if you arrive late (15 or more minutes).  Arriving late affects you and other patients whose appointments are after yours.  Also, if you miss three or more appointments without notifying the office, you may be dismissed from the clinic at the provider's discretion.      For prescription refill requests, have your pharmacy contact our office and allow 72 hours for refills to be completed.    Today you received the following chemotherapy and/or immunotherapy agents Imfinzi        To help prevent nausea and vomiting after your treatment, we encourage you to take your nausea medication as directed.  BELOW ARE SYMPTOMS THAT SHOULD BE REPORTED IMMEDIATELY: *FEVER GREATER THAN 100.4 F (38 C) OR HIGHER *CHILLS OR SWEATING *NAUSEA AND VOMITING THAT IS NOT CONTROLLED WITH YOUR NAUSEA MEDICATION *UNUSUAL SHORTNESS OF BREATH *UNUSUAL BRUISING OR BLEEDING *URINARY PROBLEMS (pain or burning when urinating, or frequent urination) *BOWEL PROBLEMS (unusual diarrhea, constipation, pain near the anus) TENDERNESS IN MOUTH AND THROAT WITH OR WITHOUT PRESENCE OF ULCERS (sore throat, sores in mouth, or a toothache) UNUSUAL RASH, SWELLING OR PAIN  UNUSUAL VAGINAL DISCHARGE OR ITCHING   Items with * indicate a potential emergency and should be followed up as soon as possible or go to the Emergency Department if any problems should occur.  Please show the CHEMOTHERAPY ALERT CARD or IMMUNOTHERAPY ALERT CARD at check-in to  the Emergency Department and triage nurse.  Should you have questions after your visit or need to cancel or reschedule your appointment, please contact [facility] [place]  [phone] and follow the prompts.  Office hours are 8:00 a.m. to 4:30 p.m. Monday - Friday. Please note that voicemails left after 4:00 p.m. may not be returned until the following business day.  We are closed weekends and major holidays. You have access to a nurse at all times for urgent questions. Please call the main number to the clinic [phone] and follow the prompts.  For any non-urgent questions, you may also contact your provider using MyChart. We now offer e-Visits for anyone 16 and older to request care online for non-urgent symptoms. For details visit [email or web address].   Also download the MyChart app! Go to the app store, search "MyChart", open the app, select [place], and log in with your MyChart username and password.

## 2022-10-01 NOTE — Progress Notes (Signed)
Patient still having a productive cough.  Left side pain for past 2 days, 2-3/10 pain scale.

## 2022-10-02 LAB — T4: T4, Total: 12.4 ug/dL — ABNORMAL HIGH (ref 4.5–12.0)

## 2022-10-03 ENCOUNTER — Inpatient Hospital Stay: Payer: Medicare Other

## 2022-10-04 ENCOUNTER — Other Ambulatory Visit: Payer: Self-pay

## 2022-10-15 ENCOUNTER — Inpatient Hospital Stay: Payer: Medicare Other

## 2022-10-15 ENCOUNTER — Encounter: Payer: Self-pay | Admitting: Internal Medicine

## 2022-10-15 ENCOUNTER — Inpatient Hospital Stay (HOSPITAL_BASED_OUTPATIENT_CLINIC_OR_DEPARTMENT_OTHER): Payer: Medicare Other | Admitting: Internal Medicine

## 2022-10-15 VITALS — BP 159/79 | HR 71 | Temp 96.6°F | Resp 18 | Ht 69.0 in | Wt 212.5 lb

## 2022-10-15 DIAGNOSIS — C3411 Malignant neoplasm of upper lobe, right bronchus or lung: Secondary | ICD-10-CM

## 2022-10-15 DIAGNOSIS — E559 Vitamin D deficiency, unspecified: Secondary | ICD-10-CM

## 2022-10-15 DIAGNOSIS — Z5112 Encounter for antineoplastic immunotherapy: Secondary | ICD-10-CM | POA: Diagnosis not present

## 2022-10-15 LAB — CBC WITH DIFFERENTIAL/PLATELET
Abs Immature Granulocytes: 0.01 10*3/uL (ref 0.00–0.07)
Basophils Absolute: 0 10*3/uL (ref 0.0–0.1)
Basophils Relative: 1 %
Eosinophils Absolute: 0.2 10*3/uL (ref 0.0–0.5)
Eosinophils Relative: 5 %
HCT: 43.2 % (ref 39.0–52.0)
Hemoglobin: 13.4 g/dL (ref 13.0–17.0)
Immature Granulocytes: 0 %
Lymphocytes Relative: 19 %
Lymphs Abs: 0.8 10*3/uL (ref 0.7–4.0)
MCH: 28.8 pg (ref 26.0–34.0)
MCHC: 31 g/dL (ref 30.0–36.0)
MCV: 92.7 fL (ref 80.0–100.0)
Monocytes Absolute: 0.6 10*3/uL (ref 0.1–1.0)
Monocytes Relative: 15 %
Neutro Abs: 2.5 10*3/uL (ref 1.7–7.7)
Neutrophils Relative %: 60 %
Platelets: 165 10*3/uL (ref 150–400)
RBC: 4.66 MIL/uL (ref 4.22–5.81)
RDW: 13.2 % (ref 11.5–15.5)
WBC: 4.1 10*3/uL (ref 4.0–10.5)
nRBC: 0 % (ref 0.0–0.2)

## 2022-10-15 LAB — COMPREHENSIVE METABOLIC PANEL
ALT: 23 U/L (ref 0–44)
AST: 19 U/L (ref 15–41)
Albumin: 4 g/dL (ref 3.5–5.0)
Alkaline Phosphatase: 87 U/L (ref 38–126)
Anion gap: 8 (ref 5–15)
BUN: 12 mg/dL (ref 8–23)
CO2: 26 mmol/L (ref 22–32)
Calcium: 8.8 mg/dL — ABNORMAL LOW (ref 8.9–10.3)
Chloride: 103 mmol/L (ref 98–111)
Creatinine, Ser: 0.95 mg/dL (ref 0.61–1.24)
GFR, Estimated: >60 mL/min (ref >60)
Glucose, Bld: 95 mg/dL (ref 70–99)
Potassium: 3.8 mmol/L (ref 3.5–5.1)
Sodium: 137 mmol/L (ref 135–145)
Total Bilirubin: 0.5 mg/dL (ref 0.3–1.2)
Total Protein: 7.1 g/dL (ref 6.5–8.1)

## 2022-10-15 LAB — TSH: TSH: <0.01 u[IU]/mL — ABNORMAL LOW (ref 0.350–4.500)

## 2022-10-15 MED ORDER — HEPARIN SOD (PORK) LOCK FLUSH 100 UNIT/ML IV SOLN
500.0000 [IU] | Freq: Once | INTRAVENOUS | Status: DC | PRN
  Filled 2022-10-15: qty 5

## 2022-10-15 MED ORDER — HEPARIN SOD (PORK) LOCK FLUSH 100 UNIT/ML IV SOLN
INTRAVENOUS | Status: AC
  Administered 2022-10-15: 500 [IU]
  Filled 2022-10-15: qty 5

## 2022-10-15 MED ORDER — SODIUM CHLORIDE 0.9 % IV SOLN
10.0000 mg/kg | Freq: Once | INTRAVENOUS | Status: AC
  Administered 2022-10-15: 1000 mg via INTRAVENOUS
  Filled 2022-10-15: qty 20

## 2022-10-15 MED ORDER — HEPARIN SOD (PORK) LOCK FLUSH 100 UNIT/ML IV SOLN
500.0000 [IU] | Freq: Once | INTRAVENOUS | Status: DC
  Filled 2022-10-15: qty 5

## 2022-10-15 MED ORDER — SODIUM CHLORIDE 0.9% FLUSH
10.0000 mL | Freq: Once | INTRAVENOUS | Status: AC
  Administered 2022-10-15: 10 mL via INTRAVENOUS
  Filled 2022-10-15: qty 10

## 2022-10-15 MED ORDER — SODIUM CHLORIDE 0.9 % IV SOLN
Freq: Once | INTRAVENOUS | Status: AC
  Filled 2022-10-15: qty 250

## 2022-10-15 NOTE — Progress Notes (Signed)
Occasional constipation not requiring medication.  Stable SOBr on exertion.

## 2022-10-15 NOTE — Assessment & Plan Note (Addendum)
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  PET MAY 81RR- Hypermetabolic cavitary mass of the right upper lobe; Hypermetabolic left hilar mass versus conglomerate hilar lymphadenopathy, possibly due to nodal metastatic disease or primary lung malignancy. NGS-F-ONE- PDL-1 0%; no targets**.  Currently status post with Carbo- Abraxane with RT [facility] 26th - 07/26; Taxol:allergy] weekly #6 chemotherapy [uly 25 th]. Right upper lobe mass -also SBRT [08/07- 05-06-2022]. DEC 28th 2023-  Interval decrease in size of the cavitary posterior right upper lobe pulmonary lesion; stable left hilar fullness [status post chemoradiation]; but new- RUL- 2.5 cm inflmmatory vs infectious- monitor for now. Patient currently on adjuvant immunotherapy with Durvalumab.  Stable.  Will repeat imaging in end of March.   # patient will proceed with immunotherapy-Durvalumab.  Labs today reviewed;  acceptable for treatment today.    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  Likely initial thyroiditis phaseTSH [DEC 2023- < 0.1; T4= 12]- monitor for now closely   # Constipation- sec to chemo- continue Miralax/MOM once or twice as needed. Stable.   # Smoking: Active smoker; quit smoking recently. OFF  nicotine patches. stable.   # COPD [Dr.Fleming]:on home O2 lits-[place];  on proair; compliance with inhalerrs/nebs; cough is stable on  Tessalon pearls prn for cough. Stable.   # IV ACCESS: port-functional.   # Vaccination: s/p Flu shot.    # DISPOSITION: # Durvalumab today.  # follow up in 2 week- MD; labs-port- cbc/cmp; THS/T4- Durvalumab -Dr.B

## 2022-10-15 NOTE — Patient Instructions (Signed)
[facility]  Discharge Instructions: Thank you for choosing [facility] to provide your oncology and hematology care.  If you have a lab appointment with the [facility], please go directly to the [facility] and check in at the registration area.  Wear comfortable clothing and clothing appropriate for easy access to any Portacath or PICC line.   We strive to give you quality time with your provider. You may need to reschedule your appointment if you arrive late (15 or more minutes).  Arriving late affects you and other patients whose appointments are after yours.  Also, if you miss three or more appointments without notifying the office, you may be dismissed from the clinic at the provider's discretion.      For prescription refill requests, have your pharmacy contact our office and allow 72 hours for refills to be completed.    Today you received the following chemotherapy and/or immunotherapy agents Imfinzi      To help prevent nausea and vomiting after your treatment, we encourage you to take your nausea medication as directed.  BELOW ARE SYMPTOMS THAT SHOULD BE REPORTED IMMEDIATELY: *FEVER GREATER THAN 100.4 F (38 C) OR HIGHER *CHILLS OR SWEATING *NAUSEA AND VOMITING THAT IS NOT CONTROLLED WITH YOUR NAUSEA MEDICATION *UNUSUAL SHORTNESS OF BREATH *UNUSUAL BRUISING OR BLEEDING *URINARY PROBLEMS (pain or burning when urinating, or frequent urination) *BOWEL PROBLEMS (unusual diarrhea, constipation, pain near the anus) TENDERNESS IN MOUTH AND THROAT WITH OR WITHOUT PRESENCE OF ULCERS (sore throat, sores in mouth, or a toothache) UNUSUAL RASH, SWELLING OR PAIN  UNUSUAL VAGINAL DISCHARGE OR ITCHING   Items with * indicate a potential emergency and should be followed up as soon as possible or go to the Emergency Department if any problems should occur.  Please show the CHEMOTHERAPY ALERT CARD or IMMUNOTHERAPY ALERT CARD at check-in to  the Emergency Department and triage nurse.  Should you have questions after your visit or need to cancel or reschedule your appointment, please contact [facility]  [phone] and follow the prompts.  Office hours are 8:00 a.m. to 4:30 p.m. Monday - Friday. Please note that voicemails left after 4:00 p.m. may not be returned until the following business day.  We are closed weekends and major holidays. You have access to a nurse at all times for urgent questions. Please call the main number to the clinic [phone] and follow the prompts.  For any non-urgent questions, you may also contact your provider using MyChart. We now offer e-Visits for anyone 45 and older to request care online for non-urgent symptoms. For details visit [email or web address].   Also download the MyChart app! Go to the app store, search "MyChart", open the app, select [facility], and log in with your MyChart username and password.

## 2022-10-15 NOTE — Progress Notes (Signed)
[facility] NOTE  Patient Care Team: [facility] as PCP - General Telford Nab, RN as Oncology Nurse Navigator Cammie Sickle, MD as Consulting Physician (Oncology)  CHIEF COMPLAINTS/PURPOSE OF CONSULTATION: lung cancer  #  Oncology History Overview Note  IMPRESSION: 1. Thick-walled cavitary lesion of the posterior right upper lobe abutting the major fissure measuring 3.3 x 3.3 cm, highly concerning for primary lung malignancy or metastasis. 2. Although evaluation is limited by lack of intravenous contrast, suspect significant left hilar lymphadenopathy or mass, with apparent obstruction of the left upper lobe bronchus and severe narrowing of the left lower lobe bronchus. This finding is likewise highly concerning for primary lung malignancy or alternately nodal metastasis. Contrast enhanced CT may be helpful to more clearly evaluate the hilum. 3. There may be additional right hilar lymphadenopathy, again assessment limited by noncontrast CT. 4. Scattered heterogeneous and ground-glass airspace opacity throughout the upper lobes and right lower lobe, possibly reflecting postobstructive airspace disease in the left lung generally nonspecific. 5. Emphysema. 6. Coronary artery disease.   Aortic Atherosclerosis (ICD10-I70.0) and Emphysema (ICD10-J43.9).     Electronically Signed   By: Delanna Ahmadi M.D.   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  Patient also status post SBRT to the right lung nodule.   # CAD; COPD [Dr.Fleming- O2 none now]; DM; HTN   Malignant neoplasm of upper lobe of right lung ([facility])  02/01/2022 Initial Diagnosis    Malignant neoplasm of upper lobe of right lung ([facility])   02/01/2022 Cancer Staging   Staging form: Lung, AJCC 8th Edition - Clinical: Stage IIIB (cT2a, cN3, cM0) - Signed by Cammie Sickle, MD on 02/01/2022   02/19/2022 - 04/09/2022 Chemotherapy   Patient is on Treatment Plan : LUNG Carboplatin / Paclitaxel + XRT q7d     06/26/2022 -  Chemotherapy   Patient is on Treatment Plan : LUNG Durvalumab (10) q14d       HISTORY OF PRESENTING ILLNESS: Alone.  Ambulating independently.  Louis Sullivan 72 y.o.  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   Occasional constipation not requiring medication. Stable SOBr on exertion.  Patient still having a productive mild cough.  Improved on Tessalon Perles as recommended.    No hemoptysis.  Denies any worsening shortness of breath.  No fever no chills.   Review of Systems  Constitutional:  Negative for chills, diaphoresis, fever, malaise/fatigue and weight loss.  HENT:  Negative for nosebleeds and sore throat.   Eyes:  Negative for double vision.  Respiratory:  Positive for cough and shortness of breath. Negative for hemoptysis, sputum production and wheezing.   Cardiovascular:  Negative for chest pain, palpitations, orthopnea and leg swelling.  Gastrointestinal:  Negative for abdominal pain, blood in stool, constipation, diarrhea, heartburn, melena, nausea and vomiting.  Genitourinary:  Negative for dysuria, frequency and urgency.  Musculoskeletal:  Positive for joint pain. Negative for back pain.  Skin: Negative.  Negative for itching and rash.  Neurological:  Negative for dizziness, tingling, focal weakness, weakness and headaches.  Endo/Heme/Allergies:  Does not bruise/bleed easily.  Psychiatric/Behavioral:  Negative for depression. The patient is not  nervous/anxious and does not have insomnia.      MEDICAL HISTORY:  Past  Medical History:  Diagnosis Date   COPD (chronic obstructive pulmonary disease) ([facility])    Coronary artery disease    1 stent placed in about 2008   Diabetes mellitus without complication (HCC)    Dyspnea    Headache    migraines   Hyperlipidemia    Hypertension    Pneumonia 12/2021   hospitalized at [facility]: Past Surgical History:  Procedure Laterality Date   CARDIAC CATHETERIZATION     COLONOSCOPY     IR IMAGING GUIDED PORT INSERTION  02/12/2022   VIDEO BRONCHOSCOPY WITH ENDOBRONCHIAL NAVIGATION N/A 01/23/2022   Procedure: VIDEO BRONCHOSCOPY WITH ENDOBRONCHIAL NAVIGATION;  Surgeon: Ottie Glazier, MD;  Location: [facility] ORS;  Service: Thoracic;  Laterality: N/A;   VIDEO BRONCHOSCOPY WITH ENDOBRONCHIAL ULTRASOUND N/A 01/23/2022   Procedure: VIDEO BRONCHOSCOPY WITH ENDOBRONCHIAL ULTRASOUND;  Surgeon: Ottie Glazier, MD;  Location: [facility] ORS;  Service: Thoracic;  Laterality: N/A;    SOCIAL HISTORY: Social History   Socioeconomic History   Marital status: Widowed    Spouse name: Not on file   Number of children: Not on file   Years of education: Not on file   Highest education level: Not on file  Occupational History   Not on file  Tobacco Use   Smoking status: Former    Packs/day: 1.00    Years: 20.00    Total pack years: 20.00    Types: Cigarettes    Quit date: 02/11/2022    Years since quitting: 0.6   Smokeless tobacco: Never   Tobacco comments:    Request to MD for Nicotine patch to help him quit smoking.  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 Lives in [place] self. Son passed in 2021;MI wife passed away in 1990s/aneurysm; daughter in [place].    Social Determinants of Health   Financial Resource Strain: Not on file  Food  Insecurity: Not on file  Transportation Needs: Not on file  Physical Activity: Not on file  Stress: Not on file  Social Connections: Not on file  Intimate Partner Violence: Not on file    FAMILY HISTORY: Family History  Problem Relation Age of Onset   Throat cancer Brother     ALLERGIES:  is allergic to paclitaxel.  MEDICATIONS:  Current Outpatient Medications  Medication Sig Dispense Refill   albuterol (PROVENTIL) (5 MG/ML) 0.5% nebulizer solution Take 2.5 mg by nebulization every morning.     albuterol (VENTOLIN HFA) 108 (90 Base) MCG/ACT inhaler Inhale 2 puffs into the lungs every 4 (four) hours as needed for wheezing or shortness of breath.     aspirin EC 81 MG tablet Take 81 mg by mouth daily.     atorvastatin (LIPITOR) 80 MG tablet Take 1 tablet (80 mg total) by mouth every morning. 90 tablet 1   benzonatate (TESSALON) 100 MG capsule Take 1 capsule (100 mg total) by mouth 3 (three) times daily as needed for cough. 45 capsule 1   blood glucose meter kit and supplies KIT Dispense based on patient and insurance preference. Use up to four times daily as directed. 1 each 0   buPROPion (WELLBUTRIN XL) 150  MG 24 hr tablet Take 150 mg by mouth daily.     Cholecalciferol (VITAMIN D3 PO) Take 1 tablet by mouth daily at 6 (six) AM.     hydrocortisone 2.5 % ointment Apply topically 2 (two) times daily. Apply to skin rash twice a day as needed for itch. 30 g 0   lidocaine-prilocaine (EMLA) cream Apply on the port. 30 -45 min  prior to port access. 30 g 3   losartan (COZAAR) 100 MG tablet Take 100 mg by mouth every evening.     metFORMIN (GLUCOPHAGE) 1000 MG tablet Take 1,000 mg by mouth daily with breakfast.     metoprolol succinate (TOPROL XL) 25 MG 24 hr tablet Take 1 tablet (25 mg total) by mouth daily. 30 tablet 3   ondansetron (ZOFRAN) 8 MG tablet One pill every 8 hours as needed for nausea/vomitting. 40 tablet 1   OXYGEN Inhale 2 L into the lungs at bedtime.     POLYETHYLENE GLYCOL  3350 PO Take 1 Container by mouth daily as needed.     Nicotine 21-14-7 MG/24HR KIT Place 1 patch onto the skin daily. (Patient not taking: Reported on 10/01/2022) 60 kit 1   prochlorperazine (COMPAZINE) 10 MG tablet TAKE 1 TABLET(10 MG) BY MOUTH EVERY 6 HOURS AS NEEDED FOR NAUSEA OR VOMITING (Patient not taking: Reported on 09/25/2022) 40 tablet 1   traZODone (DESYREL) 50 MG tablet Take 50 mg by mouth at bedtime. (Patient not taking: Reported on 06/26/2022)     No current facility-administered medications for this visit.   Facility-Administered Medications Ordered in Other Visits  Medication Dose Route Frequency Provider Last Rate Last Admin   durvalumab (IMFINZI) 1,000 mg in sodium chloride 0.9 % 100 mL chemo infusion  10 mg/kg (Treatment Plan Recorded) Intravenous Once Charlaine Dalton R, MD 120 mL/hr at 10/15/22 1011 1,000 mg at 10/15/22 1011   heparin lock flush 100 UNIT/ML injection            heparin lock flush 100 UNIT/ML injection            heparin lock flush 100 unit/mL  500 Units Intravenous Once Charlaine Dalton R, MD       heparin lock flush 100 unit/mL  500 Units Intracatheter Once PRN Cammie Sickle, MD          .  PHYSICAL EXAMINATION: ECOG PERFORMANCE STATUS: 1 - Symptomatic but completely ambulatory  Vitals:   10/15/22 0800  BP: (!) 159/79  Pulse: 71  Resp: 18  Temp: (!) 96.6 F (35.9 C)  SpO2: 93%     Filed Weights   10/15/22 0800  Weight: 212 lb 8 oz (96.4 kg)      Physical Exam Vitals and nursing note reviewed.  HENT:     Head: Normocephalic and atraumatic.     Mouth/Throat:     Pharynx: Oropharynx is clear.  Eyes:     Extraocular Movements: Extraocular movements intact.     Pupils: Pupils are equal, round, and reactive to light.  Cardiovascular:     Rate and Rhythm: Normal rate and regular rhythm.  Pulmonary:     Comments: Decreased breath sounds bilaterally.  Abdominal:     Palpations: Abdomen is soft.  Musculoskeletal:         General: Normal range of motion.     Cervical back: Normal range of motion.  Skin:    General: Skin is warm.  Neurological:     General: No focal deficit present.  Mental Status: He is alert and oriented to person, place, and time.  Psychiatric:        Behavior: Behavior normal.        Judgment: Judgment normal.      LABORATORY DATA:  I have reviewed the data as listed Lab Results  Component Value Date   WBC 4.1 10/15/2022   HGB 13.4 10/15/2022   HCT 43.2 10/15/2022   MCV 92.7 10/15/2022   PLT 165 10/15/2022   Recent Labs    09/17/22 1252 10/01/22 1115 10/15/22 0837  NA 139 137 137  K 4.1 3.9 3.8  CL 104 102 103  CO2 26 27 26   GLUCOSE 95 101* 95  BUN 11 10 12   CREATININE 0.89 0.89 0.95  CALCIUM 8.8* 8.5* 8.8*  GFRNONAA >60 >60 >60  PROT 7.4 7.1 7.1  ALBUMIN 4.0 3.9 4.0  AST 18 19 19   ALT 17 17 23   ALKPHOS 92 86 87  BILITOT 0.7 0.6 0.5    RADIOGRAPHIC STUDIES: I have personally reviewed the radiological images as listed and agreed with the findings in the report. No results found.  ASSESSMENT & PLAN:   Malignant neoplasm of upper lobe of right lung (HCC) # Clinical stage III vs Stage IV [left hilar malignancy with contralateral right lung nodule] -right upper lobe lung cancer non-small cell favor-squamous cell; Left Hilar- Bx- squamous cell.  PET MAY 00FV- Hypermetabolic cavitary mass of the right upper lobe; Hypermetabolic left hilar mass versus conglomerate hilar lymphadenopathy, possibly due to nodal metastatic disease or primary lung malignancy. NGS-F-ONE- PDL-1 0%; no targets**.  Currently status post with Carbo- Abraxane with RT [facility] 26th - 07/26; Taxol:allergy] weekly #6 chemotherapy [uly 25 th]. Right upper lobe mass -also SBRT [08/07- 05-06-2022]. DEC 28th 2023-  Interval decrease in size of the cavitary posterior right upper lobe pulmonary lesion; stable left hilar fullness [status post chemoradiation]; but new- RUL- 2.5 cm inflmmatory vs infectious-  monitor for now. Patient currently on adjuvant immunotherapy with Durvalumab.  Stable.  Will repeat imaging in end of March.   # patient will proceed with immunotherapy-Durvalumab.  Labs today reviewed;  acceptable for treatment today.    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  Likely initial thyroiditis phaseTSH [DEC 2023- < 0.1; T4= 12]- monitor for now closely   # Constipation- sec to chemo- continue Miralax/MOM once or twice as needed. Stable.   # Smoking: Active smoker; quit smoking recently. OFF  nicotine patches. stable.   # COPD [Dr.Fleming]:on home O2 lits-[place];  on proair; compliance with inhalerrs/nebs; cough is stable on  Tessalon pearls prn for cough. Stable.   # IV ACCESS: port-functional.   # Vaccination: s/p Flu shot.    # DISPOSITION: # Durvalumab today.  # follow up in 2 week- MD; labs-port- cbc/cmp; THS/T4- Durvalumab -Dr.B      All questions were answered. The patient knows to call the clinic with any problems, questions or concerns.       Cammie Sickle, MD 10/15/2022 10:19 AM

## 2022-10-16 LAB — MISC LABCORP TEST (SEND OUT): Labcorp test code: 81950

## 2022-10-17 LAB — T4: T4, Total: 10.8 ug/dL (ref 4.5–12.0)

## 2022-10-24 ENCOUNTER — Inpatient Hospital Stay (HOSPITAL_COMMUNITY): Payer: Medicare Other

## 2022-10-24 ENCOUNTER — Emergency Department (HOSPITAL_COMMUNITY): Payer: Medicare Other

## 2022-10-24 ENCOUNTER — Inpatient Hospital Stay (HOSPITAL_COMMUNITY)
Admission: EM | Admit: 2022-10-24 | Discharge: 2022-11-15 | DRG: 871 | Disposition: E | Payer: Medicare Other | Attending: Internal Medicine | Admitting: Internal Medicine

## 2022-10-24 ENCOUNTER — Other Ambulatory Visit: Payer: Self-pay

## 2022-10-24 DIAGNOSIS — D65 Disseminated intravascular coagulation [defibrination syndrome]: Secondary | ICD-10-CM | POA: Diagnosis present

## 2022-10-24 DIAGNOSIS — I469 Cardiac arrest, cause unspecified: Secondary | ICD-10-CM | POA: Diagnosis present

## 2022-10-24 DIAGNOSIS — J449 Chronic obstructive pulmonary disease, unspecified: Secondary | ICD-10-CM | POA: Diagnosis present

## 2022-10-24 DIAGNOSIS — Z79899 Other long term (current) drug therapy: Secondary | ICD-10-CM

## 2022-10-24 DIAGNOSIS — D638 Anemia in other chronic diseases classified elsewhere: Secondary | ICD-10-CM | POA: Diagnosis present

## 2022-10-24 DIAGNOSIS — I2109 ST elevation (STEMI) myocardial infarction involving other coronary artery of anterior wall: Secondary | ICD-10-CM | POA: Diagnosis present

## 2022-10-24 DIAGNOSIS — E11649 Type 2 diabetes mellitus with hypoglycemia without coma: Secondary | ICD-10-CM | POA: Diagnosis present

## 2022-10-24 DIAGNOSIS — C3411 Malignant neoplasm of upper lobe, right bronchus or lung: Secondary | ICD-10-CM | POA: Diagnosis present

## 2022-10-24 DIAGNOSIS — E1165 Type 2 diabetes mellitus with hyperglycemia: Secondary | ICD-10-CM | POA: Diagnosis present

## 2022-10-24 DIAGNOSIS — J690 Pneumonitis due to inhalation of food and vomit: Secondary | ICD-10-CM | POA: Diagnosis present

## 2022-10-24 DIAGNOSIS — R579 Shock, unspecified: Secondary | ICD-10-CM | POA: Diagnosis not present

## 2022-10-24 DIAGNOSIS — Z7189 Other specified counseling: Secondary | ICD-10-CM

## 2022-10-24 DIAGNOSIS — G9341 Metabolic encephalopathy: Secondary | ICD-10-CM | POA: Diagnosis present

## 2022-10-24 DIAGNOSIS — Z87891 Personal history of nicotine dependence: Secondary | ICD-10-CM

## 2022-10-24 DIAGNOSIS — Z888 Allergy status to other drugs, medicaments and biological substances status: Secondary | ICD-10-CM

## 2022-10-24 DIAGNOSIS — Z808 Family history of malignant neoplasm of other organs or systems: Secondary | ICD-10-CM

## 2022-10-24 DIAGNOSIS — J9602 Acute respiratory failure with hypercapnia: Secondary | ICD-10-CM

## 2022-10-24 DIAGNOSIS — N170 Acute kidney failure with tubular necrosis: Secondary | ICD-10-CM | POA: Diagnosis present

## 2022-10-24 DIAGNOSIS — J9601 Acute respiratory failure with hypoxia: Secondary | ICD-10-CM | POA: Diagnosis not present

## 2022-10-24 DIAGNOSIS — Z1152 Encounter for screening for COVID-19: Secondary | ICD-10-CM | POA: Diagnosis not present

## 2022-10-24 DIAGNOSIS — I2489 Other forms of acute ischemic heart disease: Secondary | ICD-10-CM | POA: Diagnosis present

## 2022-10-24 DIAGNOSIS — R451 Restlessness and agitation: Secondary | ICD-10-CM | POA: Diagnosis present

## 2022-10-24 DIAGNOSIS — I10 Essential (primary) hypertension: Secondary | ICD-10-CM | POA: Diagnosis present

## 2022-10-24 DIAGNOSIS — E875 Hyperkalemia: Secondary | ICD-10-CM

## 2022-10-24 DIAGNOSIS — Z66 Do not resuscitate: Secondary | ICD-10-CM | POA: Diagnosis not present

## 2022-10-24 DIAGNOSIS — A419 Sepsis, unspecified organism: Principal | ICD-10-CM | POA: Diagnosis present

## 2022-10-24 DIAGNOSIS — E874 Mixed disorder of acid-base balance: Secondary | ICD-10-CM | POA: Diagnosis present

## 2022-10-24 DIAGNOSIS — R6521 Severe sepsis with septic shock: Secondary | ICD-10-CM | POA: Diagnosis present

## 2022-10-24 DIAGNOSIS — J189 Pneumonia, unspecified organism: Secondary | ICD-10-CM | POA: Diagnosis present

## 2022-10-24 DIAGNOSIS — Z7982 Long term (current) use of aspirin: Secondary | ICD-10-CM

## 2022-10-24 DIAGNOSIS — E785 Hyperlipidemia, unspecified: Secondary | ICD-10-CM | POA: Diagnosis present

## 2022-10-24 DIAGNOSIS — R4182 Altered mental status, unspecified: Secondary | ICD-10-CM

## 2022-10-24 DIAGNOSIS — K7200 Acute and subacute hepatic failure without coma: Secondary | ICD-10-CM

## 2022-10-24 DIAGNOSIS — G931 Anoxic brain damage, not elsewhere classified: Secondary | ICD-10-CM | POA: Diagnosis present

## 2022-10-24 DIAGNOSIS — R34 Anuria and oliguria: Secondary | ICD-10-CM | POA: Diagnosis present

## 2022-10-24 DIAGNOSIS — I2510 Atherosclerotic heart disease of native coronary artery without angina pectoris: Secondary | ICD-10-CM | POA: Diagnosis present

## 2022-10-24 DIAGNOSIS — N179 Acute kidney failure, unspecified: Secondary | ICD-10-CM

## 2022-10-24 DIAGNOSIS — Z7984 Long term (current) use of oral hypoglycemic drugs: Secondary | ICD-10-CM

## 2022-10-24 LAB — URINALYSIS, ROUTINE W REFLEX MICROSCOPIC
Bilirubin Urine: NEGATIVE
Glucose, UA: NEGATIVE mg/dL
Ketones, ur: NEGATIVE mg/dL
Nitrite: NEGATIVE
Protein, ur: 100 mg/dL — AB
Specific Gravity, Urine: 1.019 (ref 1.005–1.030)
WBC, UA: >50 WBC/hpf (ref 0–5)
pH: 5 (ref 5.0–8.0)

## 2022-10-24 LAB — COMPREHENSIVE METABOLIC PANEL
ALT: 704 U/L — ABNORMAL HIGH (ref 0–44)
ALT: 1553 U/L — ABNORMAL HIGH (ref 0–44)
AST: 1107 U/L — ABNORMAL HIGH (ref 15–41)
AST: 2959 U/L — ABNORMAL HIGH (ref 15–41)
Albumin: 1.6 g/dL — ABNORMAL LOW (ref 3.5–5.0)
Albumin: 1.7 g/dL — ABNORMAL LOW (ref 3.5–5.0)
Alkaline Phosphatase: 244 U/L — ABNORMAL HIGH (ref 38–126)
Alkaline Phosphatase: 348 U/L — ABNORMAL HIGH (ref 38–126)
Anion gap: 28 — ABNORMAL HIGH (ref 5–15)
Anion gap: 35 — ABNORMAL HIGH (ref 5–15)
BUN: 54 mg/dL — ABNORMAL HIGH (ref 8–23)
BUN: 55 mg/dL — ABNORMAL HIGH (ref 8–23)
CO2: 14 mmol/L — ABNORMAL LOW (ref 22–32)
CO2: 11 mmol/L — ABNORMAL LOW (ref 22–32)
Calcium: 9.4 mg/dL (ref 8.9–10.3)
Calcium: 8 mg/dL — ABNORMAL LOW (ref 8.9–10.3)
Chloride: 99 mmol/L (ref 98–111)
Chloride: 92 mmol/L — ABNORMAL LOW (ref 98–111)
Creatinine, Ser: 6.32 mg/dL — ABNORMAL HIGH (ref 0.61–1.24)
Creatinine, Ser: 6.7 mg/dL — ABNORMAL HIGH (ref 0.61–1.24)
GFR, Estimated: 9 mL/min — ABNORMAL LOW (ref >60)
GFR, Estimated: 8 mL/min — ABNORMAL LOW (ref >60)
Glucose, Bld: 396 mg/dL — ABNORMAL HIGH (ref 70–99)
Glucose, Bld: 406 mg/dL — ABNORMAL HIGH (ref 70–99)
Potassium: 6.7 mmol/L (ref 3.5–5.1)
Potassium: >7.5 mmol/L (ref 3.5–5.1)
Sodium: 141 mmol/L (ref 135–145)
Sodium: 138 mmol/L (ref 135–145)
Total Bilirubin: 1 mg/dL (ref 0.3–1.2)
Total Bilirubin: 1.9 mg/dL — ABNORMAL HIGH (ref 0.3–1.2)
Total Protein: 3.7 g/dL — ABNORMAL LOW (ref 6.5–8.1)
Total Protein: 4 g/dL — ABNORMAL LOW (ref 6.5–8.1)

## 2022-10-24 LAB — POCT I-STAT 7, (LYTES, BLD GAS, ICA,H+H)
Acid-base deficit: 19 mmol/L — ABNORMAL HIGH (ref 0.0–2.0)
Acid-base deficit: 20 mmol/L — ABNORMAL HIGH (ref 0.0–2.0)
Acid-base deficit: 9 mmol/L — ABNORMAL HIGH (ref 0.0–2.0)
Bicarbonate: 11.2 mmol/L — ABNORMAL LOW (ref 20.0–28.0)
Bicarbonate: 10.6 mmol/L — ABNORMAL LOW (ref 20.0–28.0)
Bicarbonate: 19 mmol/L — ABNORMAL LOW (ref 20.0–28.0)
Calcium, Ion: 1.01 mmol/L — ABNORMAL LOW (ref 1.15–1.40)
Calcium, Ion: 0.98 mmol/L — ABNORMAL LOW (ref 1.15–1.40)
Calcium, Ion: 0.76 mmol/L — CL (ref 1.15–1.40)
HCT: 34 % — ABNORMAL LOW (ref 39.0–52.0)
HCT: 23 % — ABNORMAL LOW (ref 39.0–52.0)
HCT: 21 % — ABNORMAL LOW (ref 39.0–52.0)
Hemoglobin: 11.6 g/dL — ABNORMAL LOW (ref 13.0–17.0)
Hemoglobin: 7.8 g/dL — ABNORMAL LOW (ref 13.0–17.0)
Hemoglobin: 7.1 g/dL — ABNORMAL LOW (ref 13.0–17.0)
O2 Saturation: 97 %
O2 Saturation: 93 %
O2 Saturation: 89 %
Patient temperature: 99.8
Patient temperature: 37
Patient temperature: 36.3
Potassium: 7.1 mmol/L (ref 3.5–5.1)
Potassium: 8.1 mmol/L (ref 3.5–5.1)
Potassium: 8.1 mmol/L (ref 3.5–5.1)
Sodium: 134 mmol/L — ABNORMAL LOW (ref 135–145)
Sodium: 135 mmol/L (ref 135–145)
Sodium: 143 mmol/L (ref 135–145)
TCO2: 12 mmol/L — ABNORMAL LOW (ref 22–32)
TCO2: 12 mmol/L — ABNORMAL LOW (ref 22–32)
TCO2: 21 mmol/L — ABNORMAL LOW (ref 22–32)
pCO2 arterial: 42.6 mmHg (ref 32–48)
pCO2 arterial: 48.7 mmHg — ABNORMAL HIGH (ref 32–48)
pCO2 arterial: 50.9 mmHg — ABNORMAL HIGH (ref 32–48)
pH, Arterial: 7.033 — CL (ref 7.35–7.45)
pH, Arterial: 6.944 — CL (ref 7.35–7.45)
pH, Arterial: 7.177 — CL (ref 7.35–7.45)
pO2, Arterial: 138 mmHg — ABNORMAL HIGH (ref 83–108)
pO2, Arterial: 106 mmHg (ref 83–108)
pO2, Arterial: 68 mmHg — ABNORMAL LOW (ref 83–108)

## 2022-10-24 LAB — CBG MONITORING, ED
Glucose-Capillary: 383 mg/dL — ABNORMAL HIGH (ref 70–99)
Glucose-Capillary: <10 mg/dL — CL (ref 70–99)

## 2022-10-24 LAB — CBC WITH DIFFERENTIAL/PLATELET
Abs Immature Granulocytes: 0.3 10*3/uL — ABNORMAL HIGH (ref 0.00–0.07)
Band Neutrophils: 3 %
Basophils Absolute: 0 10*3/uL (ref 0.0–0.1)
Basophils Relative: 0 %
Eosinophils Absolute: 0.1 10*3/uL (ref 0.0–0.5)
Eosinophils Relative: 1 %
HCT: 33.5 % — ABNORMAL LOW (ref 39.0–52.0)
Hemoglobin: 9.6 g/dL — ABNORMAL LOW (ref 13.0–17.0)
Lymphocytes Relative: 27 %
Lymphs Abs: 2.3 10*3/uL (ref 0.7–4.0)
MCH: 29.4 pg (ref 26.0–34.0)
MCHC: 28.7 g/dL — ABNORMAL LOW (ref 30.0–36.0)
MCV: 102.8 fL — ABNORMAL HIGH (ref 80.0–100.0)
Metamyelocytes Relative: 1 %
Monocytes Absolute: 0.6 10*3/uL (ref 0.1–1.0)
Monocytes Relative: 7 %
Myelocytes: 2 %
Neutro Abs: 5.3 10*3/uL (ref 1.7–7.7)
Neutrophils Relative %: 59 %
Platelets: 45 10*3/uL — ABNORMAL LOW (ref 150–400)
RBC: 3.26 MIL/uL — ABNORMAL LOW (ref 4.22–5.81)
RDW: 14.2 % (ref 11.5–15.5)
WBC: 8.5 10*3/uL (ref 4.0–10.5)
nRBC: 0.4 % — ABNORMAL HIGH (ref 0.0–0.2)

## 2022-10-24 LAB — GLUCOSE, CAPILLARY
Glucose-Capillary: 211 mg/dL — ABNORMAL HIGH (ref 70–99)
Glucose-Capillary: 245 mg/dL — ABNORMAL HIGH (ref 70–99)
Glucose-Capillary: 363 mg/dL — ABNORMAL HIGH (ref 70–99)
Glucose-Capillary: 373 mg/dL — ABNORMAL HIGH (ref 70–99)
Glucose-Capillary: 403 mg/dL — ABNORMAL HIGH (ref 70–99)
Glucose-Capillary: 587 mg/dL (ref 70–99)

## 2022-10-24 LAB — I-STAT CHEM 8, ED
BUN: 51 mg/dL — ABNORMAL HIGH (ref 8–23)
Calcium, Ion: 0.95 mmol/L — ABNORMAL LOW (ref 1.15–1.40)
Chloride: 108 mmol/L (ref 98–111)
Creatinine, Ser: 6.1 mg/dL — ABNORMAL HIGH (ref 0.61–1.24)
Glucose, Bld: 32 mg/dL — CL (ref 70–99)
HCT: 28 % — ABNORMAL LOW (ref 39.0–52.0)
Hemoglobin: 9.5 g/dL — ABNORMAL LOW (ref 13.0–17.0)
Potassium: 5.6 mmol/L — ABNORMAL HIGH (ref 3.5–5.1)
Sodium: 139 mmol/L (ref 135–145)
TCO2: 16 mmol/L — ABNORMAL LOW (ref 22–32)

## 2022-10-24 LAB — LACTIC ACID, PLASMA
Lactic Acid, Venous: >9 mmol/L (ref 0.5–1.9)
Lactic Acid, Venous: >9 mmol/L (ref 0.5–1.9)
Lactic Acid, Venous: >9 mmol/L (ref 0.5–1.9)

## 2022-10-24 LAB — RAPID URINE DRUG SCREEN, HOSP PERFORMED
Amphetamines: NOT DETECTED
Barbiturates: NOT DETECTED
Benzodiazepines: NOT DETECTED
Cocaine: NOT DETECTED
Opiates: NOT DETECTED
Tetrahydrocannabinol: NOT DETECTED

## 2022-10-24 LAB — RESP PANEL BY RT-PCR (RSV, FLU A&B, COVID)  RVPGX2
Influenza A by PCR: NEGATIVE
Influenza B by PCR: NEGATIVE
Resp Syncytial Virus by PCR: NEGATIVE
SARS Coronavirus 2 by RT PCR: NEGATIVE

## 2022-10-24 LAB — CBC
HCT: 31.1 % — ABNORMAL LOW (ref 39.0–52.0)
Hemoglobin: 8.9 g/dL — ABNORMAL LOW (ref 13.0–17.0)
MCH: 29.4 pg (ref 26.0–34.0)
MCHC: 28.6 g/dL — ABNORMAL LOW (ref 30.0–36.0)
MCV: 102.6 fL — ABNORMAL HIGH (ref 80.0–100.0)
Platelets: 38 10*3/uL — ABNORMAL LOW (ref 150–400)
RBC: 3.03 MIL/uL — ABNORMAL LOW (ref 4.22–5.81)
RDW: 14.7 % (ref 11.5–15.5)
WBC: 10.3 10*3/uL (ref 4.0–10.5)
nRBC: 0.5 % — ABNORMAL HIGH (ref 0.0–0.2)

## 2022-10-24 LAB — COOXEMETRY PANEL
Carboxyhemoglobin: 2.6 % — ABNORMAL HIGH (ref 0.5–1.5)
Methemoglobin: <0.7 % (ref 0.0–1.5)
O2 Saturation: 78.9 %
Total hemoglobin: 9.2 g/dL — ABNORMAL LOW (ref 12.0–16.0)

## 2022-10-24 LAB — I-STAT ARTERIAL BLOOD GAS, ED
Acid-base deficit: 15 mmol/L — ABNORMAL HIGH (ref 0.0–2.0)
Bicarbonate: 19 mmol/L — ABNORMAL LOW (ref 20.0–28.0)
Calcium, Ion: 1.35 mmol/L (ref 1.15–1.40)
HCT: 29 % — ABNORMAL LOW (ref 39.0–52.0)
Hemoglobin: 9.9 g/dL — ABNORMAL LOW (ref 13.0–17.0)
O2 Saturation: 93 %
Patient temperature: 98
Potassium: 7.2 mmol/L (ref 3.5–5.1)
Sodium: 134 mmol/L — ABNORMAL LOW (ref 135–145)
TCO2: 22 mmol/L (ref 22–32)
pCO2 arterial: 105.4 mmHg (ref 32–48)
pH, Arterial: 6.862 — CL (ref 7.35–7.45)
pO2, Arterial: 116 mmHg — ABNORMAL HIGH (ref 83–108)

## 2022-10-24 LAB — TROPONIN I (HIGH SENSITIVITY)
Troponin I (High Sensitivity): 595 ng/L (ref <18)
Troponin I (High Sensitivity): 2025 ng/L (ref <18)

## 2022-10-24 LAB — MAGNESIUM: Magnesium: 3.2 mg/dL — ABNORMAL HIGH (ref 1.7–2.4)

## 2022-10-24 LAB — ABO/RH: ABO/RH(D): B POS

## 2022-10-24 LAB — TYPE AND SCREEN
ABO/RH(D): B POS
Antibody Screen: NEGATIVE

## 2022-10-24 LAB — HEMOGLOBIN A1C
Hgb A1c MFr Bld: 5.8 % — ABNORMAL HIGH (ref 4.8–5.6)
Mean Plasma Glucose: 119.76 mg/dL

## 2022-10-24 LAB — ECHOCARDIOGRAM LIMITED
Est EF: <20
Height: 69 in
S' Lateral: 3.8 cm

## 2022-10-24 LAB — PROTIME-INR
INR: 2.7 — ABNORMAL HIGH (ref 0.8–1.2)
Prothrombin Time: 28 seconds — ABNORMAL HIGH (ref 11.4–15.2)

## 2022-10-24 LAB — FIBRINOGEN: Fibrinogen: 269 mg/dL (ref 210–475)

## 2022-10-24 MED ORDER — ACETAMINOPHEN 325 MG PO TABS: 650.0000 mg | ORAL_TABLET | ORAL | Status: DC | PRN

## 2022-10-24 MED ORDER — INSULIN REGULAR(HUMAN) IN NACL 100-0.9 UT/100ML-% IV SOLN
INTRAVENOUS | Status: DC
  Administered 2022-10-24: 5 [IU]/h via INTRAVENOUS
  Filled 2022-10-24: qty 100

## 2022-10-24 MED ORDER — ONDANSETRON HCL 4 MG/2ML IJ SOLN: 4.0000 mg | Freq: Four times a day (QID) | INTRAMUSCULAR | Status: DC | PRN

## 2022-10-24 MED ORDER — "THROMBI-PAD 3""X3"" EX PADS"
1.0000 | MEDICATED_PAD | Freq: Once | CUTANEOUS | Status: DC
  Filled 2022-10-24: qty 1

## 2022-10-24 MED ORDER — SODIUM BICARBONATE 8.4 % IV SOLN
100.0000 meq | Freq: Once | INTRAVENOUS | Status: AC
  Administered 2022-10-24: 100 meq via INTRAVENOUS

## 2022-10-24 MED ORDER — ACETAMINOPHEN 325 MG PO TABS: 650.0000 mg | ORAL_TABLET | ORAL | Status: DC

## 2022-10-24 MED ORDER — EPINEPHRINE HCL 5 MG/250ML IV SOLN IN NS
0.5000 ug/min | INTRAVENOUS | Status: DC
  Administered 2022-10-24 – 2022-10-25 (×3): 20 ug/min via INTRAVENOUS
  Filled 2022-10-24 (×2): qty 250

## 2022-10-24 MED ORDER — SODIUM CHLORIDE 0.9 % IV BOLUS
1000.0000 mL | Freq: Once | INTRAVENOUS | Status: AC
  Administered 2022-10-24: 1000 mL via INTRAVENOUS

## 2022-10-24 MED ORDER — DEXTROSE 50 % IV SOLN: 0.0000 mL | INTRAVENOUS | Status: DC | PRN

## 2022-10-24 MED ORDER — GLUCOSE 4 G PO CHEW
CHEWABLE_TABLET | ORAL | Status: AC
  Filled 2022-10-24: qty 1

## 2022-10-24 MED ORDER — ACETAMINOPHEN 160 MG/5ML PO SOLN
650.0000 mg | ORAL | Status: DC
  Administered 2022-10-24 (×2): 650 mg
  Filled 2022-10-24 (×2): qty 20.3

## 2022-10-24 MED ORDER — BUSPIRONE HCL 15 MG PO TABS: 30.0000 mg | ORAL_TABLET | Freq: Three times a day (TID) | ORAL | Status: DC | PRN

## 2022-10-24 MED ORDER — LACTATED RINGERS IV SOLN: INTRAVENOUS | Status: DC

## 2022-10-24 MED ORDER — DEXTROSE 50 % IV SOLN
INTRAVENOUS | Status: AC | PRN
  Administered 2022-10-24 (×3): 1 via INTRAVENOUS

## 2022-10-24 MED ORDER — SODIUM BICARBONATE 8.4 % IV SOLN: 100.0000 meq | Freq: Once | INTRAVENOUS | Status: AC

## 2022-10-24 MED ORDER — DEXTROSE 50 % IV SOLN
1.0000 | Freq: Once | INTRAVENOUS | Status: AC
  Administered 2022-10-24: 50 mL via INTRAVENOUS

## 2022-10-24 MED ORDER — SODIUM CHLORIDE 0.9 % IV SOLN
250.0000 mL | INTRAVENOUS | Status: DC
  Administered 2022-10-24: 250 mL via INTRAVENOUS

## 2022-10-24 MED ORDER — VASOPRESSIN 20 UNITS/100 ML INFUSION FOR SHOCK
0.0000 [IU]/min | INTRAVENOUS | Status: DC
  Administered 2022-10-24 (×2): 0.03 [IU]/min via INTRAVENOUS
  Filled 2022-10-24: qty 100

## 2022-10-24 MED ORDER — EPINEPHRINE 1 MG/10ML IJ SOSY
PREFILLED_SYRINGE | INTRAMUSCULAR | Status: AC | PRN
  Administered 2022-10-24: 1 mg via INTRAVENOUS

## 2022-10-24 MED ORDER — DEXTROSE 50 % IV SOLN
INTRAVENOUS | Status: AC
  Filled 2022-10-24: qty 50

## 2022-10-24 MED ORDER — DEXTROSE 50 % IV SOLN
1.0000 | Freq: Once | INTRAVENOUS | Status: AC
  Administered 2022-10-24: 50 mL via INTRAVENOUS
  Filled 2022-10-24: qty 50

## 2022-10-24 MED ORDER — MIDAZOLAM HCL 2 MG/2ML IJ SOLN
1.0000 mg | INTRAMUSCULAR | Status: DC | PRN
  Administered 2022-10-24: 2 mg via INTRAVENOUS
  Filled 2022-10-24: qty 2

## 2022-10-24 MED ORDER — ACETAMINOPHEN 160 MG/5ML PO SOLN
650.0000 mg | ORAL | Status: DC | PRN
  Administered 2022-10-24: 650 mg

## 2022-10-24 MED ORDER — CHLORHEXIDINE GLUCONATE CLOTH 2 % EX PADS: 6.0000 | MEDICATED_PAD | Freq: Every day | CUTANEOUS | Status: DC

## 2022-10-24 MED ORDER — SODIUM CHLORIDE 0.9 % IV SOLN
3.0000 g | INTRAVENOUS | Status: DC
  Administered 2022-10-24: 3 g via INTRAVENOUS
  Filled 2022-10-24: qty 8

## 2022-10-24 MED ORDER — SODIUM BICARBONATE 8.4 % IV SOLN
200.0000 meq | Freq: Once | INTRAVENOUS | Status: AC
  Administered 2022-10-24: 200 meq via INTRAVENOUS

## 2022-10-24 MED ORDER — PERFLUTREN LIPID MICROSPHERE
1.0000 mL | INTRAVENOUS | Status: AC | PRN
  Administered 2022-10-24: 2 mL via INTRAVENOUS

## 2022-10-24 MED ORDER — NOREPINEPHRINE 4 MG/250ML-% IV SOLN
0.0000 ug/min | INTRAVENOUS | Status: DC
  Administered 2022-10-24: 60 ug/min via INTRAVENOUS
  Administered 2022-10-24: 80 ug/min via INTRAVENOUS
  Administered 2022-10-24: 60 ug/min via INTRAVENOUS
  Administered 2022-10-24: 75 ug/min via INTRAVENOUS
  Filled 2022-10-24: qty 500
  Filled 2022-10-24: qty 250
  Filled 2022-10-24: qty 500

## 2022-10-24 MED ORDER — NOREPINEPHRINE 16 MG/250ML-% IV SOLN
0.0000 ug/min | INTRAVENOUS | Status: DC
  Administered 2022-10-24: 66 ug/min via INTRAVENOUS
  Administered 2022-10-24: 75 ug/min via INTRAVENOUS
  Administered 2022-10-25: 80 ug/min via INTRAVENOUS
  Filled 2022-10-24 (×3): qty 250

## 2022-10-24 MED ORDER — ORAL CARE MOUTH RINSE
15.0000 mL | OROMUCOSAL | Status: DC
  Administered 2022-10-24 – 2022-10-25 (×3): 15 mL via OROMUCOSAL

## 2022-10-24 MED ORDER — INSULIN ASPART 100 UNIT/ML IJ SOLN
0.0000 [IU] | INTRAMUSCULAR | Status: DC
  Administered 2022-10-24 (×2): 9 [IU] via SUBCUTANEOUS

## 2022-10-24 MED ORDER — DOCUSATE SODIUM 50 MG/5ML PO LIQD: 100.0000 mg | Freq: Two times a day (BID) | ORAL | Status: DC

## 2022-10-24 MED ORDER — SODIUM BICARBONATE 8.4 % IV SOLN
INTRAVENOUS | Status: AC
  Filled 2022-10-24: qty 50

## 2022-10-24 MED ORDER — SODIUM ZIRCONIUM CYCLOSILICATE 10 G PO PACK
10.0000 g | PACK | Freq: Once | ORAL | Status: AC
  Administered 2022-10-24: 10 g
  Filled 2022-10-24: qty 1

## 2022-10-24 MED ORDER — PANTOPRAZOLE SODIUM 40 MG IV SOLR
40.0000 mg | Freq: Every day | INTRAVENOUS | Status: DC
  Administered 2022-10-24: 40 mg via INTRAVENOUS
  Filled 2022-10-24: qty 10

## 2022-10-24 MED ORDER — CALCIUM GLUCONATE-NACL 1-0.675 GM/50ML-% IV SOLN
1.0000 g | Freq: Once | INTRAVENOUS | Status: AC
  Administered 2022-10-24: 1000 mg via INTRAVENOUS
  Filled 2022-10-24: qty 50

## 2022-10-24 MED ORDER — EPINEPHRINE 1 MG/10ML IJ SOSY
PREFILLED_SYRINGE | INTRAMUSCULAR | Status: AC | PRN
  Administered 2022-10-24 (×2): 1 mg via INTRAVENOUS

## 2022-10-24 MED ORDER — SODIUM CHLORIDE 0.9% FLUSH
10.0000 mL | Freq: Two times a day (BID) | INTRAVENOUS | Status: DC
  Administered 2022-10-24: 10 mL

## 2022-10-24 MED ORDER — SODIUM CHLORIDE 0.9% FLUSH: 10.0000 mL | INTRAVENOUS | Status: DC | PRN

## 2022-10-24 MED ORDER — MAGNESIUM SULFATE 2 GM/50ML IV SOLN: 2.0000 g | Freq: Once | INTRAVENOUS | Status: DC | PRN

## 2022-10-24 MED ORDER — DOCUSATE SODIUM 100 MG PO CAPS: 100.0000 mg | ORAL_CAPSULE | Freq: Two times a day (BID) | ORAL | Status: DC | PRN

## 2022-10-24 MED ORDER — POLYETHYLENE GLYCOL 3350 17 G PO PACK: 17.0000 g | PACK | Freq: Every day | ORAL | Status: DC

## 2022-10-24 MED ORDER — SODIUM BICARBONATE 8.4 % IV SOLN
INTRAVENOUS | Status: AC
  Filled 2022-10-24: qty 200

## 2022-10-24 MED ORDER — ACETAMINOPHEN 650 MG RE SUPP: 650.0000 mg | RECTAL | Status: DC

## 2022-10-24 MED ORDER — ALBUTEROL SULFATE (2.5 MG/3ML) 0.083% IN NEBU
2.5000 mg | INHALATION_SOLUTION | RESPIRATORY_TRACT | Status: DC | PRN
  Administered 2022-10-24: 2.5 mg via RESPIRATORY_TRACT
  Filled 2022-10-24: qty 3

## 2022-10-24 MED ORDER — CALCIUM GLUCONATE 10 % IV SOLN
1.0000 g | Freq: Once | INTRAVENOUS | Status: AC
  Administered 2022-10-24: 1 g via INTRAVENOUS
  Filled 2022-10-24: qty 10

## 2022-10-24 MED ORDER — ORAL CARE MOUTH RINSE: 15.0000 mL | OROMUCOSAL | Status: DC | PRN

## 2022-10-24 MED ORDER — SODIUM BICARBONATE 8.4 % IV SOLN
100.0000 meq | Freq: Once | INTRAVENOUS | Status: AC
  Administered 2022-10-24: 100 meq via INTRAVENOUS
  Filled 2022-10-24: qty 50

## 2022-10-24 MED ORDER — POLYETHYLENE GLYCOL 3350 17 G PO PACK: 17.0000 g | PACK | Freq: Every day | ORAL | Status: DC | PRN

## 2022-10-24 MED ORDER — ALBUTEROL SULFATE (2.5 MG/3ML) 0.083% IN NEBU
10.0000 mg | INHALATION_SOLUTION | Freq: Once | RESPIRATORY_TRACT | Status: AC
  Administered 2022-10-24: 10 mg via RESPIRATORY_TRACT
  Filled 2022-10-24: qty 12

## 2022-10-24 MED ORDER — INSULIN ASPART 100 UNIT/ML IV SOLN
5.0000 [IU] | Freq: Once | INTRAVENOUS | Status: AC
  Administered 2022-10-24: 5 [IU] via INTRAVENOUS

## 2022-10-24 MED ORDER — ACETAMINOPHEN 650 MG RE SUPP: 650.0000 mg | RECTAL | Status: DC | PRN

## 2022-10-24 MED ORDER — SODIUM BICARBONATE 8.4 % IV SOLN
INTRAVENOUS | Status: AC | PRN
  Administered 2022-10-24 (×4): 50 meq via INTRAVENOUS

## 2022-10-24 MED ORDER — SODIUM CHLORIDE 0.9 % IV SOLN: INTRAVENOUS | Status: DC | PRN

## 2022-10-24 MED ORDER — SODIUM BICARBONATE 8.4 % IV SOLN
INTRAVENOUS | Status: DC
  Filled 2022-10-24 (×4): qty 1000

## 2022-10-24 MED ORDER — INSULIN ASPART 100 UNIT/ML IV SOLN
10.0000 [IU] | Freq: Once | INTRAVENOUS | Status: AC
  Administered 2022-10-24: 10 [IU] via INTRAVENOUS

## 2022-10-24 MED ORDER — SODIUM BICARBONATE 8.4 % IV SOLN
INTRAVENOUS | Status: AC
  Administered 2022-10-24: 100 meq via INTRAVENOUS
  Filled 2022-10-24: qty 100

## 2022-10-24 MED ORDER — CALCIUM CHLORIDE 10 % IV SOLN
INTRAVENOUS | Status: AC | PRN
  Administered 2022-10-24 (×2): 1 g via INTRAVENOUS

## 2022-10-24 NOTE — Progress Notes (Signed)
[facility] Progress Note Patient Name: Louis Sullivan DOB: [birth year] MRN: [id number]   Date of Service  10/21/2022  HPI/Events of Note  Multiple issues: 1. ABG on 80%/PRVC 34/TV 560/P 5 = 7.177/50.9/68/19.0 2. K+ from iSTAT = 8.1. Last Lactic Acid > 9.0. Repeat Lactic Acid in process.   eICU Interventions  Plan: NaHCO3 100 meq IV now. Calcium gluconate 1 gm IV now.  Will speak with family about GOC.     Intervention Category Major Interventions: Acid-Base disturbance - evaluation and management;Respiratory failure - evaluation and management;Electrolyte abnormality - evaluation and management  Louis Sullivan 10/27/2022, 11:09 PM

## 2022-10-24 NOTE — ED Notes (Signed)
Louis Sullivan (Brother) called asking for an update, when there is info to be given. His is [phone].

## 2022-10-24 NOTE — Progress Notes (Signed)
  Echocardiogram 2D Echocardiogram has been performed.  Louis Sullivan 11/08/2022, 5:55 PM

## 2022-10-24 NOTE — Progress Notes (Addendum)
LTM EEG hooked up and running - no initial skin breakdown - push button tested - [facility] monitoring.  

## 2022-10-24 NOTE — Progress Notes (Signed)
[facility] Progress Note Patient Name: Louis Sullivan DOB: 1950/12/02 MRN: [id number]   Date of Service  10/17/2022  HPI/Events of Note  Multiple issues: 1. Agitation - Patient now biting on ETT and having ventilator asynchrony. 2. Hyperkalemia - K+ > 7.5. 3. Hyperglycemia - Blood glucose = 403 --> 373 on Q 4 sensitive Novolog SSI.  eICU Interventions  Plan: Versed 1-2 mg IV Q 2 hours PRN agitation or sedation.  Lokelma 10 gm per tube now. NaHCO3 100 meq IV now. Calcium gluconate 1 gm IV now. D50 1 amp IV now.  Novolog insulin 10 units IV now. Repeat BMP at 1 AM. Insulin IV infusion per [employer] protocol D/C Q 4 hour sensitive Novolog SSI.     Intervention Category Major Interventions: Other:;Electrolyte abnormality - evaluation and management;Hyperglycemia - active titration of insulin therapy  Lysle Dingwall 10/17/2022, 9:13 PM

## 2022-10-24 NOTE — Procedures (Signed)
Intraosseous Needle Insertion Procedure Note    Date:11/08/2022  Time:4:56 PM   Provider Performing:Tarrence Enck   Procedure: Insertion Intraosseous (44695)  Indication(s) Medication administration  Consent Unable to obtain consent due to emergent nature of procedure.  Anesthesia Topical only with 1% lidocaine   Timeout Verified patient identification, verified procedure, site/side was marked, verified correct patient position, special equipment/implants available, medications/allergies/relevant history reviewed, required imaging and test results available.  Procedure Description Area of needle insertion was cleaned with chlorhexidine. Intraosseous needle was placed into the left tibia. Bone marrow was aspirated and site easily flushed. The needle was secured in place and dressing applied.  Complications/Tolerance None; patient tolerated the procedure well.  EBL Minimal

## 2022-10-24 NOTE — Progress Notes (Signed)
RT transported via ventilator to CT and 87M with no apparent complications.

## 2022-10-24 NOTE — Progress Notes (Signed)
Pt had duplicate ABG order, MD and RT.  ABG obtained at 1642 and results are in chart.  No changes made at this time.

## 2022-10-24 NOTE — H&P (Addendum)
NAME:  Louis Sullivan, MRN:  [id number], DOB:  [birth year], LOS: 0 ADMISSION DATE:  11/09/2022, CONSULTATION DATE:  11/11/2022 REFERRING MD:  Francia Greaves - EM, CHIEF COMPLAINT:  Cardiac arrest    History of Present Illness:   72 yo M PMH Stg 3b lung cancer (s/p carboplatin/paclitaxel + XRT 02/19/22-04/09/22, now on durvalumab q2wk started 06/26/22), multivessel CAD, COPD, prior smoking who presented to ED 2/8 following an out of hospital cardiac arrest. Arrest was witnessed at home. On EMS arrival, was PEA. Initially ROSC after about 15 minutes, but became pulseless multiple times in transport and required CPR on and off for about 60 minutes -- non-shockable throughout.   In the ED an ETT was placed. There was no sedation or NMB given for this.  Shortly after arrival in ED, pt lost pulses again, asystole. ROSC after about 10-15 minutes in ED.   After ROSC pt in shock  ECG with abnormal T waves concerning for inferior ischemia as well as a possible acute anteroseptal infarct. EMP discussed with cardiology who determined patient was not a candidate for acute intervention.  Hypoglycemic in ED with CBG 32 -- received mulitple amps of D50 for this with follow up CBG approx 130   PCCM called for admission in this setting   Pertinent  Medical History   Stg IIIb lung cancer Multivessel CAD  COPD Tobacco use disorder  DM HLD HTN  Significant Hospital Events: Including procedures, antibiotic start and stop dates in addition to other pertinent events   2/8 OOH cardiac arrest, on and off x 60 min, non-shockable. ETT in ED. Soon after arrival to ED, Asystole with 15 min to ROSC. EKG concerning for possible acute MI. ED d/w cards, no intervention. Admit to ICU in shock   Interim History / Subjective:  Rapidly increasing NE doses  Labs have started to come back, marked abnormalities compared to CMP CBC 10/15/22   Objective   Blood pressure 117/86, pulse 88, temperature 99.8 F (37.7 C), resp. rate (!)  34, SpO2 100 %.    Vent Mode: PRVC FiO2 (%):  [100 %] 100 % Set Rate:  [20 bmp-32 bmp] 32 bmp Vt Set:  [560 mL] 560 mL PEEP:  [5 cmH20] 5 cmH20  No intake or output data in the 24 hours ending 11/13/2022 1555 There were no vitals filed for this visit.  Examination: General: critically ill elderly M intubated  HENT: ETT secure anicteric sclera  Lungs: R>L breath sounds, some coarseness. Mechanically ventilated  Cardiovascular: tachycardic. Sluggish cap refill  Abdomen: soft nd Extremities: no acute joint deformity. BLE IOs  Neuro: comatose. Anisocoria, disconjugate gaze. R pupil is 48mm  GU: foley   Resolved Hospital Problem list     Assessment & Plan:   Acute encephalopathy -concern for anoxic encephalopathy, possible intracranial process like ICH given pupils. Metabolic also possible given acidosis and acute metabolic abnormalities -has had 0 sedation (none with EMS, none for intubation in ED, none post intubation)  P -continues off sedation -STAT CT H  -possible cEEG -close neuro monitoring   Cardiac arrest Shock  MI  -prolonged OOH arrest downtime req CPR on and off x 60 min, non-shockable -subsequent asystolic arrest in ED ROSC after 10-15 min  -ECG concerning for acute anteroseptal infarct and inferior ischemia, not a candidate for intervention when ED d/w cards P -adding vaso and epi. MAP goal > 65  -hep gtt pending CT H & awaiting CBC  -ECHO -trops BNP pending -will check a coox  off of port  -repeat ECG PRN  -avoid fever  Acute respiratory failure with hypercarbia Stg IIIb lung cancer (currently undergoing chemo) COPD Prior tobacco use  -covid, flu, RSV neg  P -incr RR to 34 based on ABG 6.86/105/116/22/15/19/93 -repeat gas in an hour -CXR -lung protective ventilation, adjust as needed -VAP, pulm hygiene  -will do a 3d course of unasyn given aspiration risk with prolonged arrest   AKI Hyperkalemia  P -foley -strict I/O -lokelma, insulin/d50,  albuterol  -LR -bicarb  Shock liver  Coagulopathy  -trend   Severe AGMA, mixed with resp acidosis  Lactic acidosis  -vent changes as above -trend -bicarb gtt   Acute thrombocytopenia  Acute anemia  -trend   Hx HTN Hx HLD -holding home meds with current shock and hepatorenal dysfunction   DM with labile glucose -hypoglycemic in ED now hyper P -SSI   Iatrogenic thyroid dysfunction -TSH noted <0.01 with T4 10.8 on 10/15/22. Felt iatrogenic at onc visit that day  P -will recheck TSH   Best Practice (right click and "Reselect all SmartList Selections" daily)   Diet/type: NPO DVT prophylaxis: not indicated GI prophylaxis: PPI Lines: Central line and yes and it is still needed (port in place. 2 Ios in place)  Foley:  Yes, and it is still needed Code Status:  full code Last date of multidisciplinary goals of care discussion [2/8 -- talked with daughter on phone, and SIL, niece in person w chaplain support]  Labs   CBC: Recent Labs  Lab 11/03/2022 1348 10/20/2022 1407 11/03/2022 1410  WBC  --   --  8.5  NEUTROABS  --   --  5.3  HGB 9.5* 9.9* 9.6*  HCT 28.0* 29.0* 33.5*  MCV  --   --  102.8*  PLT  --   --  45*    Basic Metabolic Panel: Recent Labs  Lab 11/04/2022 1348 11/03/2022 1407 10/27/2022 1410  NA 139 134* 141  K 5.6* 7.2* 6.7*  CL 108  --  99  CO2  --   --  14*  GLUCOSE 32*  --  396*  BUN 51*  --  54*  CREATININE 6.10*  --  6.32*  CALCIUM  --   --  9.4  MG  --   --  3.2*   GFR: Estimated Creatinine Clearance: 12.3 mL/min (A) (by C-G formula based on SCr of 6.32 mg/dL (H)). Recent Labs  Lab 10/20/2022 1410 10/29/2022 1411  WBC 8.5  --   LATICACIDVEN  --  >9.0*    Liver Function Tests: Recent Labs  Lab 11/03/2022 1410  AST 1,107*  ALT 704*  ALKPHOS 244*  BILITOT 1.0  PROT 3.7*  ALBUMIN 1.6*   No results for input(s): "LIPASE", "AMYLASE" in the last 168 hours. No results for input(s): "AMMONIA" in the last 168 hours.  ABG    Component Value  Date/Time   PHART 6.862 (LL) 11/13/2022 1407   PCO2ART 105.4 (HH) 10/31/2022 1407   PO2ART 116 (H) 11/06/2022 1407   HCO3 19.0 (L) 10/18/2022 1407   TCO2 22 11/07/2022 1407   ACIDBASEDEF 15.0 (H) 11/07/2022 1407   O2SAT 93 10/29/2022 1407     Coagulation Profile: Recent Labs  Lab 10/20/2022 1410  INR 2.7*    Cardiac Enzymes: No results for input(s): "CKTOTAL", "CKMB", "CKMBINDEX", "TROPONINI" in the last 168 hours.  HbA1C: Hgb A1c MFr Bld  Date/Time Value Ref Range Status  11/22/2021 05:02 PM 5.7 (H) 4.8 - 5.6 % Final  Comment:    (NOTE)         Prediabetes: 5.7 - 6.4         Diabetes: >6.4         Glycemic control for adults with diabetes: <7.0     CBG: Recent Labs  Lab 11/09/2022 1353 10/19/2022 1407  GLUCAP <10* 383*    Review of Systems:   Unable to obtain   Past Medical History:  He,  has a past medical history of COPD (chronic obstructive pulmonary disease) ([place]), Coronary artery disease, Diabetes mellitus without complication ([place]), Dyspnea, Headache, Hyperlipidemia, Hypertension, and Pneumonia (12/2021).   Surgical History:   Past Surgical History:  Procedure Laterality Date   CARDIAC CATHETERIZATION     COLONOSCOPY     IR IMAGING GUIDED PORT INSERTION  02/12/2022   VIDEO BRONCHOSCOPY WITH ENDOBRONCHIAL NAVIGATION N/A 01/23/2022   Procedure: VIDEO BRONCHOSCOPY WITH ENDOBRONCHIAL NAVIGATION;  Surgeon: Ottie Glazier, MD;  Location: [facility] ORS;  Service: Thoracic;  Laterality: N/A;   VIDEO BRONCHOSCOPY WITH ENDOBRONCHIAL ULTRASOUND N/A 01/23/2022   Procedure: VIDEO BRONCHOSCOPY WITH ENDOBRONCHIAL ULTRASOUND;  Surgeon: Ottie Glazier, MD;  Location: [facility] ORS;  Service: Thoracic;  Laterality: N/A;     Social History:   reports that he quit smoking about 8 months ago. His smoking use included cigarettes. He has a 20.00 pack-year smoking history. He has never used smokeless tobacco. He reports that he does not currently use alcohol. He reports that he does not  use drugs.   Family History:  His family history includes Throat cancer in his brother.   Allergies Allergies  Allergen Reactions   Paclitaxel Shortness Of Breath, Swelling, Other (See Comments), Cough and Hypertension    Chest pain and tightness     Home Medications  Prior to Admission medications   Medication Sig Start Date End Date Taking? Authorizing Provider  albuterol (PROVENTIL) (5 MG/ML) 0.5% nebulizer solution Take 2.5 mg by nebulization every morning.    [provider]  albuterol (VENTOLIN HFA) 108 (90 Base) MCG/ACT inhaler Inhale 2 puffs into the lungs every 4 (four) hours as needed for wheezing or shortness of breath.    [provider]  aspirin EC 81 MG tablet Take 81 mg by mouth daily.    [provider]  atorvastatin (LIPITOR) 80 MG tablet Take 1 tablet (80 mg total) by mouth every morning. 06/14/22   Kate Sable, MD  benzonatate (TESSALON) 100 MG capsule Take 1 capsule (100 mg total) by mouth 3 (three) times daily as needed for cough. 09/17/22   Cammie Sickle, MD  blood glucose meter kit and supplies KIT Dispense based on patient and insurance preference. Use up to four times daily as directed. 02/19/22   Cammie Sickle, MD  buPROPion (WELLBUTRIN XL) 150 MG 24 hr tablet Take 150 mg by mouth daily. 08/13/22   [provider]  Cholecalciferol (VITAMIN D3 PO) Take 1 tablet by mouth daily at 6 (six) AM.    [provider]  hydrocortisone 2.5 % ointment Apply topically 2 (two) times daily. Apply to skin rash twice a day as needed for itch. 03/25/22   Verlon Au, NP  lidocaine-prilocaine (EMLA) cream Apply on the port. 30 -45 min  prior to port access. 02/08/22   Cammie Sickle, MD  losartan (COZAAR) 100 MG tablet Take 100 mg by mouth every evening.    [provider]  metFORMIN (GLUCOPHAGE) 1000 MG tablet Take 1,000 mg by mouth daily with breakfast.  [provider]  metoprolol succinate  (TOPROL XL) 25 MG 24 hr tablet Take 1 tablet (25 mg total) by mouth daily. 05/31/22   Kate Sable, MD  Nicotine 21-14-7 MG/24HR KIT Place 1 patch onto the skin daily. Patient not taking: Reported on 10/01/2022 02/19/22   Cammie Sickle, MD  ondansetron [facility]) 8 MG tablet One pill every 8 hours as needed for nausea/vomitting. 02/08/22   Cammie Sickle, MD  OXYGEN Inhale 2 L into the lungs at bedtime.    [provider]  POLYETHYLENE GLYCOL 3350 PO Take 1 Container by mouth daily as needed.    [provider]  prochlorperazine (COMPAZINE) 10 MG tablet TAKE 1 TABLET(10 MG) BY MOUTH EVERY 6 HOURS AS NEEDED FOR NAUSEA OR VOMITING Patient not taking: Reported on 09/25/2022 03/13/22   Cammie Sickle, MD  traZODone (DESYREL) 50 MG tablet Take 50 mg by mouth at bedtime. Patient not taking: Reported on 06/26/2022    [provider]     Critical care time: 47 min     CRITICAL CARE Performed by: Cristal Generous  Total critical care time: 47 minutes  Critical care time was exclusive of separately billable procedures and treating other patients. Critical care was necessary to treat or prevent imminent or life-threatening deterioration.  Critical care was time spent personally by me on the following activities: development of treatment plan with patient and/or surrogate as well as nursing, discussions with consultants, evaluation of patient's response to treatment, examination of patient, obtaining history from patient or surrogate, ordering and performing treatments and interventions, ordering and review of laboratory studies, ordering and review of radiographic studies, pulse oximetry and re-evaluation of patient's condition.  Eliseo Gum MSN, AGACNP-BC [place] for pager  10/21/2022, 3:55 PM

## 2022-10-24 NOTE — Procedures (Signed)
Cardiopulmonary Resuscitation Note  Louis Sullivan  [id number]  02/21/1951  Date:11/06/2022  Time:4:55 PM   Provider Performing:Erynn Vaca   Procedure: Cardiopulmonary Resuscitation (99833)  Indication(s) Loss of Pulse  Consent N/A  Anesthesia N/A   Time Out N/A   Sterile Technique Hand hygiene, gloves   Procedure Description Called to patient's room for CODE BLUE. Initial rhythm was PEA/Asystole. Patient received high quality chest compressions for 9 minutes with defibrillation or cardioversion when appropriate. Epinephrine was administered every 3 minutes as directed by time Therapist, nutritional. Additional pharmacologic interventions included calcium chloride, sodium bicarbonate, and vasopressin. Additional procedural interventions include intra-osseus line and bedside echo.  Return of spontaneous circulation was achieved.  Family called and notified.   Complications/Tolerance N/A   EBL N/A   Specimen(s) N/A  Estimated time to ROSC: 66minutes

## 2022-10-24 NOTE — Progress Notes (Signed)
[facility] Progress Note Patient Name: Louis Sullivan DOB: 09/16/1951 MRN: [id number]   Date of Service  10/22/2022  HPI/Events of Note  Back in wide complex rhythm. Suspect d/t hyperkalemia and severe metabolic acidosis.   eICU Interventions  Plan: NaHCO3 100 meq IV now.  Will alert PCCM ground team that patient may need CRRT if K+ remains high.      Intervention Category Major Interventions: Arrhythmia - evaluation and management  Sherise Geerdes Cornelia Copa 10/28/2022, 10:39 PM

## 2022-10-24 NOTE — Progress Notes (Signed)
   11/10/2022 1400  Spiritual Encounters  Type of Visit Initial  Care provided to: [facility] [place] partners present during encounter Nurse;Physician  Referral source Code page  Reason for visit Trauma  OnCall Visit No  Interventions  Spiritual Care Interventions Made Established relationship of care and support;Compassionate presence;Decision-making support/facilitation;Prayer   Chaplain responded to STEMI code and met family in the consultation room.  Was with family while Physician described the very serious condition of patient. Was with family to help begin to process the news and provided water and tissues upon request.  Chaplain was able to find out what room patient would be moved to and escorted family to the appropriate waiting area and informed the nursing staff who and where they were.  Chaplain remains available if needed.
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
## 2022-10-24 NOTE — Progress Notes (Addendum)
[facility] Progress Note Patient Name: Louis Sullivan DOB: 1951/02/24 MRN: [id number]   Date of Service  11/01/2022  HPI/Events of Note  ABG on 80%/PRVC 34/TV 560/P 5 = 9.955/48.7/102/10.6.  eICU Interventions  Plan: NaHCO3 200 meq IV now. Increase NaHCO3 IV infusion rate to 150 mL/hour. Repeat ABG at 11 PM.        Lysle Dingwall 11/06/2022, 9:59 PM

## 2022-10-24 NOTE — IPAL (Signed)
  Interdisciplinary Goals of Care Family Meeting   Date carried out: 10/27/2022  Location of the meeting: Conference room  Member's involved: Nurse Practitioner, Chaplain, and Family Member or next of kin  Durable Power of Attorney or acting medical decision maker: daughter Louis Sullivan.      Discussion: We discussed goals of care for Louis Sullivan .   First discussed with NOK, daughter Louis Sullivan, via phone as Louis Sullivan prepares to come to hospital from [place]. Updated on pre hospital and ED course, discussed current critical status and risk for subsequent arrest.  They have not ever talked about his wishes in such an instance and he remains full code for now. She asked that I update the family currently at the hospital  SIL, niece, pt church leaders and hospital chaplain in conference room. Family understands critical status and possibility of subsequent arrest. Discussed the same. Recommended that family reflect with each other about what QOL the pt would find acceptable as well as if there are any medical "lines in the sand" he would not want crossed. Church leader shares that the patient would say that this is God taking him and would not want these things, but ultimately this is Cynthias decision. I echoed his understanding of NOK and decision making, and support family discussing these things together as a family with [place].   All questions answered   Code status:   Code Status: Full Code   Disposition: Continue current acute care  Time spent for the meeting: 15 min    Cristal Generous, NP  11/02/2022, 3:55 PM

## 2022-10-24 NOTE — Progress Notes (Signed)
[facility] Progress Note Patient Name: Louis Sullivan DOB: Jan 28, 1951 MRN: [id number]   Date of Service  11/02/2022  HPI/Events of Note  Oozing from L subclavian central venous line site. Thrombi-pads on backorder nationally.   eICU Interventions  Plan: Surgicel pad to oozing L subclavian line site. DIC panel STAT.     Intervention Category Major Interventions: Other:  Lysle Dingwall 10/27/2022, 11:21 PM
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
## 2022-10-24 NOTE — ED Notes (Signed)
Pt's potassium is 6.7. lactic greater than 9. MD Chand made aware via secure chat.

## 2022-10-24 NOTE — Procedures (Signed)
Central Venous Catheter Insertion Procedure Note  GARELD OBRECHT  [id number]  [birth year]  Date:10/27/2022  Time:5:05 PM   Provider Performing:Vanya Carberry   Procedure: Insertion of Non-tunneled Central Venous Catheter(36556) with US guidance (56389)   Indication(s) Medication administration  Consent Unable to obtain consent due to emergent nature of procedure.  Anesthesia Topical only with 1% lidocaine   Timeout Verified patient identification, verified procedure, site/side was marked, verified correct patient position, special equipment/implants available, medications/allergies/relevant history reviewed, required imaging and test results available.  Sterile Technique Maximal sterile technique including full sterile barrier drape, hand hygiene, sterile gown, sterile gloves, mask, hair covering, sterile ultrasound probe cover (if used).  Procedure Description Area of catheter insertion was cleaned with chlorhexidine and draped in sterile fashion.  With real-time ultrasound guidance a central venous catheter was placed into the left subclavian vein. Nonpulsatile blood flow and easy flushing noted in all ports.  The catheter was sutured in place and sterile dressing applied.  Complications/Tolerance None; patient tolerated the procedure well. Chest X-ray is ordered to verify placement for internal jugular or subclavian cannulation.   Chest x-ray is not ordered for femoral cannulation.  EBL Minimal  Specimen(s) None

## 2022-10-24 NOTE — Procedures (Signed)
Arterial Catheter Insertion Procedure Note  MERCED HANNERS  [id number]  December 02, 1950  Date:10/23/2022  Time:5:04 PM    Provider Performing: Jacky Kindle    Procedure: Insertion of Arterial Line [phone]) with US guidance (31517)   Indication(s) Blood pressure monitoring and/or need for frequent ABGs  Consent Risks of the procedure as well as the alternatives and risks of each were explained to the patient and/or caregiver.  Consent for the procedure was obtained and is signed in the bedside chart  Anesthesia None   Time Out Verified patient identification, verified procedure, site/side was marked, verified correct patient position, special equipment/implants available, medications/allergies/relevant history reviewed, required imaging and test results available.   Sterile Technique Maximal sterile technique including full sterile barrier drape, hand hygiene, sterile gown, sterile gloves, mask, hair covering, sterile ultrasound probe cover (if used).   Procedure Description Area of catheter insertion was cleaned with chlorhexidine and draped in sterile fashion. With real-time ultrasound guidance an arterial catheter was placed into the right  Axillary  artery.  Appropriate arterial tracings confirmed on monitor.     Complications/Tolerance None; patient tolerated the procedure well.   EBL Minimal   Specimen(s) None
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
## 2022-10-24 NOTE — Progress Notes (Signed)
[facility] Progress Note Patient Name: Louis Sullivan DOB: [birth year] MRN: [id number]   Date of Service  10/30/2022  HPI/Events of Note  Request to review CXR for L IJ central venous line placement. L IJ central venous line tip at junction  of innominate vein and SVC. No pneumothorax.  eICU Interventions  OK to use L IJ central venous line.      Intervention Category Major Interventions: Other:  Lysle Dingwall 10/26/2022, 7:38 PM

## 2022-10-25 DIAGNOSIS — R4182 Altered mental status, unspecified: Secondary | ICD-10-CM

## 2022-10-25 LAB — BLOOD CULTURE ID PANEL (REFLEXED) - BCID2

## 2022-10-25 LAB — POCT I-STAT 7, (LYTES, BLD GAS, ICA,H+H)
Acid-Base Excess: 4 mmol/L — ABNORMAL HIGH (ref 0.0–2.0)
Bicarbonate: 31.1 mmol/L — ABNORMAL HIGH (ref 20.0–28.0)
Calcium, Ion: 0.64 mmol/L — CL (ref 1.15–1.40)
HCT: 19 % — ABNORMAL LOW (ref 39.0–52.0)
Hemoglobin: 6.5 g/dL — CL (ref 13.0–17.0)
O2 Saturation: 94 %
Patient temperature: 35.7
Potassium: >8.5 mmol/L (ref 3.5–5.1)
Sodium: 150 mmol/L — ABNORMAL HIGH (ref 135–145)
TCO2: 33 mmol/L — ABNORMAL HIGH (ref 22–32)
pCO2 arterial: 59.2 mmHg — ABNORMAL HIGH (ref 32–48)
pH, Arterial: 7.321 — ABNORMAL LOW (ref 7.35–7.45)
pO2, Arterial: 73 mmHg — ABNORMAL LOW (ref 83–108)

## 2022-10-25 LAB — BASIC METABOLIC PANEL
Anion gap: 53 — ABNORMAL HIGH (ref 5–15)
BUN: 52 mg/dL — ABNORMAL HIGH (ref 8–23)
CO2: 25 mmol/L (ref 22–32)
Calcium: 6.4 mg/dL — CL (ref 8.9–10.3)
Chloride: 84 mmol/L — ABNORMAL LOW (ref 98–111)
Creatinine, Ser: 6.57 mg/dL — ABNORMAL HIGH (ref 0.61–1.24)
GFR, Estimated: 8 mL/min — ABNORMAL LOW (ref >60)
Glucose, Bld: 201 mg/dL — ABNORMAL HIGH (ref 70–99)
Potassium: >7.5 mmol/L (ref 3.5–5.1)
Sodium: 162 mmol/L (ref 135–145)

## 2022-10-25 LAB — DIC (DISSEMINATED INTRAVASCULAR COAGULATION)PANEL
D-Dimer, Quant: >20 ug/mL-FEU — ABNORMAL HIGH (ref 0.00–0.50)
Fibrinogen: <60 mg/dL — CL (ref 210–475)
INR: 7.7 (ref 0.8–1.2)
Platelets: 24 10*3/uL — CL (ref 150–400)
Prothrombin Time: 64.8 seconds — ABNORMAL HIGH (ref 11.4–15.2)
Smear Review: NONE SEEN
aPTT: 85 seconds — ABNORMAL HIGH (ref 24–36)

## 2022-10-25 LAB — GLUCOSE, CAPILLARY
Glucose-Capillary: 171 mg/dL — ABNORMAL HIGH (ref 70–99)
Glucose-Capillary: 151 mg/dL — ABNORMAL HIGH (ref 70–99)

## 2022-10-25 LAB — LACTIC ACID, PLASMA: Lactic Acid, Venous: >9 mmol/L (ref 0.5–1.9)

## 2022-10-25 MED ORDER — VITAMIN K1 10 MG/ML IJ SOLN
10.0000 mg | Freq: Once | INTRAVENOUS | Status: AC
  Administered 2022-10-25: 10 mg via INTRAVENOUS
  Filled 2022-10-25: qty 1

## 2022-10-25 MED ORDER — SODIUM CHLORIDE 0.9% IV SOLUTION: Freq: Once | INTRAVENOUS | Status: AC

## 2022-10-25 MED ORDER — SODIUM BICARBONATE 8.4 % IV SOLN
100.0000 meq | Freq: Once | INTRAVENOUS | Status: AC
  Administered 2022-10-25: 100 meq via INTRAVENOUS

## 2022-10-25 MED ORDER — HYDROCORTISONE SOD SUC (PF) 100 MG IJ SOLR
INTRAMUSCULAR | Status: AC
  Filled 2022-10-25: qty 2

## 2022-10-25 MED ORDER — HYDROCORTISONE SOD SUC (PF) 100 MG IJ SOLR
100.0000 mg | Freq: Three times a day (TID) | INTRAMUSCULAR | Status: DC
  Administered 2022-10-25: 100 mg via INTRAVENOUS

## 2022-10-25 MED ORDER — SODIUM ZIRCONIUM CYCLOSILICATE 10 G PO PACK
10.0000 g | PACK | Freq: Once | ORAL | Status: AC
  Administered 2022-10-25: 10 g
  Filled 2022-10-25: qty 1

## 2022-10-26 LAB — BPAM PLATELET PHERESIS
Blood Product Expiration Date: 202402092359
ISSUE DATE / TIME: 202402090053
Unit Type and Rh: 5100

## 2022-10-26 LAB — BPAM CRYOPRECIPITATE
Blood Product Expiration Date: 202402122359
ISSUE DATE / TIME: 202402090124
Unit Type and Rh: 6200

## 2022-10-26 LAB — PREPARE CRYOPRECIPITATE: Unit division: 0

## 2022-10-26 LAB — PREPARE PLATELET PHERESIS: Unit division: 0

## 2022-10-26 LAB — PREPARE FRESH FROZEN PLASMA: Unit division: 0

## 2022-10-26 LAB — BPAM FFP
Blood Product Expiration Date: 202402142359
ISSUE DATE / TIME: 202402090155
Unit Type and Rh: 7300

## 2022-10-27 LAB — CULTURE, BLOOD (ROUTINE X 2)

## 2022-10-28 ENCOUNTER — Inpatient Hospital Stay: Payer: Medicare Other

## 2022-10-29 ENCOUNTER — Inpatient Hospital Stay: Payer: Medicare Other

## 2022-10-29 ENCOUNTER — Ambulatory Visit: Payer: Medicare Other

## 2022-10-29 ENCOUNTER — Inpatient Hospital Stay: Payer: Medicare Other | Admitting: Internal Medicine

## 2022-10-29 ENCOUNTER — Other Ambulatory Visit: Payer: Medicare Other

## 2022-10-29 ENCOUNTER — Ambulatory Visit: Payer: Medicare Other | Admitting: Internal Medicine

## 2022-10-29 LAB — CULTURE, BLOOD (ROUTINE X 2): Special Requests: ADEQUATE

## 2022-10-29 LAB — PATHOLOGIST SMEAR REVIEW

## 2022-11-15 NOTE — Death Summary Note (Signed)
DEATH SUMMARY   Patient Details  Name: Louis Sullivan MRN: [id number] DOB: [birth year]  Admission/Discharge Information   Admit Date:  11/21/2022  Date of Death: Date of Death: 2022-11-22  Time of Death: Time of Death: 0250  Length of Stay: 1  Referring Physician: [place], [place]   Reason(s) for Hospitalization  S/p PEA cardiac arrest Acute septic/anoxic encephalopathy Profound shock, likely septic Acute hypoxic/hypercapnic respiratory failure Aspiration pneumonia Acute kidney injury due to ischemic ATN Mixed metabolic and respiratory acidosis Hyperkalemia Shock liver Diabetes type 2 with hypoglycemia Demand cardiac ischemia Anemia of chronic disease  Diagnoses  Preliminary cause of death: Multisystem organ failure in the setting of severe septic shock who was ordered 2 A of bicarb she is CRRT if we can deploy Secondary Diagnoses (including complications and co-morbidities):  Principal Problem:   Cardiac arrest ([place]) Active Problems:   Shock ([place])   Acute respiratory failure with hypercapnia ([place])   AKI (acute kidney injury) ([place])   Hyperkalemia   Shock liver   Goals of care, counseling/discussion   Brief Hospital Course (including significant findings, care, treatment, and services provided and events leading to death)  Louis Sullivan is a 72 y.o. year old male who 72 yo M PMH Stg 3b lung cancer (s/p carboplatin/paclitaxel + XRT 02/19/22-04/09/22, now on durvalumab q2wk started 06/26/22), multivessel CAD, COPD, prior smoking who presented to ED 11/21/22 following an out of hospital cardiac arrest. Arrest was witnessed at home. On EMS arrival, was PEA. Initially ROSC after about 15 minutes, but became pulseless multiple times in transport and required CPR on and off for about 60 minutes -- non-shockable throughout.   In the ED an ETT was placed. There was no sedation or NMB given for this.  Shortly after arrival in ED, pt lost pulses again, asystole. ROSC after about 10-15  minutes in ED.    After ROSC pt in shock   ECG with abnormal T waves concerning for inferior ischemia as well as a possible acute anteroseptal infarct. EMP discussed with cardiology who determined patient was not a candidate for acute intervention.   Hypoglycemic in ED with CBG 32 -- received mulitple amps of D50 for this with follow up CBG approx 130   Patient was admitted to ICU He was continued on vasopressor support, his MAP was keep coming down, required multiple vasopressors including Levophed, vasopressin and epinephrine at max doses, remains on mechanical ventilator, vent setting was adjusted to clear hypercapnia he remains in severe metabolic acidosis as he developed severe anuric acute renal failure due to ischemic/septic ATN, received multiple boluses of bicarbonate, bicarbonate infusion was continued, he was on IV antibiotic therapy despite aggressive pulm support medical management patient continued to get clinically worse and went to multisystem organ failure, goals of care discussions were carried with family, they decided to keep him DNR, patient became asystolic on 01/21/8468 at 6:29 AM.  Patient's family was at bedside    Pertinent Labs and Studies  Significant Diagnostic Studies ECHOCARDIOGRAM LIMITED  Result Date: 11/21/22    ECHOCARDIOGRAM LIMITED REPORT   Patient Name:   Louis Sullivan Date of Exam: November 21, 2022 Medical Rec #:  [id number]        Height:       69.0 in Accession #:    [id number]       Weight:       212.5 lb Date of Birth:  [birth year]        BSA:  2.120 m Patient Age:    80 years         BP:           117/86 mmHg Patient Gender: M                HR:           112 bpm. Exam Location:  Inpatient Procedure: Cardiac Doppler, Color Doppler, Limited Echo and Intracardiac            Opacification Agent Indications:    Cardiac arrest  History:        Patient has prior history of Echocardiogram examinations, most                 recent 05/15/2022. CAD, COPD; Risk  Factors:Diabetes,                 Dyslipidemia, Hypertension and Former Smoker.  Sonographer:    Clayton Lefort RDCS (AE) Referring Phys: [id number] GRACE E BOWSER  Sonographer Comments: Technically difficult study due to poor echo windows and patient is obese. Image acquisition challenging due to patient body habitus. IMPRESSIONS  1. Left ventricular ejection fraction, by estimation, is <20%. The left ventricle has severely decreased function. The left ventricle demonstrates global hypokinesis. There is mild left ventricular hypertrophy.  2. Right ventricular systolic function is severely reduced. The right ventricular size is normal.  3. The mitral valve is normal in structure. Mild mitral valve regurgitation. No evidence of mitral stenosis.  4. The aortic valve is tricuspid. Aortic valve regurgitation is not visualized. Aortic valve sclerosis is present, with no evidence of aortic valve stenosis.  5. The inferior vena cava is normal in size with greater than 50% respiratory variability, suggesting right atrial pressure of 3 mmHg. FINDINGS  Left Ventricle: Left ventricular ejection fraction, by estimation, is <20%. The left ventricle has severely decreased function. The left ventricle demonstrates global hypokinesis. Definity contrast agent was given IV to delineate the left ventricular endocardial borders. The left ventricular internal cavity size was normal in size. There is mild left ventricular hypertrophy. Right Ventricle: The right ventricular size is normal. Right ventricular systolic function is severely reduced. Left Atrium: Left atrial size was normal in size. Right Atrium: Right atrial size was normal in size. Pericardium: Trivial pericardial effusion is present. Mitral Valve: The mitral valve is normal in structure. Mild mitral valve regurgitation. No evidence of mitral valve stenosis. Tricuspid Valve: The tricuspid valve is normal in structure. Tricuspid valve regurgitation is trivial. No evidence of  tricuspid stenosis. Aortic Valve: The aortic valve is tricuspid. Aortic valve regurgitation is not visualized. Aortic valve sclerosis is present, with no evidence of aortic valve stenosis. Pulmonic Valve: The pulmonic valve was normal in structure. Pulmonic valve regurgitation is not visualized. No evidence of pulmonic stenosis. Aorta: The aortic root is normal in size and structure. Venous: The inferior vena cava is normal in size with greater than 50% respiratory variability, suggesting right atrial pressure of 3 mmHg. IAS/Shunts: The interatrial septum was not well visualized. Additional Comments: Spectral Doppler performed. Color Doppler performed.  LEFT VENTRICLE PLAX 2D LVIDd:         4.00 cm LVIDs:         3.80 cm LV PW:         1.50 cm LV IVS:        1.70 cm LVOT diam:     1.90 cm LVOT Area:     2.84 cm  IVC IVC diam: 2.10 cm  LEFT ATRIUM         Index LA diam:    3.70 cm 1.75 cm/m   AORTA Ao Root diam: 3.60 cm TRICUSPID VALVE TR Peak grad:   19.5 mmHg TR Vmax:        221.00 cm/s  SHUNTS Systemic Diam: 1.90 cm Kirk Ruths MD Electronically signed by Kirk Ruths MD Signature Date/Time: 10/29/2022/6:03:19 PM    Final    DG CHEST PORT 1 VIEW  Result Date: 10/20/2022 CLINICAL DATA:  Acute hypercapnic respiratory failure. EXAM: PORTABLE CHEST 1 VIEW COMPARISON:  Same day. FINDINGS: Stable cardiomediastinal silhouette. Endotracheal tube in grossly good position. Right-sided Port-A-Cath is unchanged. No acute pulmonary disease is noted. Bony thorax is unremarkable. IMPRESSION: Stable support apparatus.  No acute abnormality seen. Electronically Signed   By: Marijo Conception M.D.   On: 10/28/2022 17:39   CT Head Wo Contrast  Result Date: 10/20/2022 CLINICAL DATA:  Mental status change, unknown cause post cardiac arrest EXAM: CT HEAD WITHOUT CONTRAST TECHNIQUE: Contiguous axial images were obtained from the base of the skull through the vertex without intravenous contrast. RADIATION DOSE REDUCTION: This  exam was performed according to the departmental dose-optimization program which includes automated exposure control, adjustment of the mA and/or kV according to patient size and/or use of iterative reconstruction technique. COMPARISON:  02/04/2022 FINDINGS: Brain: No evidence of acute infarction, hemorrhage, hydrocephalus, extra-axial collection or mass lesion/mass effect. Vascular: Atherosclerotic calcifications involving the large vessels of the skull base. No unexpected hyperdense vessel. Skull: Normal. Negative for fracture or focal lesion. Sinuses/Orbits: No acute finding. Other: None. IMPRESSION: No acute intracranial abnormality. Electronically Signed   By: Davina Poke D.O.   On: 10/28/2022 15:52   DG Chest Port 1 View  Result Date: 10/21/2022 CLINICAL DATA:  Status post cardiac arrest. EXAM: PORTABLE CHEST 1 VIEW COMPARISON:  Jan 23, 2022. FINDINGS: Stable cardiomediastinal silhouette. Endotracheal tube is in grossly good position. Right internal jugular Port-A-Cath is noted with distal tip in expected position of cavoatrial junction. Lungs are clear. Bony thorax is unremarkable. IMPRESSION: Endotracheal tube in grossly good position. No acute cardiopulmonary abnormality seen. Electronically Signed   By: Marijo Conception M.D.   On: 10/29/2022 14:32    Microbiology Recent Results (from the past 240 hour(s))  Resp panel by RT-PCR (RSV, Flu A&B, Covid) Anterior Nasal Swab     Status: None   Collection Time: 11/12/2022  1:37 PM   Specimen: Anterior Nasal Swab  Result Value Ref Range Status   SARS Coronavirus 2 by RT PCR NEGATIVE NEGATIVE Final   Influenza A by PCR NEGATIVE NEGATIVE Final   Influenza B by PCR NEGATIVE NEGATIVE Final    Comment: (NOTE) The Xpert Xpress SARS-CoV-2/FLU/RSV plus assay is intended as an aid in the diagnosis of influenza from Nasopharyngeal swab specimens and should not be used as a sole basis for treatment. Nasal washings and aspirates are unacceptable for Xpert  Xpress SARS-CoV-2/FLU/RSV testing.  Fact Sheet for Patients: EntrepreneurPulse.com.au  Fact Sheet for Healthcare Providers: IncredibleEmployment.be  This test is not yet approved or cleared by the [place] FDA and has been authorized for detection and/or diagnosis of SARS-CoV-2 by FDA under an Emergency Use Authorization (EUA). This EUA will remain in effect (meaning this test can be used) for the duration of the COVID-19 declaration under Section 564(b)(1) of the Act, 21 U.S.C. section 360bbb-3(b)(1), unless the authorization is terminated or revoked.     Resp Syncytial Virus by PCR NEGATIVE NEGATIVE Final  Comment: (NOTE) Fact Sheet for Patients: EntrepreneurPulse.com.au  Fact Sheet for Healthcare Providers: IncredibleEmployment.be  This test is not yet approved or cleared by the [place] FDA and has been authorized for detection and/or diagnosis of SARS-CoV-2 by FDA under an Emergency Use Authorization (EUA). This EUA will remain in effect (meaning this test can be used) for the duration of the COVID-19 declaration under Section 564(b)(1) of the Act, 21 U.S.C. section 360bbb-3(b)(1), unless the authorization is terminated or revoked.  Performed at [facility], [place] [address] [place], [place] [address]   Culture, blood (Routine X 2) w Reflex to ID Panel     Status: None (Preliminary result)   Collection Time: 10/29/2022  6:25 PM   Specimen: BLOOD RIGHT HAND  Result Value Ref Range Status   Specimen Description BLOOD RIGHT HAND  Final   Special Requests   Final    BOTTLES DRAWN AEROBIC AND ANAEROBIC Blood Culture adequate volume PATIENT ON AMP   Culture  Setup Time   Final    GRAM NEGATIVE RODS ANAEROBIC BOTTLE ONLY Organism ID to follow Performed at [facility], [place] [address] [place], [place] [address]    Culture GRAM NEGATIVE RODS  Final   Report Status PENDING   Incomplete  Culture, blood (Routine X 2) w Reflex to ID Panel     Status: None (Preliminary result)   Collection Time: 10/31/2022  6:25 PM   Specimen: BLOOD RIGHT HAND  Result Value Ref Range Status   Specimen Description BLOOD RIGHT HAND  Final   Special Requests   Final    BOTTLES DRAWN AEROBIC AND ANAEROBIC Blood Culture adequate volume   Culture   Final    NO GROWTH < 24 HOURS Performed at [facility], [place] [address] [place], [place] [address]    Report Status PENDING  Incomplete    Lab Basic Metabolic Panel: Recent Labs  Lab 10/29/2022 1348 11/10/2022 1407 11/08/2022 1410 10/18/2022 1642 10/17/2022 1926 10/23/2022 2140 11/07/2022 2257 11-14-22 0009 2022/11/14 0014  NA 139   < > 141   < > 138 135 143 162* 150*  K 5.6*   < > 6.7*   < > >7.5* 8.1* 8.1* >7.5* >8.5*  CL 108  --  99  --  92*  --   --  84*  --   CO2  --   --  14*  --  11*  --   --  25  --   GLUCOSE 32*  --  396*  --  406*  --   --  201*  --   BUN 51*  --  54*  --  55*  --   --  52*  --   CREATININE 6.10*  --  6.32*  --  6.70*  --   --  6.57*  --   CALCIUM  --   --  9.4  --  8.0*  --   --  6.4*  --   MG  --   --  3.2*  --   --   --   --   --   --    < > = values in this interval not displayed.   Liver Function Tests: Recent Labs  Lab 11/04/2022 1410 10/23/2022 1926  AST 1,107* 2,959*  ALT 704* 1,553*  ALKPHOS 244* 348*  BILITOT 1.0 1.9*  PROT 3.7* 4.0*  ALBUMIN 1.6* 1.7*   No results for input(s): "LIPASE", "AMYLASE" in the last  168 hours. No results for input(s): "AMMONIA" in the last 168 hours. CBC: Recent Labs  Lab 10/22/2022 1410 11/10/2022 1642 11/07/2022 1926 11/05/2022 2140 11/08/2022 2257 11/11/2022 2324 2022/11/11 0014  WBC 8.5  --  10.3  --   --   --   --   NEUTROABS 5.3  --   --   --   --   --   --   HGB 9.6* 11.6* 8.9* 7.8* 7.1*  --  6.5*  HCT 33.5* 34.0* 31.1* 23.0* 21.0*  --  19.0*  MCV 102.8*  --  102.6*  --   --   --   --   PLT 45*  --  38*  --   --  24*  --    Cardiac Enzymes: No results for  input(s): "CKTOTAL", "CKMB", "CKMBINDEX", "TROPONINI" in the last 168 hours. Sepsis Labs: Recent Labs  Lab 10/29/2022 1410 11/08/2022 1411 10/23/2022 1646 11/10/2022 1926 11/05/2022 2300 11/11/2022 0009  WBC 8.5  --   --  10.3  --   --   LATICACIDVEN  --  >9.0* >9.0*  --  >9.0* >9.0*    Procedures/Operations     Jaylyne Breese 11-Nov-2022, 2:13 PM

## 2022-11-15 NOTE — Progress Notes (Signed)
[place] Progress Note Patient Name: Louis Sullivan DOB: 1950/10/04 MRN: [id number]   Date of Service  11/12/22  HPI/Events of Note  Hyperkalemia - K+ remains > 8.5 on iSTAT ABG. Patient is on a insulin IV infusion, NaHCO3 IV infusion and has received several amps of NaHCO3 and multiple doses of Calcium gluconate over the course of the evening. PCCM ground team aware and will speak with family again about [place]. I spoke with family about the dismal prognosis earlier and daughter, [name], was not able to make a decision about limiting care and wanted to discuss the situation further with other family members. Patient remains too coagulopathic to safely place dialysis access and too hemodynamically unstable to even tolerated CRRT at this time.   eICU Interventions  Plan: Lokelma 10 gm per tube now.      Intervention Category Major Interventions: Electrolyte abnormality - evaluation and management  Joelly Bolanos [place] November 12, 2022, 1:43 AM

## 2022-11-15 NOTE — IPAL (Signed)
  Interdisciplinary Goals of Care Family Meeting   Date carried out: 10/29/22  Location of the meeting: Bedside  Member's involved: Physician, PA-C, Bedside Registered Nurse, and Family Member or next of kin  Durable Power of Attorney or acting medical decision maker: Patient's daughter, Louis Sullivan  Discussion: We discussed goals of care for Louis Sullivan .  Dr. [name] with PCCM discussed [name]'s current clinical status at length with [name] and another family member at bedside. Discussed the ongoing decline of [name]'s blood pressure despite multiple medications and the profound and persistent acidosis and electrolyte abnormalities that have continued despite all available interventions. We discussed that we are unable to safely offer renal replacement therapy as an intervention, as [name] would likely not tolerate CRRT and we fear he may not even tolerate temporary dialysis access placement to even possibly facilitate this. At this point, we have reached the limit of available interventions to prolong [name]'s life. Dr. [name] and [name], RN conveyed to [name] that CPR at this point would likely cause harm to [name] and would not provide benefit. It was the recommendation of the team that if [name] were to stop, we would not complete CPR and would let him pass naturally. [name] was in agreement with this decision and DNR code status was placed.  Code status:   Code Status: DNR   Disposition: Continue current acute care; do not complete CPR/ACLS if patient codes  Time spent for the meeting: 20 minutes   Lestine Mount, PA-C  Oct 29, 2022, 2:17 AM

## 2022-11-15 NOTE — Progress Notes (Signed)
Ground team to bedside to speak to family. While ground team at bedside multiple critical lab results called in and reported to ground team

## 2022-11-15 NOTE — Progress Notes (Signed)
[place] Progress Note Patient Name: Louis Sullivan DOB: March 03, 1951 MRN: [id number]   Date of Service  10/28/2022  HPI/Events of Note  DIC - Coagulation labs c/w DIC: PT/INR = 64.8/7.7, PTT = 85, Fibrinogen = < 60 and platelets = 24. Already receiving platelets and Vitamin K.   eICU Interventions  Plan: Cryoprecipitate 1 pool IV now.  Repeat DIC panel at 6 AM.     Intervention Category Major Interventions: Other:  Cherity Blickenstaff Cornelia Copa 28-Oct-2022, 1:36 AM

## 2022-11-15 NOTE — Progress Notes (Signed)
[place] Progress Note Patient Name: Louis Sullivan DOB: Jan 23, 1951 MRN: [id number]   Date of Service  11-07-22  HPI/Events of Note  Thrombocytopenia - Platelets = 24. Oozing from L subclavian central line site.   eICU Interventions  Plan: Transfuse 1 unit single donor platelets now.      Intervention Category Major Interventions: Other:  Tishia Maestre Cornelia Copa November 07, 2022, 12:06 AM

## 2022-11-15 NOTE — Procedures (Signed)
Patient Name: ALONDRA SAHNI  MRN: [id number]  Epilepsy Attending: Lora Havens  Referring Physician/Provider: Cristal Generous, NP  Duration: 11/04/2022 1941 to 0300  Patient history: 72yo M s/p cardiac arrest. EEG to evaluate for seizure  Level of alertness:  comatose  AEDs during EEG study: None  Technical aspects: This EEG study was done with scalp electrodes positioned according to the 10-20 International system of electrode placement. Electrical activity was reviewed with band pass filter of 1-70Hz , sensitivity of 7 uV/mm, display speed of 64mm/sec with a 60Hz  notched filter applied as appropriate. EEG data were recorded continuously and digitally stored.  Video monitoring was available and reviewed as appropriate.  Description: EEG initially showed near continuous generalized suppression admixed with generalized low amplitude 12-14hz  beta activity. Gradually eeg worsened and showed continuous background suppression not reactive to stimulation. EKG showed asystole on 11-19-22 at 0248 and patient died.  Hyperventilation and photic stimulation were not performed.     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 EKG showed asystole on 11-19-2022 at 0248 and patient died.   Jalene Lacko Barbra Sarks

## 2022-11-15 NOTE — Progress Notes (Signed)
Pt DNR. Daughter and another relative at bedside.   Around 0250 it was noted that pt was asystole on the monitor. Assessed pt with Hardin Negus, RN. Pt with no pulse and no heart beat. Time of death called at 33.  Support given to family.

## 2022-11-15 NOTE — Progress Notes (Signed)
   November 15, 2022 0100  Spiritual Encounters  Type of Visit Initial  Care provided to: [facility] partners present during encounter Nurse  Referral source Code page  Reason for visit End-of-life  OnCall Visit Yes  Spiritual Framework  Presenting Themes Meaning/purpose/sources of inspiration  Family Stress Factors Loss  Interventions  Spiritual Care Interventions Made Compassionate presence;Reflective listening   Ch responded to code page for EOL. Pt daughter and sister-in-law was at the bedside. They are overwhelm with the current prognosis. Ch provided emotional and spiritual support. Family will page Ch if they need more help.

## 2022-11-15 DEATH — deceased

## 2022-11-28 ENCOUNTER — Inpatient Hospital Stay: Payer: Medicare Other

## 2022-12-12 ENCOUNTER — Ambulatory Visit: Payer: Medicare Other | Admitting: Cardiology

## 2023-01-23 ENCOUNTER — Inpatient Hospital Stay: Payer: Medicare Other

## 2023-03-17 ENCOUNTER — Inpatient Hospital Stay: Payer: Medicare Other

## 2023-03-28 ENCOUNTER — Other Ambulatory Visit: Payer: Medicare Other

## 2023-04-03 ENCOUNTER — Ambulatory Visit: Payer: Medicare Other | Admitting: Radiation Oncology
